# Patient Record
Sex: Female | Born: 1985 | Race: Black or African American | Hispanic: No | Marital: Single | Smoking: Never smoker
Health system: Southern US, Community
[De-identification: ages and names within clinical notes are randomized; demographics above are authoritative.]

## PROBLEM LIST (undated history)

## (undated) ENCOUNTER — Emergency Department (HOSPITAL_COMMUNITY): Discharge status: Absent without leave | Payer: Self-pay

## (undated) DIAGNOSIS — B3731 Acute candidiasis of vulva and vagina: Secondary | ICD-10-CM

## (undated) DIAGNOSIS — N63 Unspecified lump in unspecified breast: Secondary | ICD-10-CM

## (undated) DIAGNOSIS — T7840XA Allergy, unspecified, initial encounter: Secondary | ICD-10-CM

## (undated) DIAGNOSIS — R87629 Unspecified abnormal cytological findings in specimens from vagina: Secondary | ICD-10-CM

## (undated) DIAGNOSIS — K047 Periapical abscess without sinus: Secondary | ICD-10-CM

## (undated) DIAGNOSIS — J40 Bronchitis, not specified as acute or chronic: Secondary | ICD-10-CM

## (undated) DIAGNOSIS — B999 Unspecified infectious disease: Secondary | ICD-10-CM

## (undated) DIAGNOSIS — F32A Depression, unspecified: Secondary | ICD-10-CM

## (undated) DIAGNOSIS — F329 Major depressive disorder, single episode, unspecified: Secondary | ICD-10-CM

## (undated) DIAGNOSIS — R519 Headache, unspecified: Secondary | ICD-10-CM

## (undated) DIAGNOSIS — R51 Headache: Secondary | ICD-10-CM

## (undated) DIAGNOSIS — N760 Acute vaginitis: Principal | ICD-10-CM

## (undated) DIAGNOSIS — B373 Candidiasis of vulva and vagina: Secondary | ICD-10-CM

## (undated) DIAGNOSIS — B9689 Other specified bacterial agents as the cause of diseases classified elsewhere: Secondary | ICD-10-CM

## (undated) DIAGNOSIS — J302 Other seasonal allergic rhinitis: Secondary | ICD-10-CM

## (undated) DIAGNOSIS — B379 Candidiasis, unspecified: Secondary | ICD-10-CM

## (undated) DIAGNOSIS — F419 Anxiety disorder, unspecified: Secondary | ICD-10-CM

## (undated) DIAGNOSIS — H5789 Other specified disorders of eye and adnexa: Secondary | ICD-10-CM

## (undated) HISTORY — PX: WISDOM TOOTH EXTRACTION: SHX21

## (undated) HISTORY — PX: HIP SURGERY: SHX245

## (undated) HISTORY — DX: Allergy, unspecified, initial encounter: T78.40XA

---
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
## 2006-06-07 ENCOUNTER — Emergency Department (HOSPITAL_COMMUNITY): Admission: EM | Admit: 2006-06-07 | Discharge: 2006-06-07 | Payer: Self-pay | Admitting: Family Medicine

## 2006-08-16 ENCOUNTER — Emergency Department (HOSPITAL_COMMUNITY): Admission: EM | Admit: 2006-08-16 | Discharge: 2006-08-16 | Payer: Self-pay | Admitting: Emergency Medicine

## 2006-10-03 ENCOUNTER — Emergency Department (HOSPITAL_COMMUNITY): Admission: EM | Admit: 2006-10-03 | Discharge: 2006-10-03 | Payer: Self-pay | Admitting: Family Medicine

## 2006-11-24 ENCOUNTER — Emergency Department (HOSPITAL_COMMUNITY): Admission: EM | Admit: 2006-11-24 | Discharge: 2006-11-25 | Payer: Self-pay | Admitting: Emergency Medicine

## 2006-12-28 ENCOUNTER — Emergency Department (HOSPITAL_COMMUNITY): Admission: EM | Admit: 2006-12-28 | Discharge: 2006-12-28 | Payer: Self-pay | Admitting: Emergency Medicine

## 2007-04-10 ENCOUNTER — Emergency Department (HOSPITAL_COMMUNITY): Admission: EM | Admit: 2007-04-10 | Discharge: 2007-04-10 | Payer: Self-pay | Admitting: Emergency Medicine

## 2007-04-19 ENCOUNTER — Emergency Department (HOSPITAL_COMMUNITY): Admission: EM | Admit: 2007-04-19 | Discharge: 2007-04-19 | Payer: Self-pay | Admitting: Family Medicine

## 2007-05-07 ENCOUNTER — Emergency Department (HOSPITAL_COMMUNITY): Admission: EM | Admit: 2007-05-07 | Discharge: 2007-05-07 | Payer: Self-pay | Admitting: Family Medicine

## 2007-12-11 ENCOUNTER — Emergency Department (HOSPITAL_COMMUNITY): Admission: EM | Admit: 2007-12-11 | Discharge: 2007-12-11 | Payer: Self-pay | Admitting: Family Medicine

## 2007-12-27 ENCOUNTER — Emergency Department (HOSPITAL_COMMUNITY): Admission: EM | Admit: 2007-12-27 | Discharge: 2007-12-27 | Payer: Self-pay | Admitting: Family Medicine

## 2008-03-17 ENCOUNTER — Emergency Department (HOSPITAL_COMMUNITY): Admission: EM | Admit: 2008-03-17 | Discharge: 2008-03-17 | Payer: Self-pay | Admitting: Family Medicine

## 2008-04-29 ENCOUNTER — Inpatient Hospital Stay (HOSPITAL_COMMUNITY): Admission: AD | Admit: 2008-04-29 | Discharge: 2008-04-29 | Payer: Self-pay | Admitting: Obstetrics

## 2008-06-16 ENCOUNTER — Emergency Department (HOSPITAL_COMMUNITY): Admission: EM | Admit: 2008-06-16 | Discharge: 2008-06-16 | Payer: Self-pay | Admitting: Emergency Medicine

## 2008-07-16 ENCOUNTER — Emergency Department (HOSPITAL_COMMUNITY): Admission: EM | Admit: 2008-07-16 | Discharge: 2008-07-16 | Payer: Self-pay | Admitting: Family Medicine

## 2008-10-02 ENCOUNTER — Inpatient Hospital Stay (HOSPITAL_COMMUNITY): Admission: AD | Admit: 2008-10-02 | Discharge: 2008-10-02 | Payer: Self-pay | Admitting: Obstetrics & Gynecology

## 2009-04-19 ENCOUNTER — Emergency Department (HOSPITAL_COMMUNITY): Admission: EM | Admit: 2009-04-19 | Discharge: 2009-04-19 | Payer: Self-pay | Admitting: Family Medicine

## 2009-07-21 ENCOUNTER — Emergency Department (HOSPITAL_COMMUNITY): Admission: EM | Admit: 2009-07-21 | Discharge: 2009-07-21 | Payer: Self-pay | Admitting: Emergency Medicine

## 2009-10-19 ENCOUNTER — Emergency Department (HOSPITAL_COMMUNITY): Admission: EM | Admit: 2009-10-19 | Discharge: 2009-10-19 | Payer: Self-pay | Admitting: Emergency Medicine

## 2009-12-14 ENCOUNTER — Emergency Department (HOSPITAL_COMMUNITY): Admission: EM | Admit: 2009-12-14 | Discharge: 2009-12-14 | Payer: Self-pay | Admitting: Family Medicine

## 2010-02-05 ENCOUNTER — Emergency Department (HOSPITAL_COMMUNITY): Admission: EM | Admit: 2010-02-05 | Discharge: 2010-02-05 | Payer: Self-pay | Admitting: Emergency Medicine

## 2010-07-02 ENCOUNTER — Emergency Department (HOSPITAL_COMMUNITY)
Admission: EM | Admit: 2010-07-02 | Discharge: 2010-07-02 | Payer: Self-pay | Source: Home / Self Care | Admitting: Emergency Medicine

## 2010-07-09 ENCOUNTER — Inpatient Hospital Stay (HOSPITAL_COMMUNITY)
Admission: AD | Admit: 2010-07-09 | Discharge: 2010-07-09 | Payer: Self-pay | Source: Home / Self Care | Attending: Obstetrics & Gynecology | Admitting: Obstetrics & Gynecology

## 2010-07-10 ENCOUNTER — Ambulatory Visit: Admit: 2010-07-10 | Payer: Self-pay

## 2010-08-15 ENCOUNTER — Other Ambulatory Visit: Payer: Self-pay | Admitting: Family Medicine

## 2010-08-15 ENCOUNTER — Encounter (INDEPENDENT_AMBULATORY_CARE_PROVIDER_SITE_OTHER): Payer: Self-pay | Admitting: Family Medicine

## 2010-08-15 ENCOUNTER — Other Ambulatory Visit (HOSPITAL_COMMUNITY)
Admission: RE | Admit: 2010-08-15 | Discharge: 2010-08-15 | Disposition: A | Discharge status: Home / Self Care | Payer: Self-pay | Source: Ambulatory Visit | Attending: Family Medicine | Admitting: Family Medicine

## 2010-08-15 DIAGNOSIS — Z01419 Encounter for gynecological examination (general) (routine) without abnormal findings: Secondary | ICD-10-CM

## 2010-08-15 DIAGNOSIS — B977 Papillomavirus as the cause of diseases classified elsewhere: Secondary | ICD-10-CM | POA: Insufficient documentation

## 2010-08-15 DIAGNOSIS — N841 Polyp of cervix uteri: Secondary | ICD-10-CM

## 2010-08-15 DIAGNOSIS — N870 Mild cervical dysplasia: Secondary | ICD-10-CM | POA: Insufficient documentation

## 2010-08-15 DIAGNOSIS — N893 Dysplasia of vagina, unspecified: Secondary | ICD-10-CM | POA: Insufficient documentation

## 2010-08-15 LAB — POCT PREGNANCY, URINE: Preg Test, Ur: NEGATIVE

## 2010-09-09 ENCOUNTER — Encounter (INDEPENDENT_AMBULATORY_CARE_PROVIDER_SITE_OTHER): Payer: Self-pay | Admitting: Obstetrics & Gynecology

## 2010-09-09 ENCOUNTER — Other Ambulatory Visit (HOSPITAL_COMMUNITY)
Admission: RE | Admit: 2010-09-09 | Discharge: 2010-09-09 | Disposition: A | Discharge status: Home / Self Care | Payer: Self-pay | Source: Ambulatory Visit | Attending: Family Medicine | Admitting: Family Medicine

## 2010-09-09 ENCOUNTER — Other Ambulatory Visit: Payer: Self-pay | Admitting: Obstetrics & Gynecology

## 2010-09-09 DIAGNOSIS — R87610 Atypical squamous cells of undetermined significance on cytologic smear of cervix (ASC-US): Secondary | ICD-10-CM

## 2010-09-09 DIAGNOSIS — N870 Mild cervical dysplasia: Secondary | ICD-10-CM | POA: Insufficient documentation

## 2010-09-09 LAB — POCT PREGNANCY, URINE: Preg Test, Ur: NEGATIVE

## 2010-09-10 ENCOUNTER — Other Ambulatory Visit: Payer: Self-pay | Admitting: Obstetrics & Gynecology

## 2010-09-16 LAB — POCT URINALYSIS DIPSTICK
Bilirubin Urine: NEGATIVE
Specific Gravity, Urine: >=1.03 (ref 1.005–1.030)

## 2010-09-16 LAB — POCT PREGNANCY, URINE: Preg Test, Ur: NEGATIVE

## 2010-09-16 LAB — WET PREP, GENITAL

## 2010-09-16 LAB — GC/CHLAMYDIA PROBE AMP, GENITAL: GC Probe Amp, Genital: NEGATIVE

## 2010-09-22 LAB — WET PREP, GENITAL: Trich, Wet Prep: NONE SEEN

## 2010-09-22 LAB — URINALYSIS, ROUTINE W REFLEX MICROSCOPIC
Glucose, UA: NEGATIVE mg/dL
Urobilinogen, UA: 0.2 mg/dL (ref 0.0–1.0)
pH: 6 (ref 5.0–8.0)

## 2010-09-22 LAB — URINE MICROSCOPIC-ADD ON

## 2010-09-22 LAB — GC/CHLAMYDIA PROBE AMP, GENITAL
Chlamydia, DNA Probe: NEGATIVE
GC Probe Amp, Genital: NEGATIVE

## 2010-09-23 LAB — WET PREP, GENITAL
Trich, Wet Prep: NONE SEEN
Yeast Wet Prep HPF POC: NONE SEEN

## 2010-09-23 LAB — GC/CHLAMYDIA PROBE AMP, GENITAL: Chlamydia, DNA Probe: NEGATIVE

## 2010-09-23 LAB — POCT URINALYSIS DIP (DEVICE)
Bilirubin Urine: NEGATIVE
Glucose, UA: NEGATIVE mg/dL

## 2010-09-23 LAB — POCT PREGNANCY, URINE: Preg Test, Ur: NEGATIVE

## 2010-09-25 ENCOUNTER — Ambulatory Visit: Payer: Self-pay | Admitting: Obstetrics and Gynecology

## 2010-09-27 NOTE — Progress Notes (Signed)
NAME:  Brenda West, Brenda West NO.:  [id number]  MEDICAL RECORD NO.:  [id number]           PATIENT TYPE:  A  LOCATION:  [facility]                   FACILITY:  [facility]  PHYSICIAN:  Tinnie Gens, MD        DATE OF BIRTH:  June 03, 1986  DATE OF SERVICE:  08/15/2010                                 CLINIC NOTE  CHIEF COMPLAINT:  Cervical polyp.  HISTORY OF PRESENT ILLNESS:  The patient is a 25 year old nulliparous patient who comes in today for cervical polyp.  She states she was seen at the [facility] and found to have a cervical polyp and she was told to come here to have it removed.  She is, otherwise, without complaints.  She does have some irregular cycles since she got off Depo approximately 2 years ago, and she was amenorrheic for quite some time prior to that.  She currently uses condoms for birth control and is not interested in anything more than that.  PAST MEDICAL HISTORY:  Significant for allergic rhinitis, eczema, and bacterial vaginosis.  FAMILY HISTORY:  History of hypertension and cirrhosis.  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  They got treated at age 42.  She denies drug use.  She is a social alcohol user and a former smoker.  She is currently trying to get disability.  MEDICATIONS:  The patient is on Zyrtec and triamcinolone.  ALLERGIES:  None known.  OBSTETRICAL HISTORY:  G0.  GYNECOLOGICAL HISTORY:  No history of abnormal Pap smear.  PHYSICAL EXAMINATION:  VITAL SIGN:  As noted in the chart. GENERAL:  She is a well-developed, well-nourished female in no acute distress. HEENT:  Normocephalic, atraumatic.  Sclerae anicteric. NECK:  Supple.  Normal thyroid. LUNGS:  Clear. CV:  Regular.  ABDOMEN:  Soft, nontender, nondistended. GU:  Normal external female genitalia.  BUS normal.  Vagina is pink and rugated.  Cervix is nulliparous.  There is a fleshy polyp coming from the endocervix.  A Pap smear is  obtained.  PROCEDURE:  Cervical polyp was grasped with ring forceps and twisted till it is removed.  The patient tolerated these procedure well.  IMPRESSION: 1. Gynecologic exam with Pap. 2. Cervical polyp status post removal.  PLAN: 1. Pap smear today. 2. Follow up here as needed.  Condoms advised for birth control.          ______________________________ Tinnie Gens, MD    TP/MEDQ  D:  08/15/2010  T:  08/16/2010  Job:  [id number]

## 2010-10-10 LAB — POCT URINALYSIS DIP (DEVICE)
Bilirubin Urine: NEGATIVE
Glucose, UA: NEGATIVE mg/dL
Ketones, ur: NEGATIVE mg/dL
Urobilinogen, UA: 0.2 mg/dL (ref 0.0–1.0)

## 2010-10-10 LAB — WET PREP, GENITAL
Trich, Wet Prep: NONE SEEN
Yeast Wet Prep HPF POC: NONE SEEN

## 2010-10-10 LAB — POCT PREGNANCY, URINE: Preg Test, Ur: NEGATIVE

## 2010-10-10 LAB — GC/CHLAMYDIA PROBE AMP, GENITAL
Chlamydia, DNA Probe: NEGATIVE
GC Probe Amp, Genital: NEGATIVE

## 2010-10-17 LAB — GC/CHLAMYDIA PROBE AMP, GENITAL: GC Probe Amp, Genital: NEGATIVE

## 2010-10-17 LAB — WET PREP, GENITAL: Yeast Wet Prep HPF POC: NONE SEEN

## 2010-10-21 LAB — POCT URINALYSIS DIP (DEVICE)
Specific Gravity, Urine: 1.025 (ref 1.005–1.030)
pH: 6 (ref 5.0–8.0)

## 2010-11-27 ENCOUNTER — Ambulatory Visit: Payer: Self-pay | Admitting: Obstetrics and Gynecology

## 2010-12-03 ENCOUNTER — Inpatient Hospital Stay (INDEPENDENT_AMBULATORY_CARE_PROVIDER_SITE_OTHER)
Admission: RE | Admit: 2010-12-03 | Discharge: 2010-12-03 | Disposition: A | Discharge status: Home / Self Care | Payer: Self-pay | Source: Ambulatory Visit | Attending: Family Medicine | Admitting: Family Medicine

## 2010-12-03 DIAGNOSIS — N760 Acute vaginitis: Secondary | ICD-10-CM

## 2010-12-03 DIAGNOSIS — N390 Urinary tract infection, site not specified: Secondary | ICD-10-CM

## 2010-12-03 LAB — WET PREP, GENITAL: Clue Cells Wet Prep HPF POC: NONE SEEN

## 2010-12-03 LAB — GC/CHLAMYDIA PROBE AMP, GENITAL
Chlamydia, DNA Probe: NEGATIVE
GC Probe Amp, Genital: NEGATIVE

## 2010-12-04 LAB — POCT URINALYSIS DIP (DEVICE)
Ketones, ur: NEGATIVE mg/dL
Protein, ur: NEGATIVE mg/dL
Specific Gravity, Urine: 1.025 (ref 1.005–1.030)
pH: 6.5 (ref 5.0–8.0)

## 2010-12-04 LAB — POCT PREGNANCY, URINE: Preg Test, Ur: NEGATIVE

## 2010-12-18 ENCOUNTER — Ambulatory Visit (HOSPITAL_COMMUNITY)
Admission: RE | Admit: 2010-12-18 | Discharge: 2010-12-18 | Disposition: A | Discharge status: Home / Self Care | Payer: PRIVATE HEALTH INSURANCE | Attending: Psychiatry | Admitting: Psychiatry

## 2010-12-18 ENCOUNTER — Ambulatory Visit: Payer: Self-pay | Admitting: Obstetrics and Gynecology

## 2010-12-18 DIAGNOSIS — F332 Major depressive disorder, recurrent severe without psychotic features: Secondary | ICD-10-CM | POA: Insufficient documentation

## 2010-12-19 ENCOUNTER — Emergency Department (HOSPITAL_COMMUNITY)
Admission: EM | Admit: 2010-12-19 | Discharge: 2010-12-19 | Disposition: A | Discharge status: Other Acute Inpatient Hospital | Payer: Self-pay | Attending: Emergency Medicine | Admitting: Emergency Medicine

## 2010-12-19 ENCOUNTER — Inpatient Hospital Stay (HOSPITAL_COMMUNITY)
Admission: AD | Admit: 2010-12-19 | Discharge: 2010-12-20 | DRG: 885 | Disposition: A | Discharge status: Home / Self Care | Payer: PRIVATE HEALTH INSURANCE | Source: Ambulatory Visit | Attending: Psychiatry | Admitting: Psychiatry

## 2010-12-19 DIAGNOSIS — R45851 Suicidal ideations: Secondary | ICD-10-CM | POA: Insufficient documentation

## 2010-12-19 DIAGNOSIS — F330 Major depressive disorder, recurrent, mild: Principal | ICD-10-CM

## 2010-12-19 DIAGNOSIS — F341 Dysthymic disorder: Secondary | ICD-10-CM | POA: Insufficient documentation

## 2010-12-19 DIAGNOSIS — F410 Panic disorder [episodic paroxysmal anxiety] without agoraphobia: Secondary | ICD-10-CM

## 2010-12-19 DIAGNOSIS — M25559 Pain in unspecified hip: Secondary | ICD-10-CM

## 2010-12-19 LAB — URINALYSIS, ROUTINE W REFLEX MICROSCOPIC
Bilirubin Urine: NEGATIVE
Glucose, UA: 100 mg/dL — AB
Nitrite: NEGATIVE
Specific Gravity, Urine: 1.024 (ref 1.005–1.030)
pH: 6 (ref 5.0–8.0)

## 2010-12-19 LAB — COMPREHENSIVE METABOLIC PANEL
ALT: 6 U/L (ref 0–35)
AST: 10 U/L (ref 0–37)
Albumin: 3.7 g/dL (ref 3.5–5.2)
Alkaline Phosphatase: 49 U/L (ref 39–117)
Potassium: 3.9 mEq/L (ref 3.5–5.1)
Sodium: 140 mEq/L (ref 135–145)
Total Protein: 7.4 g/dL (ref 6.0–8.3)

## 2010-12-19 LAB — URINE MICROSCOPIC-ADD ON

## 2010-12-19 LAB — CBC
MCHC: 34.1 g/dL (ref 30.0–36.0)
Platelets: 319 10*3/uL (ref 150–400)
RDW: 13.2 % (ref 11.5–15.5)
WBC: 9.9 10*3/uL (ref 4.0–10.5)

## 2010-12-19 LAB — DIFFERENTIAL
Basophils Absolute: 0 10*3/uL (ref 0.0–0.1)
Basophils Relative: 0 % (ref 0–1)
Eosinophils Relative: 1 % (ref 0–5)
Monocytes Absolute: 0.6 10*3/uL (ref 0.1–1.0)

## 2010-12-19 LAB — RAPID URINE DRUG SCREEN, HOSP PERFORMED
Opiates: NOT DETECTED
Tetrahydrocannabinol: NOT DETECTED

## 2010-12-19 LAB — ETHANOL: Alcohol, Ethyl (B): <11 mg/dL — ABNORMAL HIGH (ref 0–10)

## 2010-12-20 DIAGNOSIS — F320 Major depressive disorder, single episode, mild: Secondary | ICD-10-CM

## 2010-12-20 LAB — HEPATITIS PANEL, ACUTE
Hep B C IgM: NEGATIVE
Hepatitis B Surface Ag: NEGATIVE

## 2010-12-20 LAB — HIV ANTIBODY (ROUTINE TESTING W REFLEX): HIV: NONREACTIVE

## 2010-12-23 NOTE — H&P (Signed)
NAME:  Brenda West, Brenda West NO.:  [id number]  MEDICAL RECORD NO.:  [id number]  LOCATION:                                FACILITY:  [facility]  PHYSICIAN:  Franchot Gallo, MD          DATE OF BIRTH:  DATE OF ADMISSION:  12/19/2010 DATE OF DISCHARGE:                      PSYCHIATRIC ADMISSION ASSESSMENT   IDENTIFYING INFORMATION:  This is a 25 year old female who presented as a walk-in to the [facility], coming here because she did not know where to turn.  She felt this was a facility that could help her out with her disability and housing.  The patient reports having stressors in her life.  She has gotten evicted from her home, is having problems with car, transportation issues.  She was feeling depressed and having some symptoms of anxiety but was denying any suicidal thoughts.  PAST PSYCHIATRIC HISTORY:  First admission to [facility]. Has a therapist named Editor, commissioning at [facility], has been seeing her for approximately 3 to 4 months.  SOCIAL HISTORY:  The patient is a Consulting civil engineer at [employer], studying business and accounting.  She is single, has no children.  She is seeking disability for her hip pain and depression and anxiety. Currently living with a family member in [place].  FAMILY HISTORY:  None.  ALCOHOL AND DRUG HISTORY:  She denies any alcohol or substance use.  PRIMARY CARE PROVIDER:  Uses Urgent Care.  MEDICAL PROBLEMS:  History of hip pain.  Reports having surgery on her hip when she was 25 years old.  History of eczema.  MEDICATIONS:  None.  DRUG ALLERGIES:  No known allergies.  PHYSICAL EXAMINATION:  This is a normally-developed, well-nourished female.  She was assessed in the emergency department.  Her laboratory data shows a CBC within normal limits.  Urine drug screen is negative.  Alcohol level less than 11.  Hemoglobin 11.7, hematocrit 34.3.  MENTAL STATUS EXAM:  The  patient is fully alert and cooperative.  She is dressed in her own clothing, casual wear.  She has good eye contact. Her speech is clear and articulate.  She provides a good history about her circumstances and what she was seeking when she came to our facility.  She denies any suicidal or homicidal thoughts.  Her mood is neutral.  She is hopeful to go to a family dinner.  Her thought processes are coherent, goal directed.  No evidence of any psychotic symptoms.  Cognitive function is intact.  Her memory is intact.  Her judgment and insight appear to be good.  AXIS I:  Major depressive disorder, mild. AXIS II:  Deferred. AXIS III:  History of hip pain and eczema by history. AXIS IV:  Problems with housing and seeking disability and possible other psychosocial problems. AXIS V:  Current is 55-60.  Our plan is to contact family for support and safety issues and case management to obtain a followup appointment with her therapist.  Her tentative length of stay at this time is 1 to 2 days.     Landry Corporal, N.P.   ______________________________ Franchot Gallo, MD    JO/MEDQ  D:  12/20/2010  T:  12/20/2010  Job:  [id number]  Electronically Signed by Limmie Patricia.P. on 12/23/2010 02:47:21 PM Electronically Signed by Franchot Gallo MD on 12/23/2010 05:35:35 PM

## 2011-04-03 LAB — POCT URINALYSIS DIP (DEVICE)
Nitrite: NEGATIVE
pH: 5.5

## 2011-04-03 LAB — WET PREP, GENITAL

## 2011-04-03 LAB — POCT PREGNANCY, URINE: Preg Test, Ur: NEGATIVE

## 2011-04-03 LAB — GC/CHLAMYDIA PROBE AMP, GENITAL: Chlamydia, DNA Probe: NEGATIVE

## 2011-04-08 LAB — DIFFERENTIAL
Lymphs Abs: 2.9
Monocytes Relative: 7
Neutro Abs: 5.8
Neutrophils Relative %: 61

## 2011-04-08 LAB — URINALYSIS, ROUTINE W REFLEX MICROSCOPIC
Bilirubin Urine: NEGATIVE
Ketones, ur: NEGATIVE
Nitrite: NEGATIVE
Specific Gravity, Urine: 1.02
Urobilinogen, UA: 0.2

## 2011-04-08 LAB — GC/CHLAMYDIA PROBE AMP, GENITAL: GC Probe Amp, Genital: NEGATIVE

## 2011-04-08 LAB — CBC
RBC: 4.75
WBC: 9.5

## 2011-04-08 LAB — WET PREP, GENITAL

## 2011-04-08 LAB — POCT PREGNANCY, URINE: Preg Test, Ur: NEGATIVE

## 2011-04-09 LAB — POCT URINALYSIS DIP (DEVICE)
Bilirubin Urine: NEGATIVE
Glucose, UA: NEGATIVE
Nitrite: NEGATIVE
Specific Gravity, Urine: >=1.03

## 2011-04-09 LAB — GC/CHLAMYDIA PROBE AMP, GENITAL
Chlamydia, DNA Probe: NEGATIVE
GC Probe Amp, Genital: NEGATIVE

## 2011-04-09 LAB — WET PREP, GENITAL: Trich, Wet Prep: NONE SEEN

## 2011-04-11 LAB — WET PREP, GENITAL
Clue Cells Wet Prep HPF POC: NONE SEEN
Trich, Wet Prep: NONE SEEN

## 2011-04-11 LAB — POCT URINALYSIS DIP (DEVICE)
Nitrite: NEGATIVE
Specific Gravity, Urine: 1.025 (ref 1.005–1.030)
pH: 6.5 (ref 5.0–8.0)

## 2011-04-16 LAB — POCT URINALYSIS DIP (DEVICE)
Ketones, ur: NEGATIVE
Protein, ur: NEGATIVE
Specific Gravity, Urine: 1.025
pH: 5.5

## 2011-04-16 LAB — WET PREP, GENITAL
Clue Cells Wet Prep HPF POC: NONE SEEN
Trich, Wet Prep: NONE SEEN

## 2011-04-23 LAB — POCT URINALYSIS DIP (DEVICE)
Glucose, UA: NEGATIVE
Nitrite: NEGATIVE
Operator id: 235561
Urobilinogen, UA: 1

## 2011-04-23 LAB — POCT PREGNANCY, URINE
Operator id: 235561
Preg Test, Ur: NEGATIVE

## 2011-04-23 LAB — WET PREP, GENITAL: Trich, Wet Prep: NONE SEEN

## 2011-05-10 ENCOUNTER — Inpatient Hospital Stay (INDEPENDENT_AMBULATORY_CARE_PROVIDER_SITE_OTHER)
Admission: RE | Admit: 2011-05-10 | Discharge: 2011-05-10 | Disposition: A | Discharge status: Home / Self Care | Payer: Self-pay | Source: Ambulatory Visit | Attending: Family Medicine | Admitting: Family Medicine

## 2011-05-10 DIAGNOSIS — N760 Acute vaginitis: Secondary | ICD-10-CM

## 2011-05-10 DIAGNOSIS — J069 Acute upper respiratory infection, unspecified: Secondary | ICD-10-CM

## 2011-05-10 DIAGNOSIS — N390 Urinary tract infection, site not specified: Secondary | ICD-10-CM

## 2011-05-10 LAB — POCT URINALYSIS DIP (DEVICE)
Bilirubin Urine: NEGATIVE
Glucose, UA: 100 mg/dL — AB
Nitrite: POSITIVE — AB
Specific Gravity, Urine: 1.025 (ref 1.005–1.030)

## 2011-05-10 LAB — WET PREP, GENITAL
Trich, Wet Prep: NONE SEEN
Yeast Wet Prep HPF POC: NONE SEEN

## 2011-05-12 LAB — GC/CHLAMYDIA PROBE AMP, GENITAL: GC Probe Amp, Genital: NEGATIVE

## 2011-05-13 NOTE — ED Notes (Signed)
GC neg., Chlamydia neg., Wet prep: Mod. Clue cells, mod. WBC's. Pt. adequately treated with Flagyl.

## 2011-07-11 ENCOUNTER — Emergency Department (INDEPENDENT_AMBULATORY_CARE_PROVIDER_SITE_OTHER)
Admission: EM | Admit: 2011-07-11 | Discharge: 2011-07-11 | Disposition: A | Discharge status: Home / Self Care | Payer: Self-pay | Source: Home / Self Care | Attending: Family Medicine | Admitting: Family Medicine

## 2011-07-11 DIAGNOSIS — F419 Anxiety disorder, unspecified: Secondary | ICD-10-CM

## 2011-07-11 DIAGNOSIS — R51 Headache: Secondary | ICD-10-CM

## 2011-07-11 DIAGNOSIS — F411 Generalized anxiety disorder: Secondary | ICD-10-CM

## 2011-07-11 DIAGNOSIS — N390 Urinary tract infection, site not specified: Secondary | ICD-10-CM

## 2011-07-11 LAB — POCT URINALYSIS DIP (DEVICE)
Ketones, ur: NEGATIVE mg/dL
Leukocytes, UA: NEGATIVE
Protein, ur: NEGATIVE mg/dL
pH: 6 (ref 5.0–8.0)

## 2011-07-11 MED ORDER — CIPROFLOXACIN HCL 500 MG PO TABS: ORAL_TABLET | ORAL | Status: DC

## 2011-07-11 MED ORDER — IBUPROFEN 600 MG PO TABS: 600.0000 mg | ORAL_TABLET | Freq: Three times a day (TID) | ORAL | Status: AC | PRN

## 2011-07-11 MED ORDER — HYDROXYZINE HCL 50 MG PO TABS: 50.0000 mg | ORAL_TABLET | Freq: Three times a day (TID) | ORAL | Status: AC | PRN

## 2011-07-11 NOTE — ED Notes (Signed)
C/o chills, headache and cough for 2 weeks.  Denies runny nose, or sore throat.  Also c/o urinary frequency with lower abdominal pressure for 1 week.

## 2011-07-11 NOTE — ED Provider Notes (Signed)
History     CSN: [id number]  Arrival date & time 07/11/11  2956   First MD Initiated Contact with Patient 07/11/11 2022      Chief Complaint  Patient presents with  . Headache  . Urinary Frequency    (Consider location/radiation/quality/duration/timing/severity/associated sxs/prior treatment) HPI Comments: 26 y/o female with reported history of mood disorder. Here c/o of different non specific symptoms like body aches, headache and chills for about 2 week associated with cough non productive, states cough is almost resolved but still feels general malaise that she has difficulty describing, denies chest pain, difficulty breathing, shortness of breath or fever.  Also reports she had urinary frequency and low abdominal pressure that lasted one week but denies burning on urination, frequency, hematuria, pelvic or flank pain or vaginal discharge currently. Also denies nausea vomiting or diarrhea. Also states she needs something for anxiety as is going thorough a lot of stress at work and home about to be evicted from her house.  Denies illicit drug use. About to start with her menstrual cycle.   History reviewed. No pertinent past medical history.  Past Surgical History  Procedure Date  . Hip surgery     No family history on file.  History  Substance Use Topics  . Smoking status: Never Smoker   . Smokeless tobacco: Not on file  . Alcohol Use: Yes     Occasional    OB History    Grav Para Term Preterm Abortions TAB SAB Ect Mult Living                  Review of Systems  Constitutional: Positive for chills. Negative for fever and unexpected weight change.  HENT: Negative for congestion, sore throat, rhinorrhea, trouble swallowing, neck pain and neck stiffness.   Respiratory: Positive for cough. Negative for shortness of breath and wheezing.   Cardiovascular: Negative for chest pain, palpitations and leg swelling.  Gastrointestinal: Negative for nausea, vomiting, abdominal  pain and diarrhea.  Genitourinary: Negative for dysuria, urgency, frequency, hematuria, flank pain, vaginal bleeding, vaginal discharge, difficulty urinating and pelvic pain.  Skin: Negative for rash.  Neurological: Positive for headaches. Negative for dizziness.    Allergies  Review of patient's allergies indicates no known allergies.  Home Medications   Current Outpatient Rx  Name Route Sig Dispense Refill  . CIPROFLOXACIN HCL 500 MG PO TABS  1 tap po bid for 3 days 6 tablet 0  . HYDROXYZINE HCL 50 MG PO TABS Oral Take 1 tablet (50 mg total) by mouth every 8 (eight) hours as needed for anxiety. 20 tablet 0  . IBUPROFEN 600 MG PO TABS Oral Take 1 tablet (600 mg total) by mouth every 8 (eight) hours as needed for pain or fever. 20 tablet 0    BP 125/81  Pulse 79  Temp(Src) 99 F (37.2 C) (Oral)  Resp 16  SpO2 100%  LMP 06/12/2011  Physical Exam  Nursing note and vitals reviewed. Constitutional: She is oriented to person, place, and time. She appears well-developed and well-nourished. No distress.       Laying in bed, afebrile in no distress.  HENT:  Head: Normocephalic and atraumatic.  Eyes: Conjunctivae and EOM are normal. Pupils are equal, round, and reactive to light. Right eye exhibits no discharge. Left eye exhibits no discharge. No scleral icterus.  Neck: Normal range of motion. Neck supple. No thyromegaly present.  Cardiovascular: Normal rate, regular rhythm and normal heart sounds.   Pulmonary/Chest: Effort normal and  breath sounds normal. No respiratory distress. She has no wheezes. She exhibits no tenderness.  Abdominal: Soft. Bowel sounds are normal. She exhibits no distension and no mass. There is no tenderness. There is no rebound and no guarding.       No CVT  Lymphadenopathy:    She has no cervical adenopathy.  Neurological: She is alert and oriented to person, place, and time. No cranial nerve deficit.       No kernig or Brudzinski.  Skin: No rash noted.    Psychiatric: Judgment and thought content normal.       Anxious with several non specific complaints and inconsistency in her report of symptoms.     ED Course  Procedures (including critical care time)  Labs Reviewed  POCT URINALYSIS DIP (DEVICE) - Abnormal; Notable for the following:    Hgb urine dipstick MODERATE (*)    All other components within normal limits  POCT PREGNANCY, URINE  LAB REPORT - SCANNED   No results found.   1. Headache   2. Anxiety   3. UTI (lower urinary tract infection)       MDM  Afebrile, non toxic. Treated with Cipro for possible uti. Hydroxyzine and ibuprofen.        Sharin Grave, MD 07/13/11 [phone]

## 2011-08-07 ENCOUNTER — Encounter (HOSPITAL_COMMUNITY): Payer: Self-pay | Admitting: Emergency Medicine

## 2011-08-07 ENCOUNTER — Emergency Department (INDEPENDENT_AMBULATORY_CARE_PROVIDER_SITE_OTHER)
Admission: EM | Admit: 2011-08-07 | Discharge: 2011-08-07 | Disposition: A | Discharge status: Home / Self Care | Payer: Self-pay | Source: Home / Self Care | Attending: Emergency Medicine | Admitting: Emergency Medicine

## 2011-08-07 DIAGNOSIS — N760 Acute vaginitis: Secondary | ICD-10-CM

## 2011-08-07 DIAGNOSIS — K089 Disorder of teeth and supporting structures, unspecified: Secondary | ICD-10-CM

## 2011-08-07 DIAGNOSIS — K0889 Other specified disorders of teeth and supporting structures: Secondary | ICD-10-CM

## 2011-08-07 DIAGNOSIS — A499 Bacterial infection, unspecified: Secondary | ICD-10-CM

## 2011-08-07 DIAGNOSIS — B9689 Other specified bacterial agents as the cause of diseases classified elsewhere: Secondary | ICD-10-CM

## 2011-08-07 HISTORY — DX: Other specified bacterial agents as the cause of diseases classified elsewhere: B96.89

## 2011-08-07 HISTORY — DX: Candidiasis of vulva and vagina: B37.3

## 2011-08-07 HISTORY — DX: Other specified bacterial agents as the cause of diseases classified elsewhere: N76.0

## 2011-08-07 HISTORY — DX: Anxiety disorder, unspecified: F41.9

## 2011-08-07 HISTORY — DX: Acute candidiasis of vulva and vagina: B37.31

## 2011-08-07 LAB — POCT URINALYSIS DIP (DEVICE)
Bilirubin Urine: NEGATIVE
Glucose, UA: NEGATIVE mg/dL
Leukocytes, UA: NEGATIVE
Nitrite: NEGATIVE
Protein, ur: NEGATIVE mg/dL
Specific Gravity, Urine: 1.025 (ref 1.005–1.030)
Urobilinogen, UA: 1 mg/dL (ref 0.0–1.0)
pH: 6 (ref 5.0–8.0)

## 2011-08-07 MED ORDER — LIDOCAINE VISCOUS 2 % MT SOLN: 10.0000 mL | OROMUCOSAL | Status: AC | PRN

## 2011-08-07 MED ORDER — AZITHROMYCIN 250 MG PO TABS: 1000.0000 mg | ORAL_TABLET | Freq: Once | ORAL | Status: DC

## 2011-08-07 MED ORDER — PENICILLIN V POTASSIUM 500 MG PO TABS: 500.0000 mg | ORAL_TABLET | Freq: Four times a day (QID) | ORAL | Status: AC

## 2011-08-07 MED ORDER — CEFTRIAXONE SODIUM 250 MG IJ SOLR: 250.0000 mg | Freq: Once | INTRAMUSCULAR | Status: DC

## 2011-08-07 MED ORDER — HYDROCODONE-ACETAMINOPHEN 5-325 MG PO TABS: ORAL_TABLET | ORAL | Status: AC

## 2011-08-07 MED ORDER — CEFTRIAXONE SODIUM 250 MG IJ SOLR
INTRAMUSCULAR | Status: AC
  Filled 2011-08-07: qty 250

## 2011-08-07 MED ORDER — AZITHROMYCIN 250 MG PO TABS
ORAL_TABLET | ORAL | Status: AC
  Filled 2011-08-07: qty 4

## 2011-08-07 MED ORDER — METRONIDAZOLE 0.75 % VA GEL: VAGINAL | Status: AC

## 2011-08-07 MED ORDER — CHLORHEXIDINE GLUCONATE 0.12 % MT SOLN: OROMUCOSAL | Status: AC

## 2011-08-07 NOTE — ED Provider Notes (Signed)
History     CSN: [id number]  Arrival date & time 08/07/11  1317   First MD Initiated Contact with Patient 08/07/11 1359      Chief Complaint  Patient presents with  . Dental Pain    (Consider location/radiation/quality/duration/timing/severity/associated sxs/prior treatment) HPI Comments: Patient here with 2 complaints today. First, patient complains of dysuria after having unprotected intercourse with her boyfriend. No vaginal bleeding, discharge, genital blisters, and vaginal itching. No abdominal pain, frequency, urgency, oderous or cloudy urine, hematuria. No back pain, fevers, rash. Reports an "odor" coming from her vagina. States this is similar to previous episodes of bacterial vaginosis. Patient also states she is sexually active with another female partner, but that they use condoms on a consistent basis. States that both of her partners are asymptomatic. STDs a concern today. Patient history of BV, yeast infections. No history of trich, gonorrhea, Chlamydia, herpes, syphilis, HIV.  Second, patient complains of 2 days of constant, dull, throbbing upper posterior right mouth pain. Reports some facial swelling. No nasal congestion, ear pain, fevers. Doesn't recall any trauma to her tooth or gums. No temperature sensitivity. States she thinks that her wisdom tooth coming in. Tried Tylenol 3, ibuprofen 800 mg, and Advil without relief.  ROS as noted in HPI. All other ROS negative.   Patient is a 26 y.o. female presenting with dysuria and tooth pain. The history is provided by the patient.  Dysuria  This is a new problem. The current episode started more than 2 days ago. The problem occurs every urination. The problem has been gradually improving. There has been no fever. She is sexually active. There is no history of pyelonephritis. Associated symptoms include discharge. Pertinent negatives include no chills, no sweats, no nausea, no vomiting, no frequency, no hematuria, no hesitancy, no  possible pregnancy, no urgency and no flank pain. She has tried nothing for the symptoms.  Dental PainThe primary symptoms include mouth pain and headaches. Primary symptoms do not include dental injury, oral bleeding, oral lesions, fever or sore throat. The symptoms began 2 days ago. The symptoms are unchanged.  Additional symptoms include: gum swelling, gum tenderness and facial swelling. Additional symptoms do not include: dental sensitivity to temperature, purulent gums, trismus, jaw pain and trouble swallowing. Medical issues do not include: smoking.    Past Medical History  Diagnosis Date  . Anxiety   . BV (bacterial vaginosis)   . Vaginal yeast infection     Past Surgical History  Procedure Date  . Hip surgery     History reviewed. No pertinent family history.  History  Substance Use Topics  . Smoking status: Never Smoker   . Smokeless tobacco: Not on file  . Alcohol Use: Yes     Occasional    OB History    Grav Para Term Preterm Abortions TAB SAB Ect Mult Living                  Review of Systems  Constitutional: Negative for fever and chills.  HENT: Positive for facial swelling. Negative for sore throat and trouble swallowing.   Gastrointestinal: Negative for nausea and vomiting.  Genitourinary: Positive for dysuria. Negative for hesitancy, urgency, frequency, hematuria and flank pain.  Neurological: Positive for headaches.    Allergies  Review of patient's allergies indicates no known allergies.  Home Medications   Current Outpatient Rx  Name Route Sig Dispense Refill  . CHLORHEXIDINE GLUCONATE 0.12 % MT SOLN  15 mL swish and spit bid 120 mL  0  . HYDROCODONE-ACETAMINOPHEN 5-325 MG PO TABS  1-2 tabs q 6hr prn pain 20 tablet 0  . LIDOCAINE VISCOUS 2 % MT SOLN Oral Take 10 mLs by mouth as needed for pain. Hold in mouth and spit. Do not swallow. 100 mL 0  . METRONIDAZOLE 0.75 % VA GEL  1 applicator full pv bid x 5 days 70 g 0  . PENICILLIN V POTASSIUM 500 MG  PO TABS Oral Take 1 tablet (500 mg total) by mouth 4 (four) times daily. X 10 days 40 tablet 0    BP 112/67  Pulse 78  Temp(Src) 98.5 F (36.9 C) (Oral)  Resp 16  SpO2 100%  LMP 07/13/2011  Physical Exam  Nursing note and vitals reviewed. Constitutional: She is oriented to person, place, and time. She appears well-developed and well-nourished.  HENT:  Head: Normocephalic and atraumatic.  Right Ear: Tympanic membrane normal.  Left Ear: Tympanic membrane normal.  Nose: Nose normal. Right sinus exhibits no maxillary sinus tenderness and no frontal sinus tenderness. Left sinus exhibits no maxillary sinus tenderness and no frontal sinus tenderness.  Mouth/Throat: Uvula is midline, oropharynx is clear and moist and mucous membranes are normal.    Eyes: Conjunctivae and EOM are normal.  Neck: Normal range of motion.  Cardiovascular: Normal rate.   Pulmonary/Chest: Effort normal.  Abdominal: Soft. Bowel sounds are normal. She exhibits no distension. There is no tenderness.  Genitourinary: Uterus normal. Pelvic exam was performed with patient supine. There is no rash on the right labia. There is no rash on the left labia. Cervix exhibits no motion tenderness and no friability. Right adnexum displays no mass and no tenderness. Left adnexum displays no mass and no tenderness. No erythema or bleeding around the vagina. No foreign body around the vagina. Vaginal discharge found.       Clear mucous from os. oderous thin white vaginal d/c. Chaperone present during exam.   Musculoskeletal: Normal range of motion.  Lymphadenopathy:    She has no cervical adenopathy.  Neurological: She is alert and oriented to person, place, and time.  Skin: Skin is warm and dry.  Psychiatric: She has a normal mood and affect. Her behavior is normal. Judgment and thought content normal.    ED Course  Procedures (including critical care time)  Labs Reviewed  POCT URINALYSIS DIP (DEVICE) - Abnormal; Notable for  the following:    Ketones, ur TRACE (*)    Hgb urine dipstick MODERATE (*)    All other components within normal limits  POCT PREGNANCY, URINE  WET PREP, GENITAL  GC/CHLAMYDIA PROBE AMP, GENITAL   No results found.   1. BV (bacterial vaginosis)   2. Pain, dental     MDM  Previous records and labs reviewed. As noted in history of present illness. Seen urgent care on 07/11/11 with mood disorder, urinary frequency and lower abdominal pain. Patient was diagnosed with headache, anxiety, and UTI. Had moderate hemoglobin on udip. Sent home with Cipro which pt states she did not fill. Patient with moderate hematuria on past 2 visits. Also history of vaginal yeast infections, BV. Patient has tested negative for gonorrhea and Chlamydia 5 times since 2011. No history of trichomoniasis.  H&P most c/w BV. Sent off GC/chlamydia, wet prep,  Will treat empirically now per pt request. Giving ceftriaxone 250 mg IM/azithro 1 gm po. Will send home with metrogel per pt request.  Advised pt to refrain from sexual contact until she  knows lab results, symptoms resolve, and  partner(s) are treated. Pt provided working phone number. Pt agrees.   D/w pt that she needs to have wisdom tooth removed by oral surgeon, will refer. Also with dental cary, home with pcn, supportive tx, and will refer to dentistry.  Informed by nurse that Pt declined rocephin and azithromycin    Luiz Blare, MD 08/08/11 [phone]

## 2011-08-07 NOTE — ED Notes (Signed)
PT HERE WITH RIGHT SIDE OF MOUTH PAIN RADIATING TO HEAD FROM TOOTHACHE THAT STARTED X 2 DYS AGO.DENIES DRAINAGE OR FEVERS WITH PAIN 10/10.PT ALSO REPORTS OF SEVERE H/A.PT STATES SHE NEEDS TO REAPPLY FOR ORANGE CARD.PT ALSO REQUESTING STD CHECK FROM POSIBLE UNPROTECTED SEX.NO VAG D/C

## 2011-08-15 ENCOUNTER — Telehealth (HOSPITAL_COMMUNITY): Payer: Self-pay | Admitting: *Deleted

## 2011-08-15 NOTE — ED Notes (Signed)
Pt. called back and said Dr. Frederik Pear office is closed  She said she does not have insurance. I gave her the number of [facility] and told her they might be cheaper. I told her it was in [place].  She the pain medication was not working. She asked for a refill of her pain medication. I told her we don't do refills of medication because we are not a primary care facility. She would have to come back and see the doctor or go to the ED.Vassie Moselle 08/15/2011

## 2011-08-15 NOTE — ED Notes (Signed)
Pt. called on VM and requests an oral surgeon. Brenda West 08/15/2011

## 2011-08-15 NOTE — ED Notes (Signed)
Pt. called back and told that Dr. Chales Salmon was the oral surgeon on call and the # is [phone],

## 2011-09-13 ENCOUNTER — Encounter (HOSPITAL_COMMUNITY): Payer: Self-pay | Admitting: *Deleted

## 2011-09-13 ENCOUNTER — Emergency Department (HOSPITAL_COMMUNITY)
Admission: EM | Admit: 2011-09-13 | Discharge: 2011-09-13 | Disposition: A | Discharge status: Home / Self Care | Payer: Self-pay | Attending: Emergency Medicine | Admitting: Emergency Medicine

## 2011-09-13 DIAGNOSIS — N390 Urinary tract infection, site not specified: Secondary | ICD-10-CM | POA: Insufficient documentation

## 2011-09-13 DIAGNOSIS — B9689 Other specified bacterial agents as the cause of diseases classified elsewhere: Secondary | ICD-10-CM | POA: Insufficient documentation

## 2011-09-13 DIAGNOSIS — N760 Acute vaginitis: Secondary | ICD-10-CM | POA: Insufficient documentation

## 2011-09-13 DIAGNOSIS — A499 Bacterial infection, unspecified: Secondary | ICD-10-CM | POA: Insufficient documentation

## 2011-09-13 LAB — WET PREP, GENITAL
Trich, Wet Prep: NONE SEEN
Yeast Wet Prep HPF POC: NONE SEEN

## 2011-09-13 LAB — URINE MICROSCOPIC-ADD ON

## 2011-09-13 LAB — URINALYSIS, ROUTINE W REFLEX MICROSCOPIC
Nitrite: NEGATIVE
Protein, ur: 30 mg/dL — AB
Specific Gravity, Urine: 1.033 — ABNORMAL HIGH (ref 1.005–1.030)
Urobilinogen, UA: 1 mg/dL (ref 0.0–1.0)

## 2011-09-13 MED ORDER — CEPHALEXIN 500 MG PO CAPS: 500.0000 mg | ORAL_CAPSULE | Freq: Four times a day (QID) | ORAL | Status: AC

## 2011-09-13 MED ORDER — LIDOCAINE HCL 1 % IJ SOLN
INTRAMUSCULAR | Status: AC
  Administered 2011-09-13: 2 mL
  Filled 2011-09-13: qty 20

## 2011-09-13 MED ORDER — AZITHROMYCIN 1 G PO PACK
1.0000 g | PACK | ORAL | Status: AC
  Administered 2011-09-13: 1 g via ORAL
  Filled 2011-09-13: qty 1

## 2011-09-13 MED ORDER — METRONIDAZOLE 0.75 % VA GEL: 1.0000 | Freq: Every day | VAGINAL | Status: AC

## 2011-09-13 MED ORDER — CEFTRIAXONE SODIUM 250 MG IJ SOLR
250.0000 mg | Freq: Once | INTRAMUSCULAR | Status: AC
  Administered 2011-09-13: 250 mg via INTRAMUSCULAR
  Filled 2011-09-13: qty 250

## 2011-09-13 NOTE — ED Notes (Signed)
Patient c/o irritation,  Vaginal discharge, and burning sensation when she urinates.

## 2011-09-13 NOTE — ED Notes (Signed)
Pelvic exam supplies at bedside  

## 2011-09-13 NOTE — Discharge Instructions (Signed)
RESOURCE GUIDE  Dental Problems  Patients with Medicaid: [facility]                     [facility] [address]                                           [address] Phone:  [phone]                                                  Phone:  [phone]  If unable to pay or uninsured, contact:  [facility] or [facility] to become qualified for the adult dental clinic.  Chronic Pain Problems Contact [facility]  [phone] Patients need to be referred by their primary care doctor.  Insufficient Money for Medicine Contact [employer]:  call "211" or [facility] 271-5999.  No Primary Care Doctor Call [facility]  832-8000 Other agencies that provide inexpensive medical care    [facility]  832-8035    [facility]  832-7272    [facility]  [phone]    Women's Clinic  832-4777    [facility]  [phone]    [facility]  [phone]  Psychological Services [facility]  [phone] [facility]  [phone] [facility]   800 853-5163 (emergency services 641-4993)  Substance Abuse Resources [facility]  336-882-2125 [facility] 336-784-9470 [facility] 336-285-9073 [facility] 336-845-3988 Residential & Outpatient Substance Abuse Program  800-659-3381  Abuse/Neglect [place] Child Abuse Hotline (336) 641-3795 [place] Child Abuse Hotline 800-378-5315 (After Hours)  Emergency Shelter [facility] (336) 271-5985  M[facility] (336) 275-9566 [facility] (704) 372-4663  MRSA Hotline #:   832-7006    [place] Resources  [facility] [place]     [employer]                          [facility] [address] [place]                       [address]      [address]  [place]                                                [place]                            [place] Phone:  [phone]                                   Phone:  [phone]                 Phone:  [phone]  [facility] Phone:  [phone]    [facility] Child Abuse Hotline 785-875-4231 308-409-3362 (After Hours)   Take the prescriptions as directed.  Your gonorrhea and chlamydia culture is pending results, and you will receive a phone call in the next several days if it is positive.  However, you were treated empirically today with antibiotics for both gonorrhea and chlamydia.  Call your regular OB/GYN doctor on Monday to schedule a follow up appointment this week.  Return to the Emergency Department immediately if worsening.

## 2011-09-13 NOTE — ED Provider Notes (Signed)
History     CSN: [id number]  Arrival date & time 09/13/11  1128   First MD Initiated Contact with Patient 09/13/11 1214      Chief Complaint  Patient presents with  . Vaginal Discharge     HPI Pt was seen at 1220.  Per pt, c/o gradual onset and persistence of constant dysuria, vaginal discharge and labial "burning" sensation when she urinates for the past several days.  Denies N/V/D, no vaginal bleeding, no abd pain, no flank pain, no fevers.   History reviewed. No pertinent past medical history.  History reviewed. No pertinent past surgical history.   History  Substance Use Topics  . Smoking status: Never Smoker   . Smokeless tobacco: Not on file  . Alcohol Use: Yes    Review of Systems ROS: Statement: All systems negative except as marked or noted in the HPI; Constitutional: Negative for fever and chills. ; ; Eyes: Negative for eye pain, redness and discharge. ; ; ENMT: Negative for ear pain, hoarseness, nasal congestion, sinus pressure and sore throat. ; ; Cardiovascular: Negative for chest pain, palpitations, diaphoresis, dyspnea and peripheral edema. ; ; Respiratory: Negative for cough, wheezing and stridor. ; ; Gastrointestinal: Negative for nausea, vomiting, diarrhea, abdominal pain, blood in stool, hematemesis, jaundice and rectal bleeding. . ; ; Genitourinary: +dysuria. Negative for flank pain and hematuria.; GYN:  No vaginal bleeding, +vaginal discharge, no vulvar rash. ; Musculoskeletal: Negative for back pain and neck pain. Negative for swelling and trauma.; ;Skin: Negative for pruritus, rash, abrasions, blisters, bruising and skin lesion.; ; Neuro: Negative for headache, lightheadedness and neck stiffness. Negative for weakness, altered level of consciousness , altered mental status, extremity weakness, paresthesias, involuntary movement, seizure and syncope.     Allergies  Review of patient's allergies indicates no known allergies.  Home Medications   Current  Outpatient Rx  Name Route Sig Dispense Refill  . CHLORPHEN-PSEUDOEPHED-APAP 2-30-500 MG PO TABS Oral Take 1 tablet by mouth every 4 (four) hours as needed. For allergies.    Marland Kitchen CIPROFLOXACIN HCL 500 MG PO TABS Oral Take 500 mg by mouth 2 (two) times daily. 3 day course of therapy; started 09/12/11    . HYDROCODONE-ACETAMINOPHEN 5-325 MG PO TABS Oral Take 1 tablet by mouth every 6 (six) hours as needed. For pain.    Marland Kitchen MICONAZOLE NITRATE 1200-2 MG-% VA KIT Vaginal Place 1 each vaginally once.    Marland Kitchen PENICILLIN V POTASSIUM 500 MG PO TABS Oral Take 500 mg by mouth 4 (four) times daily.      BP 100/66  Pulse 86  Temp(Src) 98.4 F (36.9 C) (Oral)  Resp 16  SpO2 100%  Physical Exam 1225: Physical examination:  Nursing notes reviewed; Vital signs and O2 SAT reviewed;  Constitutional: Well developed, Well nourished, Well hydrated, In no acute distress; Head:  Normocephalic, atraumatic; Eyes: EOMI, PERRL, No scleral icterus; ENMT: Mouth and pharynx normal, Mucous membranes moist; Neck: Supple, Full range of motion, No lymphadenopathy; Cardiovascular: Regular rate and rhythm, No murmur, rub, or gallop; Respiratory: Breath sounds clear & equal bilaterally, No rales, rhonchi, wheezes, or rub, Normal respiratory effort/excursion; Chest: Nontender, Movement normal; Abdomen: Soft, Nontender, Nondistended, Normal bowel sounds; Genitourinary: No CVA tenderness; Pelvic exam performed with permission of pt and female ED tech assist during exam.  External genitalia w/o lesions. Vaginal vault with copious white discharge.  Cervix w/o lesions, not friable, GC/chlam and wet prep obtained and sent to lab.  Pt refuses bimanual exam due to external labia "  burning" pain.  No labial lesions; Extremities: Pulses normal, No tenderness, No edema, No calf edema or asymmetry.; Neuro: AA&Ox3, Major CN grossly intact.  No gross focal motor or sensory deficits in extremities.; Skin: Color normal, Warm, Dry, no rash.    ED Course    Procedures   MDM  MDM Reviewed: nursing note and vitals Interpretation: labs   Results for orders placed during the hospital encounter of 09/13/11  URINALYSIS, ROUTINE W REFLEX MICROSCOPIC      Component Value Range   Color, Urine AMBER (*) YELLOW    APPearance CLOUDY (*) CLEAR    Specific Gravity, Urine 1.033 (*) 1.005 - 1.030    pH 5.5  5.0 - 8.0    Glucose, UA NEGATIVE  NEGATIVE (mg/dL)   Hgb urine dipstick LARGE (*) NEGATIVE    Bilirubin Urine SMALL (*) NEGATIVE    Ketones, ur TRACE (*) NEGATIVE (mg/dL)   Protein, ur 30 (*) NEGATIVE (mg/dL)   Urobilinogen, UA 1.0  0.0 - 1.0 (mg/dL)   Nitrite NEGATIVE  NEGATIVE    Leukocytes, UA SMALL (*) NEGATIVE   WET PREP, GENITAL      Component Value Range   Yeast Wet Prep HPF POC NONE SEEN  NONE SEEN    Trich, Wet Prep NONE SEEN  NONE SEEN    Clue Cells Wet Prep HPF POC MANY (*) NONE SEEN    WBC, Wet Prep HPF POC MANY (*) NONE SEEN   POCT PREGNANCY, URINE      Component Value Range   Preg Test, Ur NEGATIVE  NEGATIVE   URINE MICROSCOPIC-ADD ON      Component Value Range   Squamous Epithelial / LPF MANY (*) RARE    WBC, UA 11-20  <3 (WBC/hpf)   RBC / HPF 3-6  <3 (RBC/hpf)   Bacteria, UA MANY (*) RARE    Urine-Other MUCOUS PRESENT       2:33 PM:  Pt wants to go home now.  +UTI, +BV, GC/chlam pending.  Dx testing d/w pt.  Questions answered.  Verb understanding, agreeable to d/c home with outpt f/u.            Laray Anger, DO 09/15/11 1200

## 2011-09-13 NOTE — ED Notes (Signed)
Patient given discharge instructions, information, prescriptions, and diet order. Patient states that they adequately understand discharge information given and to return to ED if symptoms return or worsen.     

## 2011-09-13 NOTE — ED Notes (Signed)
Pt. aware need urine sample; given sample cup.

## 2011-09-13 NOTE — ED Notes (Signed)
Mcmannus MD made aware that pelvic cart is set up and ready by B. Para March NT.

## 2011-11-12 ENCOUNTER — Emergency Department (HOSPITAL_COMMUNITY)
Admission: EM | Admit: 2011-11-12 | Discharge: 2011-11-12 | Disposition: A | Discharge status: Home / Self Care | Payer: Self-pay | Attending: Emergency Medicine | Admitting: Emergency Medicine

## 2011-11-12 ENCOUNTER — Encounter (HOSPITAL_COMMUNITY): Payer: Self-pay | Admitting: Emergency Medicine

## 2011-11-12 DIAGNOSIS — B373 Candidiasis of vulva and vagina: Secondary | ICD-10-CM

## 2011-11-12 DIAGNOSIS — K0889 Other specified disorders of teeth and supporting structures: Secondary | ICD-10-CM

## 2011-11-12 DIAGNOSIS — K089 Disorder of teeth and supporting structures, unspecified: Secondary | ICD-10-CM | POA: Insufficient documentation

## 2011-11-12 DIAGNOSIS — B3731 Acute candidiasis of vulva and vagina: Secondary | ICD-10-CM | POA: Insufficient documentation

## 2011-11-12 DIAGNOSIS — N390 Urinary tract infection, site not specified: Secondary | ICD-10-CM | POA: Insufficient documentation

## 2011-11-12 DIAGNOSIS — R51 Headache: Secondary | ICD-10-CM | POA: Insufficient documentation

## 2011-11-12 LAB — URINALYSIS, ROUTINE W REFLEX MICROSCOPIC
Glucose, UA: NEGATIVE mg/dL
pH: 6.5 (ref 5.0–8.0)

## 2011-11-12 LAB — WET PREP, GENITAL

## 2011-11-12 LAB — URINE MICROSCOPIC-ADD ON

## 2011-11-12 MED ORDER — TRIAMCINOLONE ACETONIDE 0.025 % EX OINT: TOPICAL_OINTMENT | Freq: Two times a day (BID) | CUTANEOUS | Status: DC

## 2011-11-12 MED ORDER — HYDROCODONE-ACETAMINOPHEN 5-325 MG PO TABS
1.0000 | ORAL_TABLET | Freq: Once | ORAL | Status: AC
  Administered 2011-11-12: 1 via ORAL
  Filled 2011-11-12: qty 1

## 2011-11-12 MED ORDER — FLUCONAZOLE 200 MG PO TABS: 200.0000 mg | ORAL_TABLET | Freq: Every day | ORAL | Status: AC

## 2011-11-12 MED ORDER — CIPROFLOXACIN HCL 500 MG PO TABS: 500.0000 mg | ORAL_TABLET | Freq: Two times a day (BID) | ORAL | Status: AC

## 2011-11-12 MED ORDER — HYDROCODONE-ACETAMINOPHEN 5-500 MG PO TABS: 1.0000 | ORAL_TABLET | Freq: Four times a day (QID) | ORAL | Status: AC | PRN

## 2011-11-12 NOTE — ED Provider Notes (Signed)
History     CSN: [id number]  Arrival date & time 11/12/11  1625   First MD Initiated Contact with Patient 11/12/11 1815      Chief Complaint  Patient presents with  . Vaginal Discharge  . Headache    (Consider location/radiation/quality/duration/timing/severity/associated sxs/prior treatment) HPI  Patient presents to the ED with complaints of vaginal discharge with some suprapubic pain. She is also complaints of dental pain that is ongoing due to impacted molars.  Vaginal discharge: The patients discharge started a couple of weeks ago and then subsided with her period last week then return again as soon as her period ended. She admits to getting frequent yeast infections and says that she does not know why. She denies sever low back pain and abdominal pain but more of a discomfort. Her symptoms are moderate.  Dental pain: The patient states that she has been diagnosed with impacted wisdom teeth but has been unable to afford to have them taken out. She states that she has been on two rounds of antibiotics but it has not helped the pain. The pain doe snot radiate anywhere and is mild to moderate. No other associated symptoms.  Past Medical History  Diagnosis Date  . Anxiety   . BV (bacterial vaginosis)   . Vaginal yeast infection     Past Surgical History  Procedure Date  . Hip surgery     History reviewed. No pertinent family history.  History  Substance Use Topics  . Smoking status: Never Smoker   . Smokeless tobacco: Not on file  . Alcohol Use: Yes     Occasional    OB History    Grav Para Term Preterm Abortions TAB SAB Ect Mult Living                  Review of Systems   HEENT: denies blurry vision or change in hearing PULMONARY: Denies difficulty breathing and SOB CARDIAC: denies chest pain or heart palpitations MUSCULOSKELETAL:  denies being unable to ambulate ABDOMEN AL: denies abdominal pain GU: denies loss of bowel or urinary control NEURO: denies  numbness and tingling in extremities   Allergies  Review of patient's allergies indicates no known allergies.  Home Medications   Current Outpatient Rx  Name Route Sig Dispense Refill  . CEPHALEXIN 500 MG PO CAPS Oral Take 500 mg by mouth 4 (four) times daily.    . IBUPROFEN 200 MG PO TABS Oral Take 200 mg by mouth every 6 (six) hours as needed. Pain    . OXYCODONE-ACETAMINOPHEN 10-325 MG PO TABS Oral Take 1 tablet by mouth every 4 (four) hours as needed. Pain    . PENICILLIN V POTASSIUM 500 MG PO TABS Oral Take 500 mg by mouth 4 (four) times daily. Take after 10 days. Stop date 11/02/11    . CIPROFLOXACIN HCL 500 MG PO TABS Oral Take 1 tablet (500 mg total) by mouth 2 (two) times daily. 14 tablet 0  . FLUCONAZOLE 200 MG PO TABS Oral Take 1 tablet (200 mg total) by mouth daily. 7 tablet 0  . HYDROCODONE-ACETAMINOPHEN 5-500 MG PO TABS Oral Take 1-2 tablets by mouth every 6 (six) hours as needed for pain. 10 tablet 0    BP 112/70  Pulse 92  Temp 98.7 F (37.1 C)  Resp 16  SpO2 100%  LMP 10/29/2011  Physical Exam  Nursing note and vitals reviewed. Constitutional: She appears well-developed and well-nourished. No distress.  HENT:  Head: Normocephalic and atraumatic.  Eyes:  Pupils are equal, round, and reactive to light.  Neck: Normal range of motion. Neck supple.  Cardiovascular: Normal rate and regular rhythm.   Pulmonary/Chest: Effort normal. She has no wheezes. She has no rales.  Abdominal: Soft. She exhibits no distension and no mass. There is tenderness (suprapubic pain). There is no rebound and no guarding.  Genitourinary: Uterus normal. There is rash and tenderness on the right labia. There is rash and tenderness on the left labia. Cervix exhibits no motion tenderness, no discharge and no friability. Right adnexum displays no mass, no tenderness and no fullness. Left adnexum displays no mass, no tenderness and no fullness. No erythema, tenderness or bleeding around the vagina.  No foreign body around the vagina. No signs of injury around the vagina. Vaginal discharge (white curd like discharge) found.  Neurological: She is alert.  Skin: Skin is warm and dry.    ED Course  Procedures (including critical care time)  Labs Reviewed  URINALYSIS, ROUTINE W REFLEX MICROSCOPIC - Abnormal; Notable for the following:    APPearance CLOUDY (*)    Hgb urine dipstick MODERATE (*)    Ketones, ur TRACE (*)    Nitrite POSITIVE (*)    Leukocytes, UA MODERATE (*)    All other components within normal limits  URINE MICROSCOPIC-ADD ON - Abnormal; Notable for the following:    Bacteria, UA MANY (*)    All other components within normal limits  WET PREP, GENITAL - Abnormal; Notable for the following:    Yeast Wet Prep HPF POC MANY (*)    Clue Cells Wet Prep HPF POC RARE (*)    WBC, Wet Prep HPF POC RARE (*)    All other components within normal limits  GC/CHLAMYDIA PROBE AMP, GENITAL   No results found.   1. Vaginal yeast infection   2. UTI (lower urinary tract infection)   3. Pain, dental       MDM  The patients labs show her to have a UTI and yeast infection. Pt has been given referral to maxillofacial surgeon. She has also been given a resource guide for some other lower cost options. The patient has also been given a referral to Gynecology as she expresses that she gets repeat yeast infections. She denies being a diabetic and is negative for glucose in her urine.  Patient has been given Rx for Diflucan, cipro and a small dose of Vicodin.   Pt has been advised of the symptoms that warrant their return to the ED. Patient has voiced understanding and has agreed to follow-up with the PCP or specialist.         Dorthula Matas, PA 11/12/11 2026

## 2011-11-12 NOTE — ED Notes (Signed)
White vaginal discharge x 1 week with minor odor, irritation and itchiness present. Last intercourse 2 weeks ago, 1 partner only , dx with bacterial vaginosis recently, pain at lower abdomen, frequent urination, no dysuria.

## 2011-11-12 NOTE — Discharge Instructions (Signed)
Candida Infection, Adult A candida infection (also called yeast, fungus and Monilia infection) is an overgrowth of yeast that can occur anywhere on the body. A yeast infection commonly occurs in warm, moist body areas. Usually, the infection remains localized but can spread to become a systemic infection. A yeast infection may be a sign of a more severe disease such as diabetes, leukemia, or AIDS. A yeast infection can occur in both men and women. In women, Candida vaginitis is a vaginal infection. It is one of the most common causes of vaginitis. Men usually do not have symptoms or know they have an infection until other problems develop. Men may find out they have a yeast infection because their sex partner has a yeast infection. Uncircumcised men are more likely to get a yeast infection than circumcised men. This is because the uncircumcised glans is not exposed to air and does not remain as dry as that of a circumcised glans. Older adults may develop yeast infections around dentures. CAUSES  Women  Antibiotics.   Steroid medication taken for a long time.   Being overweight (obese).   Diabetes.   Poor immune condition.   Certain serious medical conditions.   Immune suppressive medications for organ transplant patients.   Chemotherapy.   Pregnancy.   Menstration.   Stress and fatigue.   Intravenous drug use.   Oral contraceptives.   Wearing tight-fitting clothes in the crotch area.   Catching it from a sex partner who has a yeast infection.   Spermicide.   Intravenous, urinary, or other catheters.  Men  Catching it from a sex partner who has a yeast infection.   Having oral or anal sex with a person who has the infection.   Spermicide.   Diabetes.   Antibiotics.   Poor immune system.   Medications that suppress the immune system.   Intravenous drug use.   Intravenous, urinary, or other catheters.  SYMPTOMS  Women  Thick, white vaginal discharge.    Vaginal itching.   Redness and swelling in and around the vagina.   Irritation of the lips of the vagina and perineum.   Blisters on the vaginal lips and perineum.   Painful sexual intercourse.   Low blood sugar (hypoglycemia).   Painful urination.   Bladder infections.   Intestinal problems such as constipation, indigestion, bad breath, bloating, increase in gas, diarrhea, or loose stools.  Men  Men may develop intestinal problems such as constipation, indigestion, bad breath, bloating, increase in gas, diarrhea, or loose stools.   Dry, cracked skin on the penis with itching or discomfort.   Jock itch.   Dry, flaky skin.   Athlete's foot.   Hypoglycemia.  DIAGNOSIS  Women  A history and an exam are performed.   The discharge may be examined under a microscope.   A culture may be taken of the discharge.  Men  A history and an exam are performed.   Any discharge from the penis or areas of cracked skin will be looked at under the microscope and cultured.   Stool samples may be cultured.  TREATMENT  Women  Vaginal antifungal suppositories and creams.   Medicated creams to decrease irritation and itching on the outside of the vagina.   Warm compresses to the perineal area to decrease swelling and discomfort.   Oral antifungal medications.   Medicated vaginal suppositories or cream for repeated or recurrent infections.   Wash and dry the irritation areas before applying the cream.     Eating yogurt with lactobacillus may help with prevention and treatment.   Sometimes painting the vagina with gentian violet solution may help if creams and suppositories do not work.  Men  Antifungal creams and oral antifungal medications.   Sometimes treatment must continue for 30 days after the symptoms go away to prevent recurrence.  HOME CARE INSTRUCTIONS  Women  Use cotton underwear and avoid tight-fitting clothing.   Avoid colored, scented toilet paper and  deodorant tampons or pads.   Do not douche.   Keep your diabetes under control.   Finish all the prescribed medications.   Keep your skin clean and dry.   Consume milk or yogurt with lactobacillus active culture regularly. If you get frequent yeast infections and think that is what the infection is, there are over-the-counter medications that you can get. If the infection does not show healing in 3 days, talk to your caregiver.   Tell your sex partner you have a yeast infection. Your partner may need treatment also, especially if your infection does not clear up or recurs.  Men  Keep your skin clean and dry.   Keep your diabetes under control.   Finish all prescribed medications.   Tell your sex partner that you have a yeast infection so they can be treated if necessary.  SEEK MEDICAL CARE IF:   Your symptoms do not clear up or worsen in one week after treatment.   You have an oral temperature above 102 F (38.9 C).   You have trouble swallowing or eating for a prolonged time.   You develop blisters on and around your vagina.   You develop vaginal bleeding and it is not your menstrual period.   You develop abdominal pain.   You develop intestinal problems as mentioned above.   You get weak or lightheaded.   You have painful or increased urination.   You have pain during sexual intercourse.  MAKE SURE YOU:   Understand these instructions.   Will watch your condition.   Will get help right away if you are not doing well or get worse.  Document Released: 07/31/2004 Document Revised: 06/12/2011 Document Reviewed: 11/12/2009 [facility] Patient Information 2012 [place], [place].Dental Pain A tooth ache may be caused by cavities (tooth decay). Cavities expose the nerve of the tooth to air and hot or cold temperatures. It may come from an infection or abscess (also called a boil or furuncle) around your tooth. It is also often caused by dental caries (tooth decay). This  causes the pain you are having. DIAGNOSIS  Your caregiver can diagnose this problem by exam. TREATMENT   If caused by an infection, it may be treated with medications which kill germs (antibiotics) and pain medications as prescribed by your caregiver. Take medications as directed.   Only take over-the-counter or prescription medicines for pain, discomfort, or fever as directed by your caregiver.   Whether the tooth ache today is caused by infection or dental disease, you should see your dentist as soon as possible for further care.  SEEK MEDICAL CARE IF: The exam and treatment you received today has been provided on an emergency basis only. This is not a substitute for complete medical or dental care. If your problem worsens or new problems (symptoms) appear, and you are unable to meet with your dentist, call or return to this location. SEEK IMMEDIATE MEDICAL CARE IF:   You have a fever.   You develop redness and swelling of your face, jaw, or neck.  You are unable to open your mouth.   You have severe pain uncontrolled by pain medicine.  MAKE SURE YOU:   Understand these instructions.   Will watch your condition.   Will get help right away if you are not doing well or get worse.  Document Released: 06/23/2005 Document Revised: 06/12/2011 Document Reviewed: 02/09/2008 [facility] Patient Information 2012 [address], [place].Urinary Tract Infection Infections of the urinary tract can start in several places. A bladder infection (cystitis), a kidney infection (pyelonephritis), and a prostate infection (prostatitis) are different types of urinary tract infections (UTIs). They usually get better if treated with medicines (antibiotics) that kill germs. Take all the medicine until it is gone. You or your child may feel better in a few days, but TAKE ALL MEDICINE or the infection may not respond and may become more difficult to treat. HOME CARE INSTRUCTIONS   Drink enough water and fluids to keep  the urine clear or pale yellow. Cranberry juice is especially recommended, in addition to large amounts of water.   Avoid caffeine, tea, and carbonated beverages. They tend to irritate the bladder.   Alcohol may irritate the prostate.   Only take over-the-counter or prescription medicines for pain, discomfort, or fever as directed by your caregiver.  To prevent further infections:  Empty the bladder often. Avoid holding urine for long periods of time.   After a bowel movement, women should cleanse from front to back. Use each tissue only once.   Empty the bladder before and after sexual intercourse.  FINDING OUT THE RESULTS OF YOUR TEST Not all test results are available during your visit. If your or your child's test results are not back during the visit, make an appointment with your caregiver to find out the results. Do not assume everything is normal if you have not heard from your caregiver or the medical facility. It is important for you to follow up on all test results. SEEK MEDICAL CARE IF:   There is back pain.   Your baby is older than 3 months with a rectal temperature of 100.5 F (38.1 C) or higher for more than 1 day.   Your or your child's problems (symptoms) are no better in 3 days. Return sooner if you or your child is getting worse.  SEEK IMMEDIATE MEDICAL CARE IF:   There is severe back pain or lower abdominal pain.   You or your child develops chills.   You have a fever.   Your baby is older than 3 months with a rectal temperature of 102 F (38.9 C) or higher.   Your baby is 61 months old or younger with a rectal temperature of 100.4 F (38 C) or higher.   There is nausea or vomiting.   There is continued burning or discomfort with urination.  MAKE SURE YOU:   Understand these instructions.   Will watch your condition.   Will get help right away if you are not doing well or get worse.  Document Released: 04/02/2005 Document Revised: 06/12/2011  Document Reviewed: 11/05/2006 [facility] Patient Information 2012 [place], [place].  RESOURCE GUIDE  Dental Problems  Patients with Medicaid: [facility] [id number] [address]  [address] Phone:  [phone]                                                  Phone:  [phone]  If unable to pay or uninsured, contact:  [facility] or [facility]. to become qualified for the adult dental clinic.  Chronic Pain Problems Contact [facility]  [phone] Patients need to be referred by their primary care doctor.  Insufficient Money for Medicine Contact [employer]:  call "211" or [facility] [phone].  No Primary Care Doctor Call [facility]  [phone] Other agencies that provide inexpensive medical care    [facility]  [phone]    [facility]  [phone]    [facility]  [phone]    [facility]  [phone]    [facility]  [phone]    [facility]  [phone]  Psychological Services [facility]  [phone] [facility] Services  [phone] [facility]   [phone] (emergency services ([phone]) [phone])  Substance Abuse Resources [facility] Drug Services  (262)422-0977 [facility] 340-194-3711 [facility] [phone] [address] Flock [phone] Residential & Outpatient Substance Abuse Program  [phone]  Abuse/Neglect [facility] Child Abuse Hotline 478-508-6632 [facility] Child Abuse Hotline 681-289-7664 (After Hours)  Emergency Shelter [facility] 3106197374  Maternity Homes [facility] at the [place] [facility] [phone] [facility] (514)[phone]  MRSA Hotline #:   970-292-6415    [facility] Resources  [facility]     [employer]                          [facility] Dept. [address] [place]                       [address]      [address]  Blondell Reveal Phone:  [phone]                                   Phone:  [phone]                 Phone:  [phone]  [facility] Phone:  [phone]  [facility] Child Abuse Hotline (814)[phone] (After Hours)

## 2011-11-12 NOTE — ED Notes (Signed)
C/o white vag d/c with lower abd pain

## 2011-11-12 NOTE — ED Provider Notes (Signed)
Medical screening examination/treatment/procedure(s) were performed by non-physician practitioner and as supervising physician I was immediately available for consultation/collaboration.  Cheri Guppy, MD 11/12/11 2351

## 2012-02-12 ENCOUNTER — Encounter (HOSPITAL_COMMUNITY): Payer: Self-pay | Admitting: Vascular Surgery

## 2012-02-12 ENCOUNTER — Emergency Department (HOSPITAL_COMMUNITY)
Admission: EM | Admit: 2012-02-12 | Discharge: 2012-02-12 | Disposition: A | Discharge status: Home / Self Care | Payer: Self-pay | Attending: Emergency Medicine | Admitting: Emergency Medicine

## 2012-02-12 DIAGNOSIS — B373 Candidiasis of vulva and vagina: Secondary | ICD-10-CM

## 2012-02-12 DIAGNOSIS — N898 Other specified noninflammatory disorders of vagina: Secondary | ICD-10-CM | POA: Insufficient documentation

## 2012-02-12 LAB — URINE MICROSCOPIC-ADD ON

## 2012-02-12 LAB — URINALYSIS, ROUTINE W REFLEX MICROSCOPIC
Glucose, UA: 100 mg/dL — AB
Protein, ur: NEGATIVE mg/dL
pH: 7 (ref 5.0–8.0)

## 2012-02-12 LAB — WET PREP, GENITAL
Clue Cells Wet Prep HPF POC: NONE SEEN
Trich, Wet Prep: NONE SEEN

## 2012-02-12 LAB — PREGNANCY, URINE: Preg Test, Ur: NEGATIVE

## 2012-02-12 MED ORDER — CLOTRIMAZOLE 1 % VA CREA: 1.0000 | TOPICAL_CREAM | Freq: Every day | VAGINAL | Status: AC

## 2012-02-12 MED ORDER — CEFTRIAXONE SODIUM 250 MG IJ SOLR: 250.0000 mg | Freq: Once | INTRAMUSCULAR | Status: DC

## 2012-02-12 MED ORDER — FLUCONAZOLE 200 MG PO TABS: 200.0000 mg | ORAL_TABLET | ORAL | Status: AC

## 2012-02-12 MED ORDER — FLUCONAZOLE 150 MG PO TABS
150.0000 mg | ORAL_TABLET | Freq: Every day | ORAL | Status: DC
  Administered 2012-02-12: 150 mg via ORAL
  Filled 2012-02-12: qty 1

## 2012-02-12 MED ORDER — AZITHROMYCIN 1 G PO PACK
1.0000 g | PACK | Freq: Once | ORAL | Status: DC
  Filled 2012-02-12: qty 1

## 2012-02-12 NOTE — ED Provider Notes (Addendum)
History     CSN: [id number]  Arrival date & time 02/12/12  1241   First MD Initiated Contact with Patient 02/12/12 1525      Chief Complaint  Patient presents with  . Vaginitis    (Consider location/radiation/quality/duration/timing/severity/associated sxs/prior treatment) HPI Comments: G29P0 female with no hx of STD, but hx of yeast infections comes in with 1 day hx of vaginal discharge that is white, non foul smelling and simlar to her previous yeast infection. She has some itching, no abd pain, no UTI like sx, and no n/v/f/c. Pt denies any unprotected intercourse. No recent AB use.  The history is provided by the patient.    Past Medical History  Diagnosis Date  . Anxiety   . BV (bacterial vaginosis)   . Vaginal yeast infection     Past Surgical History  Procedure Date  . Hip surgery     Family History  Problem Relation Age of Onset  . Diabetes Father   . Cancer Father   . Hypertension Sister     History  Substance Use Topics  . Smoking status: Never Smoker   . Smokeless tobacco: Not on file  . Alcohol Use: Yes     Occasional    OB History    Grav Para Term Preterm Abortions TAB SAB Ect Mult Living                  Review of Systems  Constitutional: Negative for activity change.  HENT: Negative for neck pain.   Respiratory: Negative for shortness of breath.   Cardiovascular: Negative for chest pain.  Gastrointestinal: Negative for nausea, vomiting and abdominal pain.  Genitourinary: Positive for vaginal discharge. Negative for dysuria, frequency and flank pain.  Neurological: Negative for headaches.    Allergies  Review of patient's allergies indicates no known allergies.  Home Medications   Current Outpatient Rx  Name Route Sig Dispense Refill  . FLUCONAZOLE 200 MG PO TABS Oral Take 200 mg by mouth daily as needed. For yeast infections.    Marland Kitchen PROBIOTIC DAILY PO Oral Take 1 capsule by mouth daily as needed. For yeast infections.      BP 125/91   Pulse 90  Temp 98.6 F (37 C) (Oral)  Resp 20  SpO2 100%  LMP 01/28/2012  Physical Exam  Nursing note and vitals reviewed. Constitutional: She is oriented to person, place, and time. She appears well-developed and well-nourished.  HENT:  Head: Normocephalic and atraumatic.  Eyes: Conjunctivae and EOM are normal. Pupils are equal, round, and reactive to light.  Neck: Normal range of motion. Neck supple.  Cardiovascular: Normal rate, regular rhythm, normal heart sounds and intact distal pulses.   No murmur heard. Pulmonary/Chest: Effort normal. No respiratory distress. She has no wheezes.  Abdominal: Soft. Bowel sounds are normal. She exhibits no distension. There is no tenderness. There is no rebound and no guarding.  Genitourinary: Vagina normal and uterus normal.       External exam - normal, no lesions Speculum exam: Pt has some white, curdish, thick discharge, no blood Bimanual exam: Patient has no CMT, no adnexal tenderness or fullness and cervical os is closed  Neurological: She is alert and oriented to person, place, and time.  Skin: Skin is warm and dry.    ED Course  Procedures (including critical care time)  Labs Reviewed  URINALYSIS, ROUTINE W REFLEX MICROSCOPIC - Abnormal; Notable for the following:    Glucose, UA 100 (*)     Hgb  urine dipstick SMALL (*)     Leukocytes, UA SMALL (*)     All other components within normal limits  PREGNANCY, URINE  URINE MICROSCOPIC-ADD ON  GC/CHLAMYDIA PROBE AMP, GENITAL  WET PREP, GENITAL   No results found.   No diagnosis found.    MDM  DDX includes: Bacterial Vaginosis Yeast infection STD - GC, Chlamydia, Trichomonas UTI   A/P: Pt with hx of of vaginal discharge due to yeast, and comes in with same complain at this time. No risk factors for STD, and no hx of STD. Will get GC and Chlamydia swabs, we will get other labs. Will not treat for STD per patient's request for GC and Chlamydia, and i agree, as she  confirms low risk for STD, and no engagement in risky behavior that would lead to STD.      Derwood Kaplan, MD 02/12/12 1545  Wet mount shows no yeast - however, given the white curdish discharge and itching present w/ previous hx of the same, we will give diflucan.   Derwood Kaplan, MD 02/12/12 1708

## 2012-02-12 NOTE — ED Notes (Signed)
Pt verbalizes understanding 

## 2012-02-12 NOTE — ED Notes (Signed)
Pt reports having a yeast infection for past three days. States that she had one this past May and knows it's the same thing. States she has thick, white vaginal discharge, and that her vagina itches.Pt denies abdominal pain, nausea, or vomiting.

## 2012-02-12 NOTE — ED Notes (Signed)
Pt reports she itching, pain, and white curd like discharge x 3 days. Itching increases burning sensation. Denies urinary frequency, pain, and urgency. Also denies abd pain.

## 2012-05-17 ENCOUNTER — Encounter (HOSPITAL_COMMUNITY): Payer: Self-pay | Admitting: *Deleted

## 2012-05-17 ENCOUNTER — Emergency Department (HOSPITAL_COMMUNITY): Payer: Self-pay

## 2012-05-17 ENCOUNTER — Emergency Department (HOSPITAL_COMMUNITY)
Admission: EM | Admit: 2012-05-17 | Discharge: 2012-05-18 | Disposition: A | Discharge status: Home / Self Care | Payer: Self-pay | Attending: Emergency Medicine | Admitting: Emergency Medicine

## 2012-05-17 DIAGNOSIS — F411 Generalized anxiety disorder: Secondary | ICD-10-CM | POA: Insufficient documentation

## 2012-05-17 DIAGNOSIS — Z79899 Other long term (current) drug therapy: Secondary | ICD-10-CM | POA: Insufficient documentation

## 2012-05-17 DIAGNOSIS — N898 Other specified noninflammatory disorders of vagina: Secondary | ICD-10-CM | POA: Insufficient documentation

## 2012-05-17 DIAGNOSIS — J40 Bronchitis, not specified as acute or chronic: Secondary | ICD-10-CM | POA: Insufficient documentation

## 2012-05-17 DIAGNOSIS — N760 Acute vaginitis: Secondary | ICD-10-CM | POA: Insufficient documentation

## 2012-05-17 DIAGNOSIS — L739 Follicular disorder, unspecified: Secondary | ICD-10-CM

## 2012-05-17 DIAGNOSIS — L738 Other specified follicular disorders: Secondary | ICD-10-CM | POA: Insufficient documentation

## 2012-05-17 LAB — WET PREP, GENITAL

## 2012-05-17 MED ORDER — METRONIDAZOLE 0.75 % VA GEL: 1.0000 | Freq: Two times a day (BID) | VAGINAL | Status: DC

## 2012-05-17 MED ORDER — CEPHALEXIN 500 MG PO CAPS: 500.0000 mg | ORAL_CAPSULE | Freq: Four times a day (QID) | ORAL | Status: DC

## 2012-05-17 MED ORDER — BENZONATATE 100 MG PO CAPS: 100.0000 mg | ORAL_CAPSULE | Freq: Three times a day (TID) | ORAL | Status: DC

## 2012-05-17 NOTE — ED Notes (Signed)
Pt c/o cough/cold symptoms; vaginal discharge with odor x 10 days

## 2012-05-17 NOTE — ED Provider Notes (Addendum)
History     CSN: [id number]  Arrival date & time 05/17/12  1925   First MD Initiated Contact with Patient 05/17/12 2014      Chief Complaint  Patient presents with  . Vaginal Discharge  . Cough    (Consider location/radiation/quality/duration/timing/severity/associated sxs/prior treatment) Patient is a 26 y.o. female presenting with vaginal discharge and cough. The history is provided by the patient.  Vaginal Discharge Pertinent negatives include no chest pain, no abdominal pain, no headaches and no shortness of breath.  Cough Pertinent negatives include no chest pain, no chills, no headaches and no shortness of breath.   26 year old, female, resents to emergency department complaining of a nonproductive cough, as well as a vaginal discharge, with a foul and.  She denies nausea, vomiting, fevers, chills, shortness of breath.  She denies chest pain.  She denies abdominal pain, or dysuria.  She denies vaginal rash.  She has one sexual partner.  She desires smoking or asthma.  Past Medical History  Diagnosis Date  . Anxiety   . BV (bacterial vaginosis)   . Vaginal yeast infection     Past Surgical History  Procedure Date  . Hip surgery     Family History  Problem Relation Age of Onset  . Diabetes Father   . Cancer Father   . Hypertension Sister     History  Substance Use Topics  . Smoking status: Never Smoker   . Smokeless tobacco: Not on file  . Alcohol Use: Yes     Comment: Occasional    OB History    Grav Para Term Preterm Abortions TAB SAB Ect Mult Living                  Review of Systems  Constitutional: Negative for fever, chills and diaphoresis.  HENT: Negative for congestion.   Respiratory: Positive for cough. Negative for chest tightness and shortness of breath.   Cardiovascular: Negative for chest pain and leg swelling.  Gastrointestinal: Negative for nausea, vomiting and abdominal pain.  Genitourinary: Positive for vaginal discharge. Negative  for dysuria, hematuria and vaginal bleeding.  Skin: Negative for rash.  Neurological: Negative for headaches.  All other systems reviewed and are negative.    Allergies  Review of patient's allergies indicates no known allergies.  Home Medications   Current Outpatient Rx  Name  Route  Sig  Dispense  Refill  . CLOTRIMAZOLE 1 % VA CREA   Vaginal   Place 1 Applicatorful vaginally daily.         Marland Kitchen VITAMIN A & D 10000-400 UNITS PO CAPS   Oral   Take 1 capsule by mouth daily.           BP 130/91  Pulse 100  Temp 97.8 F (36.6 C)  Resp 20  SpO2 100%  LMP 04/30/2012  Physical Exam  Nursing note and vitals reviewed. Constitutional: She is oriented to person, place, and time. She appears well-developed and well-nourished. No distress.  HENT:  Head: Normocephalic and atraumatic.  Eyes: Conjunctivae normal are normal.  Neck: Normal range of motion. Neck supple.  Cardiovascular: Normal rate and regular rhythm.   Pulmonary/Chest: Effort normal and breath sounds normal. No respiratory distress.  Abdominal: Soft. Bowel sounds are normal. She exhibits no distension. There is no tenderness.  Genitourinary:       Pelvic exam performed with nurse tech chaperone Folliculitis on mons pubis.  No ulcerations. No cervical motion tenderness.  Positive small amount of thin, white discharge.  No adnexal masses  Musculoskeletal: Normal range of motion. She exhibits no edema.  Neurological: She is alert and oriented to person, place, and time.  Skin: Skin is warm and dry.  Psychiatric: She has a normal mood and affect. Thought content normal.    ED Course  Procedures (including critical care time)  Labs Reviewed - No data to display No results found.   No diagnosis found.    MDM  Bronchitis. Vaginal discharge        Cheri Guppy, MD 05/17/12 1610  Cheri Guppy, MD 05/17/12 [phone]

## 2012-05-17 NOTE — ED Notes (Signed)
Patient is alert and oriented x3.  She is complaining of vaginal discharge with odor That started 2-3 weeks ago.  She verifies that she has had unprotected  Sex.  She denies any pain.  She also complains of a dry cough.

## 2012-05-18 LAB — GC/CHLAMYDIA PROBE AMP, GENITAL
Chlamydia, DNA Probe: NEGATIVE
GC Probe Amp, Genital: NEGATIVE

## 2012-05-18 NOTE — ED Notes (Signed)
Patient is alert and oriented x3.  She was given DC instructions and follow up visit instructions.  Patient gave verbal understanding. She was DC ambulatory under his own power to home.  V/S stable.  He was not showing any signs of distress on DC 

## 2012-07-23 ENCOUNTER — Emergency Department (HOSPITAL_BASED_OUTPATIENT_CLINIC_OR_DEPARTMENT_OTHER)
Admission: EM | Admit: 2012-07-23 | Discharge: 2012-07-23 | Disposition: A | Discharge status: Home / Self Care | Payer: Self-pay | Attending: Emergency Medicine | Admitting: Emergency Medicine

## 2012-07-23 ENCOUNTER — Encounter (HOSPITAL_BASED_OUTPATIENT_CLINIC_OR_DEPARTMENT_OTHER): Payer: Self-pay | Admitting: Emergency Medicine

## 2012-07-23 DIAGNOSIS — Z8619 Personal history of other infectious and parasitic diseases: Secondary | ICD-10-CM | POA: Insufficient documentation

## 2012-07-23 DIAGNOSIS — N760 Acute vaginitis: Secondary | ICD-10-CM | POA: Insufficient documentation

## 2012-07-23 DIAGNOSIS — F411 Generalized anxiety disorder: Secondary | ICD-10-CM | POA: Insufficient documentation

## 2012-07-23 DIAGNOSIS — Z79899 Other long term (current) drug therapy: Secondary | ICD-10-CM | POA: Insufficient documentation

## 2012-07-23 DIAGNOSIS — L0291 Cutaneous abscess, unspecified: Secondary | ICD-10-CM | POA: Insufficient documentation

## 2012-07-23 MED ORDER — LIDOCAINE HCL (PF) 1 % IJ SOLN
5.0000 mL | Freq: Once | INTRAMUSCULAR | Status: DC
  Filled 2012-07-23: qty 5

## 2012-07-23 MED ORDER — TRAMADOL HCL 50 MG PO TABS: 50.0000 mg | ORAL_TABLET | Freq: Four times a day (QID) | ORAL | Status: DC | PRN

## 2012-07-23 MED ORDER — SULFAMETHOXAZOLE-TRIMETHOPRIM 400-80 MG PO TABS
2.0000 | ORAL_TABLET | Freq: Two times a day (BID) | ORAL | Status: DC
  Filled 2012-07-23: qty 2

## 2012-07-23 MED ORDER — SULFAMETHOXAZOLE-TMP DS 800-160 MG PO TABS
ORAL_TABLET | ORAL | Status: AC
  Administered 2012-07-23: 2
  Filled 2012-07-23: qty 2

## 2012-07-23 MED ORDER — HYDROCODONE-ACETAMINOPHEN 5-325 MG PO TABS
1.0000 | ORAL_TABLET | Freq: Once | ORAL | Status: AC
  Administered 2012-07-23: 1 via ORAL
  Filled 2012-07-23: qty 1

## 2012-07-23 MED ORDER — SULFAMETHOXAZOLE-TRIMETHOPRIM 800-160 MG PO TABS: 2.0000 | ORAL_TABLET | Freq: Two times a day (BID) | ORAL | Status: DC

## 2012-07-23 NOTE — ED Provider Notes (Signed)
History     CSN: [id number]  Arrival date & time 07/23/12  1191   First MD Initiated Contact with Patient 07/23/12 1902      Chief Complaint  Patient presents with  . Recurrent Skin Infections    (Consider location/radiation/quality/duration/timing/severity/associated sxs/prior treatment) HPI Comments: Patient comes to the ER for evaluation of skin abscess. Patient has a tender, swollen area on the left anterior thigh has been present for several days it is getting worse. It has not drained yet. She has not had a fever.   Past Medical History  Diagnosis Date  . Anxiety   . BV (bacterial vaginosis)   . Vaginal yeast infection     Past Surgical History  Procedure Date  . Hip surgery     Family History  Problem Relation Age of Onset  . Diabetes Father   . Cancer Father   . Hypertension Sister     History  Substance Use Topics  . Smoking status: Never Smoker   . Smokeless tobacco: Not on file  . Alcohol Use: Yes     Comment: Occasional    OB History    Grav Para Term Preterm Abortions TAB SAB Ect Mult Living                  Review of Systems  Constitutional: Negative for fever.  Skin: Positive for wound.    Allergies  Review of patient's allergies indicates no known allergies.  Home Medications   Current Outpatient Rx  Name  Route  Sig  Dispense  Refill  . BENZONATATE 100 MG PO CAPS   Oral   Take 1 capsule (100 mg total) by mouth every 8 (eight) hours.   21 capsule   0   . CEPHALEXIN 500 MG PO CAPS   Oral   Take 1 capsule (500 mg total) by mouth 4 (four) times daily.   28 capsule   0   . CLOTRIMAZOLE 1 % VA CREA   Vaginal   Place 1 Applicatorful vaginally daily.         Marland Kitchen METRONIDAZOLE 0.75 % VA GEL   Vaginal   Place 1 Applicatorful vaginally 2 (two) times daily.   70 g   0   . VITAMIN A & D 10000-400 UNITS PO CAPS   Oral   Take 1 capsule by mouth daily.           BP 115/74  Pulse 106  Temp 98.1 F (36.7 C)  Resp 18   SpO2 98%  LMP 07/11/2012  Physical Exam  Skin: Skin is warm. Lesion noted.       ED Course  Procedures (including critical care time)  Labs Reviewed - No data to display No results found.   Diagnosis: Skin abscess    MDM  Patient has a pustule on her leg with some surrounding erythema and induration indicative of skin abscess with overlying cellulitis. She began to spontaneously drain here in the ER when being prepped for incision and drainage and therefore drainage was performed. Patient will perform warm soaks on the area was initiated on antibiotic coverage. Return if area worsens.        Gilda Crease, MD 07/23/12 1949

## 2012-07-23 NOTE — ED Notes (Signed)
Pt with recurrent pus-filled wound on anterior left thigh. Had a previous wound like this that went away.

## 2012-08-15 ENCOUNTER — Emergency Department (HOSPITAL_BASED_OUTPATIENT_CLINIC_OR_DEPARTMENT_OTHER)
Admission: EM | Admit: 2012-08-15 | Discharge: 2012-08-15 | Disposition: A | Discharge status: Home / Self Care | Payer: Self-pay | Attending: Emergency Medicine | Admitting: Emergency Medicine

## 2012-08-15 ENCOUNTER — Encounter (HOSPITAL_BASED_OUTPATIENT_CLINIC_OR_DEPARTMENT_OTHER): Payer: Self-pay | Admitting: *Deleted

## 2012-08-15 DIAGNOSIS — B373 Candidiasis of vulva and vagina: Secondary | ICD-10-CM

## 2012-08-15 DIAGNOSIS — B9689 Other specified bacterial agents as the cause of diseases classified elsewhere: Secondary | ICD-10-CM

## 2012-08-15 DIAGNOSIS — N898 Other specified noninflammatory disorders of vagina: Secondary | ICD-10-CM | POA: Insufficient documentation

## 2012-08-15 DIAGNOSIS — Z3202 Encounter for pregnancy test, result negative: Secondary | ICD-10-CM | POA: Insufficient documentation

## 2012-08-15 DIAGNOSIS — B3731 Acute candidiasis of vulva and vagina: Secondary | ICD-10-CM | POA: Insufficient documentation

## 2012-08-15 DIAGNOSIS — Z8619 Personal history of other infectious and parasitic diseases: Secondary | ICD-10-CM | POA: Insufficient documentation

## 2012-08-15 HISTORY — DX: Candidiasis, unspecified: B37.9

## 2012-08-15 LAB — URINE MICROSCOPIC-ADD ON

## 2012-08-15 LAB — URINALYSIS, ROUTINE W REFLEX MICROSCOPIC
Bilirubin Urine: NEGATIVE
Glucose, UA: NEGATIVE mg/dL
Ketones, ur: NEGATIVE mg/dL
Specific Gravity, Urine: 1.025 (ref 1.005–1.030)
pH: 6 (ref 5.0–8.0)

## 2012-08-15 LAB — WET PREP, GENITAL: Trich, Wet Prep: NONE SEEN

## 2012-08-15 LAB — GLUCOSE, CAPILLARY: Glucose-Capillary: 95 mg/dL (ref 70–99)

## 2012-08-15 MED ORDER — FLUCONAZOLE 200 MG PO TABS: 200.0000 mg | ORAL_TABLET | Freq: Every day | ORAL | Status: AC

## 2012-08-15 MED ORDER — METRONIDAZOLE 0.75 % VA GEL: 1.0000 | Freq: Two times a day (BID) | VAGINAL | Status: DC

## 2012-08-15 NOTE — ED Notes (Addendum)
Pt states she has a hx of yeast infections. Now c/o vaginal itching. Taking OTC meds without relief. Father is a diabetic. FSBS 95 at triage.

## 2012-08-16 NOTE — ED Provider Notes (Signed)
History     CSN: [id number]  Arrival date & time 08/15/12  1205   First MD Initiated Contact with Patient 08/15/12 1421      Chief Complaint  Patient presents with  . Vaginal Itching    (Consider location/radiation/quality/duration/timing/severity/associated sxs/prior treatment) HPI Comments: Pt is a 27 yo woman with recurrent vaginal itching.  She has had prior treatment with Diflucan and with vaginal metonidazole.  She has tried over the counter remedies without relief.  She has no insurance and so has not seen anyone for treatment of this episode up to now.  Patient is a 27 y.o. female presenting with vaginal itching. The history is provided by the patient. No language interpreter was used.  Vaginal Itching This is a recurrent problem. The current episode started more than 1 week ago. The problem occurs constantly. The problem has not changed since onset.Associated symptoms comments: None . Nothing aggravates the symptoms. Nothing relieves the symptoms. Treatments tried: She has tried over-the-counter preparations without relief.    Past Medical History  Diagnosis Date  . Yeast infection     Past Surgical History  Procedure Laterality Date  . Hip surgery      History reviewed. No pertinent family history.  History  Substance Use Topics  . Smoking status: Never Smoker   . Smokeless tobacco: Not on file  . Alcohol Use: Yes    OB History   Grav Para Term Preterm Abortions TAB SAB Ect Mult Living                  Review of Systems  Constitutional: Negative for fever and chills.  HENT: Negative.   Eyes: Negative.   Respiratory: Negative.   Cardiovascular: Negative.   Gastrointestinal: Negative.   Endocrine: Negative for polydipsia, polyphagia and polyuria.  Genitourinary: Positive for vaginal discharge.  Musculoskeletal: Negative.   Skin: Negative.   Neurological: Negative.   Psychiatric/Behavioral: Negative.     Allergies  Review of patient's allergies  indicates no known allergies.  Home Medications   Current Outpatient Rx  Name  Route  Sig  Dispense  Refill  . chlorpheniramine-pseudoephedrine-acetaminophen (SINUTAB) 2-30-500 MG per tablet   Oral   Take 1 tablet by mouth every 4 (four) hours as needed. For allergies.         . ciprofloxacin (CIPRO) 500 MG tablet   Oral   Take 500 mg by mouth 2 (two) times daily. 3 day course of therapy; started 09/12/11         . fluconazole (DIFLUCAN) 200 MG tablet   Oral   Take 1 tablet (200 mg total) by mouth daily.   7 tablet   0   . HYDROcodone-acetaminophen (NORCO) 5-325 MG per tablet   Oral   Take 1 tablet by mouth every 6 (six) hours as needed. For pain.         . metroNIDAZOLE (METROGEL VAGINAL) 0.75 % vaginal gel   Vaginal   Place 1 Applicatorful vaginally 2 (two) times daily.   70 g   0   . miconazole (MONISTAT 1 COMBINATION PACK) kit   Vaginal   Place 1 each vaginally once.         . penicillin v potassium (VEETID) 500 MG tablet   Oral   Take 500 mg by mouth 4 (four) times daily.           BP 112/73  Pulse 103  Temp(Src) 98.4 F (36.9 C) (Oral)  Resp 18  Ht 5\' 3"  (1.6  m)  Wt 160 lb (72.576 kg)  BMI 28.35 kg/m2  SpO2 100%  LMP 08/14/2012  Physical Exam  Nursing note and vitals reviewed. Constitutional: She is oriented to person, place, and time. She appears well-developed and well-nourished. No distress.  HENT:  Head: Normocephalic and atraumatic.  Neck: Normal range of motion. Neck supple.  Abdominal: Soft. She exhibits no mass. There is no tenderness. There is no rebound and no guarding.  Genitourinary:  She has a mucopurulent discharge.  There is no uterine or adnexal tenderness.  Musculoskeletal: Normal range of motion. She exhibits edema. She exhibits no tenderness.  Neurological: She is alert and oriented to person, place, and time.  No sensory or motor deficit.  Skin: Skin is warm and dry.  Psychiatric: She has a normal mood and affect. Her  behavior is normal.    ED Course  Procedures (including critical care time)  Labs Reviewed  WET PREP, GENITAL - Abnormal; Notable for the following:    Yeast Wet Prep HPF POC FEW (*)    Clue Cells Wet Prep HPF POC MODERATE (*)    WBC, Wet Prep HPF POC FEW (*)    All other components within normal limits  URINALYSIS, ROUTINE W REFLEX MICROSCOPIC - Abnormal; Notable for the following:    APPearance CLOUDY (*)    Hgb urine dipstick LARGE (*)    Leukocytes, UA MODERATE (*)    All other components within normal limits  URINE MICROSCOPIC-ADD ON - Abnormal; Notable for the following:    Squamous Epithelial / LPF MANY (*)    Bacteria, UA MANY (*)    All other components within normal limits  URINE CULTURE  GC/CHLAMYDIA PROBE AMP  GLUCOSE, CAPILLARY  PREGNANCY, URINE  Rx for bacterial vaginitis with vaginal metronidazole (pt says she cannot swallow the metronidazole pill) and with diflucan for candidal vaginitis.    1. Bacterial vaginosis   2. Monilial vaginitis           Carleene Cooper III, MD 08/16/12 1325

## 2012-08-17 LAB — URINE CULTURE: Colony Count: 40000

## 2012-08-17 LAB — GC/CHLAMYDIA PROBE AMP: GC Probe RNA: NEGATIVE

## 2012-10-24 ENCOUNTER — Emergency Department (HOSPITAL_BASED_OUTPATIENT_CLINIC_OR_DEPARTMENT_OTHER)
Admission: EM | Admit: 2012-10-24 | Discharge: 2012-10-25 | Disposition: A | Discharge status: Home / Self Care | Payer: Self-pay | Attending: Emergency Medicine | Admitting: Emergency Medicine

## 2012-10-24 ENCOUNTER — Encounter (HOSPITAL_BASED_OUTPATIENT_CLINIC_OR_DEPARTMENT_OTHER): Payer: Self-pay | Admitting: *Deleted

## 2012-10-24 DIAGNOSIS — L0291 Cutaneous abscess, unspecified: Secondary | ICD-10-CM

## 2012-10-24 DIAGNOSIS — Z8619 Personal history of other infectious and parasitic diseases: Secondary | ICD-10-CM | POA: Insufficient documentation

## 2012-10-24 DIAGNOSIS — Z79899 Other long term (current) drug therapy: Secondary | ICD-10-CM | POA: Insufficient documentation

## 2012-10-24 DIAGNOSIS — Z3202 Encounter for pregnancy test, result negative: Secondary | ICD-10-CM | POA: Insufficient documentation

## 2012-10-24 DIAGNOSIS — N760 Acute vaginitis: Secondary | ICD-10-CM | POA: Insufficient documentation

## 2012-10-24 DIAGNOSIS — N898 Other specified noninflammatory disorders of vagina: Secondary | ICD-10-CM | POA: Insufficient documentation

## 2012-10-24 DIAGNOSIS — Z23 Encounter for immunization: Secondary | ICD-10-CM | POA: Insufficient documentation

## 2012-10-24 DIAGNOSIS — F411 Generalized anxiety disorder: Secondary | ICD-10-CM | POA: Insufficient documentation

## 2012-10-24 DIAGNOSIS — R300 Dysuria: Secondary | ICD-10-CM | POA: Insufficient documentation

## 2012-10-24 LAB — URINALYSIS, ROUTINE W REFLEX MICROSCOPIC
Ketones, ur: NEGATIVE mg/dL
Protein, ur: NEGATIVE mg/dL
Urobilinogen, UA: 1 mg/dL (ref 0.0–1.0)

## 2012-10-24 LAB — PREGNANCY, URINE: Preg Test, Ur: NEGATIVE

## 2012-10-24 NOTE — ED Notes (Signed)
Pt c/o vaginal irration for past several days but worse today. States she is having white vaginal d/c. Denies any fevers. C/o rash to vaginal area.

## 2012-10-24 NOTE — ED Provider Notes (Signed)
History  This chart was scribed for Prairie Stenberg Smitty Cords, MD by Greggory Stallion and Shari Heritage, ED Scribes. This patient was seen in room MH03/MH03 and the patient's care was started at 11:30 PM.    CSN: [id number]  Arrival date & time 10/24/12  2309     Chief Complaint  Patient presents with  . vaginal irritation     Patient is a 27 y.o. female presenting with vaginal discharge. The history is provided by the patient. No language interpreter was used.  Vaginal Discharge This is a recurrent problem. The current episode started more than 2 days ago. The problem occurs constantly. The problem has not changed since onset.Pertinent negatives include no abdominal pain. Nothing aggravates the symptoms. Nothing relieves the symptoms. She has tried nothing for the symptoms. The treatment provided no relief.    HPI Comments: Brenda West is a 27 y.o. female who presents to the Emergency Department complaining of persistent vaginal irritation, discharge and pain onset 4-5 days ago when she was given a new sex toy. Patient states it began significantly worsening 2-3 days ago. She rates pain 10/10. There is associated dysuria and white vaginal discharge. Patient suspects that she may have a rash to the vaginal area. Patient claims that she had a subjective fever earlier today. Temperature at triage was 98.5. She states that symptoms are improved with cool water while showing. Patient says that symptoms began after she used a new sex toy that her partner gifted. Patient denies nausea, vomiting, diarrhea, chills, sore throat, congestion, rhinorrhea, neck pain, neck stiffness, visual changes, back pain, headaches, shortness of breath, chest pain, abdominal pain, confusion or any other symptoms. She has a medical history of anxiety, vaginal yeast infection and bacterial vaginosis. Patient has never delivered a child, vaginally or by C-section.  Past Medical History  Diagnosis Date  . Anxiety   . BV  (bacterial vaginosis)   . Vaginal yeast infection     Past Surgical History  Procedure Laterality Date  . Hip surgery      Family History  Problem Relation Age of Onset  . Diabetes Father   . Cancer Father   . Hypertension Sister     History  Substance Use Topics  . Smoking status: Never Smoker   . Smokeless tobacco: Not on file  . Alcohol Use: Yes     Comment: Occasional    OB History   Grav Para Term Preterm Abortions TAB SAB Ect Mult Living                  Review of Systems  Constitutional: Negative for fever and chills.  HENT: Negative for congestion, sore throat, rhinorrhea, neck pain and neck stiffness.   Respiratory: Negative for cough.   Cardiovascular: Negative for palpitations and leg swelling.  Gastrointestinal: Negative for nausea, vomiting, abdominal pain and diarrhea.  Genitourinary: Positive for dysuria and vaginal discharge.  Musculoskeletal: Negative for back pain.  Hematological: Does not bruise/bleed easily.  Psychiatric/Behavioral: Negative for confusion.  All other systems reviewed and are negative.    Allergies  Review of patient's allergies indicates no known allergies.  Home Medications   Current Outpatient Rx  Name  Route  Sig  Dispense  Refill  . benzonatate (TESSALON) 100 MG capsule   Oral   Take 1 capsule (100 mg total) by mouth every 8 (eight) hours.   21 capsule   0   . cephALEXin (KEFLEX) 500 MG capsule   Oral   Take 1  capsule (500 mg total) by mouth 4 (four) times daily.   28 capsule   0   . clotrimazole (GYNE-LOTRIMIN) 1 % vaginal cream   Vaginal   Place 1 Applicatorful vaginally daily.         . metroNIDAZOLE (METROGEL VAGINAL) 0.75 % vaginal gel   Vaginal   Place 1 Applicatorful vaginally 2 (two) times daily.   70 g   0   . sulfamethoxazole-trimethoprim (SEPTRA DS) 800-160 MG per tablet   Oral   Take 2 tablets by mouth every 12 (twelve) hours.   40 tablet   0   . traMADol (ULTRAM) 50 MG tablet    Oral   Take 1 tablet (50 mg total) by mouth every 6 (six) hours as needed for pain.   15 tablet   0   . Vitamins A & D (VITAMIN A & D) 10000-400 UNITS CAPS   Oral   Take 1 capsule by mouth daily.           Triage Vitals: BP 113/76  Pulse 80  Temp(Src) 98.5 F (36.9 C) (Oral)  Resp 18  Ht 5\' 3"  (1.6 m)  Wt 173 lb (78.472 kg)  BMI 30.65 kg/m2  SpO2 100%  LMP 09/17/2012  Physical Exam  Constitutional: She is oriented to person, place, and time. She appears well-developed and well-nourished. No distress.  Non toxic appearing.  HENT:  Head: Normocephalic and atraumatic.  Right Ear: Tympanic membrane, external ear and ear canal normal.  Left Ear: Tympanic membrane, external ear and ear canal normal.  Mouth/Throat: Oropharynx is clear and moist. No oropharyngeal exudate.  Moist mucous membranes.  Eyes: Conjunctivae and EOM are normal. Pupils are equal, round, and reactive to light.  Neck: Normal range of motion. Neck supple.  Cardiovascular: Normal rate, regular rhythm, normal heart sounds and intact distal pulses.   No murmur heard. Pulmonary/Chest: Effort normal and breath sounds normal. No respiratory distress. She has no wheezes. She has no rales.  Abdominal: Soft. Bowel sounds are normal. She exhibits no distension and no mass. There is no tenderness. There is no rebound and no guarding.  Genitourinary:    Cervix exhibits discharge. Cervix exhibits no motion tenderness. Right adnexum displays no tenderness. Left adnexum displays no tenderness.  Cervical adherent yellow-white discharge. Abscess to the left vaginal wall draining thick green discharge. No overt fistula palpable between the rectum and the vagina. Chaperone present.  Musculoskeletal: Normal range of motion.  Neurological: She is alert and oriented to person, place, and time. She has normal reflexes.  Skin: Skin is warm and dry.  Psychiatric: Thought content normal.    ED Course  Procedures (including  critical care time)  DIAGNOSTIC STUDIES: Oxygen Saturation is 100% on RA, normal by my interpretation.    COORDINATION OF CARE: 11:58 PM-Discussed treatment plan which includes OB/GYN consult with pt at bedside and pt agreed to plan.  1:02 AM- Patient's cased discussed with Dorathy Kinsman, NP at [facility] who contacted Dr. Shawnie Pons who advised doxycycline and follow up with their clinic, patient to be called.     Labs Reviewed  WET PREP, GENITAL - Abnormal; Notable for the following:    WBC, Wet Prep HPF POC MANY (*)    All other components within normal limits  URINALYSIS, ROUTINE W REFLEX MICROSCOPIC - Abnormal; Notable for the following:    APPearance CLOUDY (*)    Hgb urine dipstick MODERATE (*)    Leukocytes, UA MODERATE (*)    All other  components within normal limits  URINE MICROSCOPIC-ADD ON - Abnormal; Notable for the following:    Squamous Epithelial / LPF MANY (*)    Bacteria, UA MANY (*)    All other components within normal limits  URINE CULTURE  GC/CHLAMYDIA PROBE AMP  PREGNANCY, URINE    No results found.   No diagnosis found.    MDM  Will start doxycycline and patient to be called with follow up     I personally performed the services described in this documentation, which was scribed in my presence. The recorded information has been reviewed and is accurate.     Ellinore Merced Smitty Cords, MD 10/25/12 0600

## 2012-10-25 ENCOUNTER — Telehealth (HOSPITAL_COMMUNITY): Payer: Self-pay

## 2012-10-25 LAB — WET PREP, GENITAL: Yeast Wet Prep HPF POC: NONE SEEN

## 2012-10-25 LAB — GC/CHLAMYDIA PROBE AMP: CT Probe RNA: NEGATIVE

## 2012-10-25 MED ORDER — TETANUS-DIPHTH-ACELL PERTUSSIS 5-2.5-18.5 LF-MCG/0.5 IM SUSP
0.5000 mL | Freq: Once | INTRAMUSCULAR | Status: AC
  Administered 2012-10-25: 0.5 mL via INTRAMUSCULAR
  Filled 2012-10-25: qty 0.5

## 2012-10-25 MED ORDER — DOXYCYCLINE HYCLATE 100 MG PO CAPS
100.0000 mg | ORAL_CAPSULE | Freq: Two times a day (BID) | ORAL | Status: DC
Start: 2012-10-25 — End: 2013-01-02

## 2012-10-25 MED ORDER — NITROFURANTOIN MONOHYD MACRO 100 MG PO CAPS: 100.0000 mg | ORAL_CAPSULE | Freq: Two times a day (BID) | ORAL | Status: DC

## 2012-10-25 MED ORDER — OXYCODONE-ACETAMINOPHEN 5-325 MG PO TABS
1.0000 | ORAL_TABLET | Freq: Once | ORAL | Status: DC
  Filled 2012-10-25 (×2): qty 1

## 2012-10-25 MED ORDER — OXYCODONE-ACETAMINOPHEN 5-325 MG PO TABS: 1.0000 | ORAL_TABLET | ORAL | Status: DC | PRN

## 2012-10-25 NOTE — ED Notes (Addendum)
MD at bedside to explain d/c instructions and f/u care.  Pt voiced understanding.

## 2012-10-26 LAB — URINE CULTURE: Colony Count: NO GROWTH

## 2012-11-01 ENCOUNTER — Encounter: Payer: Self-pay | Admitting: Obstetrics & Gynecology

## 2012-11-01 ENCOUNTER — Ambulatory Visit (INDEPENDENT_AMBULATORY_CARE_PROVIDER_SITE_OTHER): Payer: Self-pay | Admitting: Obstetrics & Gynecology

## 2012-11-01 VITALS — BP 113/71 | HR 77 | Temp 99.4°F | Ht 64.0 in | Wt 173.6 lb

## 2012-11-01 DIAGNOSIS — IMO0002 Reserved for concepts with insufficient information to code with codable children: Secondary | ICD-10-CM

## 2012-11-01 DIAGNOSIS — R6889 Other general symptoms and signs: Secondary | ICD-10-CM

## 2012-11-01 NOTE — Progress Notes (Signed)
Was seen at [facility] for Vaginal pain and burning with urination and discharge upon exam. Given doxycycline and sent her for follow up. Patient states never had vaginal discharge unless wiped and was whitish.  States missed one day of medicine, but still taking doxycycline. States burning has gone away.

## 2012-11-01 NOTE — Progress Notes (Signed)
  Subjective:    Patient ID: Brenda West, female    DOB: [birth year], 27 y.o.   MRN: [id number]  HPI  27 yo S AA G0 here for follow up after a visit to [facility] on 10-24-12 for a UTI and a vaginal discharge. She says that the burning with voiding has resolved. Her Cervical cultures were negative. Her wet prep only showed WBCs.   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 She had some care at [facility]. (She is unsure if she had a colpo or treatment.)  Objective:   Physical Exam  Normal discharge, cervix, and vagina, and bimanual      Assessment & Plan:  LGSIL 2012- I will get ROI for records, possibly need colpo if not already done. I will do a pap today. RTC 2 weeks

## 2012-11-02 ENCOUNTER — Encounter: Payer: Self-pay | Admitting: Obstetrics & Gynecology

## 2012-11-03 ENCOUNTER — Encounter: Payer: Self-pay | Admitting: *Deleted

## 2012-11-24 ENCOUNTER — Ambulatory Visit: Payer: Self-pay | Admitting: Obstetrics & Gynecology

## 2012-11-25 ENCOUNTER — Emergency Department (HOSPITAL_BASED_OUTPATIENT_CLINIC_OR_DEPARTMENT_OTHER)
Admission: EM | Admit: 2012-11-25 | Discharge: 2012-11-25 | Disposition: A | Discharge status: Home / Self Care | Payer: Self-pay | Attending: Emergency Medicine | Admitting: Emergency Medicine

## 2012-11-25 ENCOUNTER — Emergency Department (HOSPITAL_BASED_OUTPATIENT_CLINIC_OR_DEPARTMENT_OTHER): Payer: Self-pay

## 2012-11-25 ENCOUNTER — Encounter (HOSPITAL_BASED_OUTPATIENT_CLINIC_OR_DEPARTMENT_OTHER): Payer: Self-pay | Admitting: *Deleted

## 2012-11-25 DIAGNOSIS — F411 Generalized anxiety disorder: Secondary | ICD-10-CM | POA: Insufficient documentation

## 2012-11-25 DIAGNOSIS — S199XXA Unspecified injury of neck, initial encounter: Secondary | ICD-10-CM | POA: Insufficient documentation

## 2012-11-25 DIAGNOSIS — Z3202 Encounter for pregnancy test, result negative: Secondary | ICD-10-CM | POA: Insufficient documentation

## 2012-11-25 DIAGNOSIS — Y9389 Activity, other specified: Secondary | ICD-10-CM | POA: Insufficient documentation

## 2012-11-25 DIAGNOSIS — S79919A Unspecified injury of unspecified hip, initial encounter: Secondary | ICD-10-CM | POA: Insufficient documentation

## 2012-11-25 DIAGNOSIS — S0993XA Unspecified injury of face, initial encounter: Secondary | ICD-10-CM | POA: Insufficient documentation

## 2012-11-25 DIAGNOSIS — Z79899 Other long term (current) drug therapy: Secondary | ICD-10-CM | POA: Insufficient documentation

## 2012-11-25 DIAGNOSIS — Y92410 Unspecified street and highway as the place of occurrence of the external cause: Secondary | ICD-10-CM | POA: Insufficient documentation

## 2012-11-25 DIAGNOSIS — Z9889 Other specified postprocedural states: Secondary | ICD-10-CM | POA: Insufficient documentation

## 2012-11-25 DIAGNOSIS — Z8619 Personal history of other infectious and parasitic diseases: Secondary | ICD-10-CM | POA: Insufficient documentation

## 2012-11-25 DIAGNOSIS — K089 Disorder of teeth and supporting structures, unspecified: Secondary | ICD-10-CM | POA: Insufficient documentation

## 2012-11-25 LAB — PREGNANCY, URINE: Preg Test, Ur: NEGATIVE

## 2012-11-25 MED ORDER — NAPROXEN 250 MG PO TABS
500.0000 mg | ORAL_TABLET | Freq: Once | ORAL | Status: AC
  Administered 2012-11-25: 500 mg via ORAL
  Filled 2012-11-25: qty 2

## 2012-11-25 MED ORDER — NAPROXEN 250 MG PO TABS: 500.0000 mg | ORAL_TABLET | Freq: Once | ORAL | Status: DC

## 2012-11-25 MED ORDER — NAPROXEN SODIUM 550 MG PO TABS: 550.0000 mg | ORAL_TABLET | Freq: Two times a day (BID) | ORAL | Status: DC | PRN

## 2012-11-25 NOTE — ED Notes (Signed)
Patient transported to X-ray 

## 2012-11-25 NOTE — ED Notes (Signed)
MD at bedside. 
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
## 2012-11-28 ENCOUNTER — Emergency Department (HOSPITAL_BASED_OUTPATIENT_CLINIC_OR_DEPARTMENT_OTHER)
Admission: EM | Admit: 2012-11-28 | Discharge: 2012-11-29 | Disposition: A | Discharge status: Home / Self Care | Payer: Self-pay | Attending: Emergency Medicine | Admitting: Emergency Medicine

## 2012-11-28 ENCOUNTER — Encounter (HOSPITAL_BASED_OUTPATIENT_CLINIC_OR_DEPARTMENT_OTHER): Payer: Self-pay

## 2012-11-28 DIAGNOSIS — B3731 Acute candidiasis of vulva and vagina: Secondary | ICD-10-CM | POA: Insufficient documentation

## 2012-11-28 DIAGNOSIS — S161XXA Strain of muscle, fascia and tendon at neck level, initial encounter: Secondary | ICD-10-CM

## 2012-11-28 DIAGNOSIS — B9689 Other specified bacterial agents as the cause of diseases classified elsewhere: Secondary | ICD-10-CM | POA: Insufficient documentation

## 2012-11-28 DIAGNOSIS — M545 Low back pain, unspecified: Secondary | ICD-10-CM | POA: Insufficient documentation

## 2012-11-28 DIAGNOSIS — Z79899 Other long term (current) drug therapy: Secondary | ICD-10-CM | POA: Insufficient documentation

## 2012-11-28 DIAGNOSIS — S139XXA Sprain of joints and ligaments of unspecified parts of neck, initial encounter: Secondary | ICD-10-CM | POA: Insufficient documentation

## 2012-11-28 DIAGNOSIS — Y92410 Unspecified street and highway as the place of occurrence of the external cause: Secondary | ICD-10-CM | POA: Insufficient documentation

## 2012-11-28 DIAGNOSIS — A499 Bacterial infection, unspecified: Secondary | ICD-10-CM | POA: Insufficient documentation

## 2012-11-28 DIAGNOSIS — F411 Generalized anxiety disorder: Secondary | ICD-10-CM | POA: Insufficient documentation

## 2012-11-28 DIAGNOSIS — B373 Candidiasis of vulva and vagina: Secondary | ICD-10-CM | POA: Insufficient documentation

## 2012-11-28 DIAGNOSIS — Y9389 Activity, other specified: Secondary | ICD-10-CM | POA: Insufficient documentation

## 2012-11-28 DIAGNOSIS — N760 Acute vaginitis: Secondary | ICD-10-CM | POA: Insufficient documentation

## 2012-11-28 MED ORDER — HYDROCODONE-ACETAMINOPHEN 5-325 MG PO TABS: 2.0000 | ORAL_TABLET | ORAL | Status: DC | PRN
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
## 2012-12-01 ENCOUNTER — Emergency Department (HOSPITAL_BASED_OUTPATIENT_CLINIC_OR_DEPARTMENT_OTHER): Payer: Self-pay

## 2012-12-01 ENCOUNTER — Emergency Department (HOSPITAL_BASED_OUTPATIENT_CLINIC_OR_DEPARTMENT_OTHER)
Admission: EM | Admit: 2012-12-01 | Discharge: 2012-12-01 | Disposition: A | Discharge status: Home / Self Care | Payer: Self-pay | Attending: Emergency Medicine | Admitting: Emergency Medicine

## 2012-12-01 ENCOUNTER — Encounter (HOSPITAL_BASED_OUTPATIENT_CLINIC_OR_DEPARTMENT_OTHER): Payer: Self-pay | Admitting: *Deleted

## 2012-12-01 DIAGNOSIS — M25572 Pain in left ankle and joints of left foot: Secondary | ICD-10-CM

## 2012-12-01 DIAGNOSIS — W07XXXA Fall from chair, initial encounter: Secondary | ICD-10-CM | POA: Insufficient documentation

## 2012-12-01 DIAGNOSIS — S8990XA Unspecified injury of unspecified lower leg, initial encounter: Secondary | ICD-10-CM | POA: Insufficient documentation

## 2012-12-01 DIAGNOSIS — Y9389 Activity, other specified: Secondary | ICD-10-CM | POA: Insufficient documentation

## 2012-12-01 DIAGNOSIS — M79661 Pain in right lower leg: Secondary | ICD-10-CM

## 2012-12-01 DIAGNOSIS — Z792 Long term (current) use of antibiotics: Secondary | ICD-10-CM | POA: Insufficient documentation

## 2012-12-01 DIAGNOSIS — X500XXA Overexertion from strenuous movement or load, initial encounter: Secondary | ICD-10-CM | POA: Insufficient documentation

## 2012-12-01 DIAGNOSIS — Y9229 Other specified public building as the place of occurrence of the external cause: Secondary | ICD-10-CM | POA: Insufficient documentation

## 2012-12-01 DIAGNOSIS — Z8659 Personal history of other mental and behavioral disorders: Secondary | ICD-10-CM | POA: Insufficient documentation

## 2012-12-01 DIAGNOSIS — Z8619 Personal history of other infectious and parasitic diseases: Secondary | ICD-10-CM | POA: Insufficient documentation

## 2012-12-01 DIAGNOSIS — Z8742 Personal history of other diseases of the female genital tract: Secondary | ICD-10-CM | POA: Insufficient documentation

## 2012-12-01 MED ORDER — IBUPROFEN 800 MG PO TABS
800.0000 mg | ORAL_TABLET | Freq: Once | ORAL | Status: AC
  Administered 2012-12-01: 800 mg via ORAL
  Filled 2012-12-01 (×2): qty 1

## 2012-12-01 MED ORDER — IBUPROFEN 800 MG PO TABS: 800.0000 mg | ORAL_TABLET | Freq: Three times a day (TID) | ORAL | Status: DC

## 2012-12-01 NOTE — ED Provider Notes (Signed)
Medical screening examination/treatment/procedure(s) were performed by non-physician practitioner and as supervising physician I was immediately available for consultation/collaboration.   Shinita Mac, MD 12/01/12 2349 

## 2012-12-01 NOTE — ED Provider Notes (Signed)
History     CSN: [id number]  Arrival date & time 12/01/12  1951   First MD Initiated Contact with Patient 12/01/12 2034      Chief Complaint  Patient presents with  . Leg Pain    (Consider location/radiation/quality/duration/timing/severity/associated sxs/prior treatment) HPI Comments: Pt states that she was at a furniture store and she tripped over a piece of wood and it hit the front of her right leg and she twisted her left ankle:pt denies previous injury  Patient is a 27 y.o. female presenting with leg pain. The history is provided by the patient. No language interpreter was used.  Leg Pain Location:  Leg and ankle Injury: yes   Mechanism of injury: fall   Fall:    Fall occurred:  Standing   Impact surface:  Hard floor   Past Medical History  Diagnosis Date  . Anxiety   . BV (bacterial vaginosis)   . Vaginal yeast infection     Past Surgical History  Procedure Laterality Date  . Hip surgery      Family History  Problem Relation Age of Onset  . Diabetes Father   . Cancer Father   . Hypertension Sister     History  Substance Use Topics  . Smoking status: Never Smoker   . Smokeless tobacco: Never Used  . Alcohol Use: Yes     Comment: Occasional    OB History   Grav Para Term Preterm Abortions TAB SAB Ect Mult Living   0 0 0 0 0 0 0 0 0 0       Review of Systems  Constitutional: Negative.   Respiratory: Negative.   Cardiovascular: Negative.     Allergies  Review of patient's allergies indicates no known allergies.  Home Medications   Current Outpatient Rx  Name  Route  Sig  Dispense  Refill  . clotrimazole (GYNE-LOTRIMIN) 1 % vaginal cream   Vaginal   Place 1 Applicatorful vaginally daily.         Marland Kitchen doxycycline (VIBRAMYCIN) 100 MG capsule   Oral   Take 1 capsule (100 mg total) by mouth 2 (two) times daily. One po bid x 7 days   14 capsule   0   . HYDROcodone-acetaminophen (NORCO/VICODIN) 5-325 MG per tablet   Oral   Take 2 tablets  by mouth every 4 (four) hours as needed.   16 tablet   0   . metroNIDAZOLE (METROGEL VAGINAL) 0.75 % vaginal gel   Vaginal   Place 1 Applicatorful vaginally 2 (two) times daily.   70 g   0   . naproxen sodium (ANAPROX DS) 550 MG tablet   Oral   Take 1 tablet (550 mg total) by mouth 2 (two) times daily as needed ((for pain)).   30 tablet   0   . nitrofurantoin, macrocrystal-monohydrate, (MACROBID) 100 MG capsule   Oral   Take 1 capsule (100 mg total) by mouth 2 (two) times daily. X 7 days   14 capsule   0   . oxyCODONE-acetaminophen (PERCOCET) 5-325 MG per tablet   Oral   Take 1 tablet by mouth every 4 (four) hours as needed for pain.   10 tablet   0   . traMADol (ULTRAM) 50 MG tablet   Oral   Take 1 tablet (50 mg total) by mouth every 6 (six) hours as needed for pain.   15 tablet   0   . Vitamins A & D (VITAMIN A & D) 10000-400 UNITS  CAPS   Oral   Take 1 capsule by mouth daily.           BP 125/88  Pulse 90  Temp(Src) 99.3 F (37.4 C)  Resp 16  Ht 5\' 4"  (1.626 m)  Wt 170 lb (77.111 kg)  BMI 29.17 kg/m2  SpO2 100%  LMP 10/26/2012  Physical Exam  Nursing note and vitals reviewed. Constitutional: She is oriented to person, place, and time.  Cardiovascular: Normal rate and regular rhythm.   Pulmonary/Chest: Effort normal and breath sounds normal.  Musculoskeletal: Normal range of motion.  Pt has generalized tenderness to left ankle:pt has full rom:pt has no swelling or deformity noted to the right shin:pt ambulatory without any problem  Neurological: She is alert and oriented to person, place, and time.  Skin: Skin is warm and dry.    ED Course  Procedures (including critical care time)  Labs Reviewed - No data to display No results found.   No diagnosis found.    MDM  Pt left for dr. Manus Gunning to look at x-ray        Teressa Lower, NP 12/01/12 2244

## 2012-12-01 NOTE — ED Provider Notes (Signed)
Medical screening examination/treatment/procedure(s) were performed by non-physician practitioner and as supervising physician I was immediately available for consultation/collaboration.     Kimorah Ridolfi R Mileigh Tilley, MD 12/01/12 0757 

## 2012-12-01 NOTE — ED Notes (Signed)
Pt reports right lower leg pain after fall from chair

## 2013-01-02 ENCOUNTER — Emergency Department (HOSPITAL_BASED_OUTPATIENT_CLINIC_OR_DEPARTMENT_OTHER)
Admission: EM | Admit: 2013-01-02 | Discharge: 2013-01-02 | Disposition: A | Discharge status: Home / Self Care | Payer: Self-pay | Attending: Emergency Medicine | Admitting: Emergency Medicine

## 2013-01-02 ENCOUNTER — Encounter (HOSPITAL_BASED_OUTPATIENT_CLINIC_OR_DEPARTMENT_OTHER): Payer: Self-pay

## 2013-01-02 DIAGNOSIS — N898 Other specified noninflammatory disorders of vagina: Secondary | ICD-10-CM | POA: Insufficient documentation

## 2013-01-02 DIAGNOSIS — Z202 Contact with and (suspected) exposure to infections with a predominantly sexual mode of transmission: Secondary | ICD-10-CM | POA: Insufficient documentation

## 2013-01-02 DIAGNOSIS — Z8659 Personal history of other mental and behavioral disorders: Secondary | ICD-10-CM | POA: Insufficient documentation

## 2013-01-02 DIAGNOSIS — Z8619 Personal history of other infectious and parasitic diseases: Secondary | ICD-10-CM | POA: Insufficient documentation

## 2013-01-02 DIAGNOSIS — Z3202 Encounter for pregnancy test, result negative: Secondary | ICD-10-CM | POA: Insufficient documentation

## 2013-01-02 DIAGNOSIS — Z8742 Personal history of other diseases of the female genital tract: Secondary | ICD-10-CM | POA: Insufficient documentation

## 2013-01-02 LAB — URINALYSIS, ROUTINE W REFLEX MICROSCOPIC
Bilirubin Urine: NEGATIVE
Glucose, UA: NEGATIVE mg/dL
Ketones, ur: NEGATIVE mg/dL
pH: 7 (ref 5.0–8.0)

## 2013-01-02 LAB — WET PREP, GENITAL: Yeast Wet Prep HPF POC: NONE SEEN

## 2013-01-02 MED ORDER — AZITHROMYCIN 250 MG PO TABS
1000.0000 mg | ORAL_TABLET | Freq: Once | ORAL | Status: AC
  Administered 2013-01-02: 1000 mg via ORAL
  Filled 2013-01-02: qty 4

## 2013-01-02 MED ORDER — CEFTRIAXONE SODIUM 250 MG IJ SOLR
250.0000 mg | Freq: Once | INTRAMUSCULAR | Status: AC
  Administered 2013-01-02: 250 mg via INTRAMUSCULAR
  Filled 2013-01-02: qty 250

## 2013-01-02 NOTE — ED Notes (Signed)
Patient here requesting check for possible STD. Denies symptoms but did have unprotected sex.

## 2013-01-02 NOTE — ED Provider Notes (Signed)
History    CSN: [id number] Arrival date & time 01/02/13  2129  First MD Initiated Contact with Patient 01/02/13 2233     Chief Complaint  Patient presents with  . SEXUALLY TRANSMITTED DISEASE   (Consider location/radiation/quality/duration/timing/severity/associated sxs/prior Treatment) HPI Comments: Patient presents requesting checked for STDs. She currently denies any symptoms. She went home he lies she is here today to get checked out, she just says that she and her boyfriend both him and make sure they don't have any STDs. She denies any vaginal bleeding or discharge. She denies abdominal pain. She denies he nausea or vomiting. She denies any rashes. She has a history of bacterial vaginosis.  Past Medical History  Diagnosis Date  . Anxiety   . BV (bacterial vaginosis)   . Vaginal yeast infection    Past Surgical History  Procedure Laterality Date  . Hip surgery     Family History  Problem Relation Age of Onset  . Diabetes Father   . Cancer Father   . Hypertension Sister    History  Substance Use Topics  . Smoking status: Never Smoker   . Smokeless tobacco: Never Used  . Alcohol Use: Yes     Comment: Occasional   OB History   Grav Para Term Preterm Abortions TAB SAB Ect Mult Living   0 0 0 0 0 0 0 0 0 0      Review of Systems  Constitutional: Negative for fever, chills, diaphoresis and fatigue.  HENT: Negative for congestion, rhinorrhea and sneezing.   Eyes: Negative.   Respiratory: Negative for cough, chest tightness and shortness of breath.   Cardiovascular: Negative for chest pain and leg swelling.  Gastrointestinal: Negative for nausea, vomiting, abdominal pain, diarrhea and blood in stool.  Genitourinary: Negative for frequency, hematuria, flank pain and difficulty urinating.  Musculoskeletal: Negative for back pain and arthralgias.  Skin: Negative for rash.  Neurological: Negative for dizziness, speech difficulty, weakness, numbness and headaches.     Allergies  Review of patient's allergies indicates no known allergies.  Home Medications   Current Outpatient Rx  Name  Route  Sig  Dispense  Refill  . clotrimazole (GYNE-LOTRIMIN) 1 % vaginal cream   Vaginal   Place 1 Applicatorful vaginally daily.         . Vitamins A & D (VITAMIN A & D) 10000-400 UNITS CAPS   Oral   Take 1 capsule by mouth daily.          BP 123/83  Pulse 82  Temp(Src) 98.2 F (36.8 C) (Oral)  Resp 16  Ht 5\' 4"  (1.626 m)  Wt 170 lb (77.111 kg)  BMI 29.17 kg/m2  SpO2 100% Physical Exam  Constitutional: She is oriented to person, place, and time. She appears well-developed and well-nourished.  HENT:  Head: Normocephalic and atraumatic.  Eyes: Pupils are equal, round, and reactive to light.  Neck: Normal range of motion. Neck supple.  Cardiovascular: Normal rate, regular rhythm and normal heart sounds.   Pulmonary/Chest: Effort normal and breath sounds normal. No respiratory distress. She has no wheezes. She has no rales. She exhibits no tenderness.  Abdominal: Soft. Bowel sounds are normal. There is no tenderness. There is no rebound and no guarding.  Genitourinary:  There is positive thick white discharge. There is no cervical motion tenderness. No adnexal tenderness.  Musculoskeletal: Normal range of motion. She exhibits no edema.  Lymphadenopathy:    She has no cervical adenopathy.  Neurological: She is alert and oriented to  person, place, and time.  Skin: Skin is warm and dry. No rash noted.  Psychiatric: She has a normal mood and affect.    ED Course  Procedures (including critical care time) Results for orders placed during the hospital encounter of 01/02/13  WET PREP, GENITAL      Result Value Range   Yeast Wet Prep HPF POC NONE SEEN  NONE SEEN   Trich, Wet Prep NONE SEEN  NONE SEEN   Clue Cells Wet Prep HPF POC FEW (*) NONE SEEN   WBC, Wet Prep HPF POC FEW (*) NONE SEEN  URINALYSIS, ROUTINE W REFLEX MICROSCOPIC      Result  Value Range   Color, Urine YELLOW  YELLOW   APPearance CLEAR  CLEAR   Specific Gravity, Urine 1.026  1.005 - 1.030   pH 7.0  5.0 - 8.0   Glucose, UA NEGATIVE  NEGATIVE mg/dL   Hgb urine dipstick NEGATIVE  NEGATIVE   Bilirubin Urine NEGATIVE  NEGATIVE   Ketones, ur NEGATIVE  NEGATIVE mg/dL   Protein, ur NEGATIVE  NEGATIVE mg/dL   Urobilinogen, UA 1.0  0.0 - 1.0 mg/dL   Nitrite NEGATIVE  NEGATIVE   Leukocytes, UA NEGATIVE  NEGATIVE  PREGNANCY, URINE      Result Value Range   Preg Test, Ur NEGATIVE  NEGATIVE   No results found.   No results found. 1. Exposure to STD     MDM  Since patient does discharge I will go ahead and treat her for gonorrhea and Chlamydia. She states she's been recently tested for HIV and it was negative. I advised her to followup with the health department if she chooses for ongoing repeat HIV testing are syphilis testing. She did not feel like she needed that tonight. She does have a few clue cells but she has [employer] at home to use.  Rolan Bucco, MD 01/02/13 [phone]

## 2013-01-03 LAB — GC/CHLAMYDIA PROBE AMP: GC Probe RNA: NEGATIVE

## 2013-02-01 ENCOUNTER — Emergency Department (HOSPITAL_BASED_OUTPATIENT_CLINIC_OR_DEPARTMENT_OTHER)
Admission: EM | Admit: 2013-02-01 | Discharge: 2013-02-01 | Disposition: A | Discharge status: Home / Self Care | Payer: Self-pay | Attending: Emergency Medicine | Admitting: Emergency Medicine

## 2013-02-01 ENCOUNTER — Encounter (HOSPITAL_BASED_OUTPATIENT_CLINIC_OR_DEPARTMENT_OTHER): Payer: Self-pay | Admitting: Emergency Medicine

## 2013-02-01 DIAGNOSIS — Z79899 Other long term (current) drug therapy: Secondary | ICD-10-CM | POA: Insufficient documentation

## 2013-02-01 DIAGNOSIS — K089 Disorder of teeth and supporting structures, unspecified: Secondary | ICD-10-CM | POA: Insufficient documentation

## 2013-02-01 DIAGNOSIS — Z8659 Personal history of other mental and behavioral disorders: Secondary | ICD-10-CM | POA: Insufficient documentation

## 2013-02-01 DIAGNOSIS — K0889 Other specified disorders of teeth and supporting structures: Secondary | ICD-10-CM

## 2013-02-01 DIAGNOSIS — Z8742 Personal history of other diseases of the female genital tract: Secondary | ICD-10-CM | POA: Insufficient documentation

## 2013-02-01 DIAGNOSIS — Z8619 Personal history of other infectious and parasitic diseases: Secondary | ICD-10-CM | POA: Insufficient documentation

## 2013-02-01 MED ORDER — CLINDAMYCIN HCL 300 MG PO CAPS: 300.0000 mg | ORAL_CAPSULE | Freq: Four times a day (QID) | ORAL | Status: DC

## 2013-02-01 MED ORDER — HYDROCODONE-ACETAMINOPHEN 5-325 MG PO TABS: 2.0000 | ORAL_TABLET | ORAL | Status: DC | PRN

## 2013-02-01 NOTE — ED Provider Notes (Signed)
Medical screening examination/treatment/procedure(s) were performed by non-physician practitioner and as supervising physician I was immediately available for consultation/collaboration.   Charles B. Sheldon, MD 02/01/13 2354 

## 2013-02-01 NOTE — ED Notes (Signed)
Pt reports that wisdom teeth started hurting several days ago, took two percocet today with no relief

## 2013-02-01 NOTE — ED Provider Notes (Signed)
  CSN: [id number]     Arrival date & time 02/01/13  2025 History     First MD Initiated Contact with Patient 02/01/13 2100     Chief Complaint  Patient presents with  . Dental Pain   (Consider location/radiation/quality/duration/timing/severity/associated sxs/prior Treatment) Patient is a 27 y.o. female presenting with tooth pain. The history is provided by the patient. No language interpreter was used.  Dental Pain Location:  Upper and lower Lower teeth location:  17/LL 3rd molar and 31/RL 2nd molar Pt states her wisdom teeth jurt  Past Medical History  Diagnosis Date  . Anxiety   . BV (bacterial vaginosis)   . Vaginal yeast infection    Past Surgical History  Procedure Laterality Date  . Hip surgery     Family History  Problem Relation Age of Onset  . Diabetes Father   . Cancer Father   . Hypertension Sister    History  Substance Use Topics  . Smoking status: Never Smoker   . Smokeless tobacco: Never Used  . Alcohol Use: Yes     Comment: Occasional   OB History   Grav Para Term Preterm Abortions TAB SAB Ect Mult Living   0 0 0 0 0 0 0 0 0 0      Review of Systems  HENT: Positive for dental problem.   All other systems reviewed and are negative.    Allergies  Review of patient's allergies indicates no known allergies.  Home Medications   Current Outpatient Rx  Name  Route  Sig  Dispense  Refill  . clotrimazole (GYNE-LOTRIMIN) 1 % vaginal cream   Vaginal   Place 1 Applicatorful vaginally daily.         . Vitamins A & D (VITAMIN A & D) 10000-400 UNITS CAPS   Oral   Take 1 capsule by mouth daily.          BP 116/76  Pulse 82  Temp(Src) 98.6 F (37 C) (Oral)  Resp 14  Ht 5\' 4"  (1.626 m)  Wt 170 lb (77.111 kg)  BMI 29.17 kg/m2  SpO2 98%  LMP 01/30/2013 Physical Exam  Nursing note and vitals reviewed. Constitutional: She is oriented to person, place, and time. She appears well-developed and well-nourished.  HENT:  Head: Normocephalic.   Nose: Nose normal.  Mouth/Throat: Oropharynx is clear and moist.  No obv abscess  Eyes: Pupils are equal, round, and reactive to light.  Neck: Normal range of motion. Neck supple.  Cardiovascular: Normal rate and regular rhythm.   Pulmonary/Chest: Effort normal and breath sounds normal.  Musculoskeletal: Normal range of motion.  Neurological: She is alert and oriented to person, place, and time. She has normal reflexes.  Skin: Skin is warm.  Psychiatric: She has a normal mood and affect.    ED Course   Procedures (including critical care time)  Labs Reviewed - No data to display No results found. No diagnosis found.  MDM  Clindamycin and hydrocodone  Lonia Skinner [place], [place] 02/01/13 2123

## 2013-02-01 NOTE — ED Notes (Signed)
In triage, pt stated that she got her percocet filled today and took two of them prior to coming to er. Pt states that the percocet did not help with the pain of her wisdom tooth, in triage, pt unable to answer questions correctly, stated she weighted 17 pounds instead of 170 pounds, unable to keep eyes open when talking to rn, sluggish demenour, alert and oriented,

## 2013-02-02 ENCOUNTER — Encounter (HOSPITAL_BASED_OUTPATIENT_CLINIC_OR_DEPARTMENT_OTHER): Payer: Self-pay

## 2013-02-02 ENCOUNTER — Emergency Department (HOSPITAL_BASED_OUTPATIENT_CLINIC_OR_DEPARTMENT_OTHER)
Admission: EM | Admit: 2013-02-02 | Discharge: 2013-02-02 | Disposition: A | Discharge status: Home / Self Care | Payer: Self-pay | Attending: Emergency Medicine | Admitting: Emergency Medicine

## 2013-02-02 DIAGNOSIS — Z79899 Other long term (current) drug therapy: Secondary | ICD-10-CM | POA: Insufficient documentation

## 2013-02-02 DIAGNOSIS — Z792 Long term (current) use of antibiotics: Secondary | ICD-10-CM | POA: Insufficient documentation

## 2013-02-02 DIAGNOSIS — B379 Candidiasis, unspecified: Secondary | ICD-10-CM | POA: Insufficient documentation

## 2013-02-02 DIAGNOSIS — K9049 Malabsorption due to intolerance, not elsewhere classified: Secondary | ICD-10-CM

## 2013-02-02 DIAGNOSIS — K9089 Other intestinal malabsorption: Secondary | ICD-10-CM | POA: Insufficient documentation

## 2013-02-02 DIAGNOSIS — K089 Disorder of teeth and supporting structures, unspecified: Secondary | ICD-10-CM | POA: Insufficient documentation

## 2013-02-02 DIAGNOSIS — K0889 Other specified disorders of teeth and supporting structures: Secondary | ICD-10-CM

## 2013-02-02 MED ORDER — OXYCODONE-ACETAMINOPHEN 5-325 MG PO TABS
1.0000 | ORAL_TABLET | Freq: Once | ORAL | Status: AC
  Administered 2013-02-02: 1 via ORAL
  Filled 2013-02-02 (×2): qty 1

## 2013-02-02 MED ORDER — CLINDAMYCIN HCL 150 MG PO CAPS
300.0000 mg | ORAL_CAPSULE | Freq: Once | ORAL | Status: AC
  Administered 2013-02-02: 300 mg via ORAL
  Filled 2013-02-02: qty 2

## 2013-02-02 MED ORDER — ONDANSETRON 4 MG PO TBDP
4.0000 mg | ORAL_TABLET | Freq: Once | ORAL | Status: AC
  Administered 2013-02-02: 4 mg via ORAL
  Filled 2013-02-02: qty 1

## 2013-02-02 NOTE — ED Notes (Addendum)
Pt c/o "big brown black thing in the chicken"-sandwich from [employer] approx 730pm-pt reports vomited x 2-NAD-sister is here with same c/o after eating other 1/2 of sandwich

## 2013-02-03 NOTE — ED Provider Notes (Signed)
CSN: [id number]     Arrival date & time 02/02/13  2045 History     First MD Initiated Contact with Patient 02/02/13 2236     Chief Complaint  Patient presents with  . Emesis   (Consider location/radiation/quality/duration/timing/severity/associated sxs/prior Treatment) Patient is a 27 y.o. female presenting with vomiting. The history is provided by the patient.  Emesis Severity:  Mild (pt eating a [employer] sandwich today and when bit into the chicken there was something hard which caused her to vomit up the chicken and then later vomitted the rest of the sandwich.  came here becaused worried about food poisoning.) Duration:  1 hour Timing:  Rare Number of daily episodes:  1 Quality:  Stomach contents Progression:  Resolved Chronicity:  New Context: self-induced   Associated symptoms: no abdominal pain, no chills, no diarrhea and no fever   Risk factors: suspect food intake   Risk factors: no alcohol use, no diabetes and not pregnant now     Past Medical History  Diagnosis Date  . Yeast infection    Past Surgical History  Procedure Laterality Date  . Hip surgery     No family history on file. History  Substance Use Topics  . Smoking status: Never Smoker   . Smokeless tobacco: Not on file  . Alcohol Use: Yes   OB History   Grav Para Term Preterm Abortions TAB SAB Ect Mult Living                 Review of Systems  Constitutional: Negative for chills.  HENT:       Dental pain that is currently worse.  Seen yesterday for wisdom tooth and needs to take her meds cause the pain is coming back.  Gastrointestinal: Positive for vomiting. Negative for abdominal pain and diarrhea.  All other systems reviewed and are negative.    Allergies  Review of patient's allergies indicates no known allergies.  Home Medications   Current Outpatient Rx  Name  Route  Sig  Dispense  Refill  . CLINDAMYCIN HCL PO   Oral   Take by mouth.         . Ibuprofen (ADVIL PO)    Oral   Take by mouth.         . Oxycodone-Acetaminophen (PERCOCET PO)   Oral   Take by mouth.         . chlorpheniramine-pseudoephedrine-acetaminophen (SINUTAB) 2-30-500 MG per tablet   Oral   Take 1 tablet by mouth every 4 (four) hours as needed. For allergies.         . ciprofloxacin (CIPRO) 500 MG tablet   Oral   Take 500 mg by mouth 2 (two) times daily. 3 day course of therapy; started 09/12/11         . HYDROcodone-acetaminophen (NORCO) 5-325 MG per tablet   Oral   Take 1 tablet by mouth every 6 (six) hours as needed. For pain.         . metroNIDAZOLE (METROGEL VAGINAL) 0.75 % vaginal gel   Vaginal   Place 1 Applicatorful vaginally 2 (two) times daily.   70 g   0   . miconazole (MONISTAT 1 COMBINATION PACK) kit   Vaginal   Place 1 each vaginally once.         . penicillin v potassium (VEETID) 500 MG tablet   Oral   Take 500 mg by mouth 4 (four) times daily.          BP  128/75  Pulse 102  Temp(Src) 99.1 F (37.3 C) (Oral)  Resp 16  Ht 5\' 3"  (1.6 m)  Wt 170 lb (77.111 kg)  BMI 30.12 kg/m2  SpO2 100%  LMP 12/29/2012 Physical Exam  Nursing note and vitals reviewed. Constitutional: She is oriented to person, place, and time. She appears well-developed and well-nourished. No distress.  HENT:  Head: Normocephalic and atraumatic.  Mouth/Throat: Oropharynx is clear and moist.    Eyes: Conjunctivae and EOM are normal. Pupils are equal, round, and reactive to light.  Neck: Normal range of motion. Neck supple.  Cardiovascular: Normal rate, regular rhythm and intact distal pulses.   No murmur heard. Pulmonary/Chest: Effort normal and breath sounds normal. No respiratory distress. She has no wheezes. She has no rales.  Abdominal: Soft. She exhibits no distension. There is no tenderness. There is no rebound and no guarding.  Musculoskeletal: Normal range of motion. She exhibits no edema and no tenderness.  Neurological: She is alert and oriented to person,  place, and time.  Skin: Skin is warm and dry. No rash noted. No erythema.  Psychiatric: She has a normal mood and affect. Her behavior is normal.    ED Course   Procedures (including critical care time)  Labs Reviewed - No data to display No results found. 1. Pain, dental   2. Food intolerance     MDM   Patient is here because she ate a [employer] sandwich today that had a bad piece of chicken in it which caused her to gag and vomit. She wanted to make sure she didn't have Salmonella. Patient has had no further vomiting, abdominal pain or diarrhea.) The patient that if she contracted Salmonella it would not show up until 6-12 hours after ingestion. Patient vomited right after eating the chicken and feel that this was not related to an infectious etiology.  Of note patient was seen yesterday for dental issues and states that her tooth is hurting badly and she wants to go out to her car to get her pain medicine but they have not been able to her prescription yet and that medication will not be ready for tomorrow. Patient given Percocet and clindamycin here.  Gwyneth Sprout, MD 02/03/13 [phone]

## 2013-02-03 NOTE — ED Notes (Signed)
Pt presented to ED with photo ID and sts she needs discharge paperwork reprinted. Paperwork provided.

## 2013-02-26 ENCOUNTER — Emergency Department (HOSPITAL_BASED_OUTPATIENT_CLINIC_OR_DEPARTMENT_OTHER)
Admission: EM | Admit: 2013-02-26 | Discharge: 2013-02-26 | Disposition: A | Discharge status: Home / Self Care | Payer: Self-pay | Attending: Emergency Medicine | Admitting: Emergency Medicine

## 2013-02-26 ENCOUNTER — Encounter (HOSPITAL_BASED_OUTPATIENT_CLINIC_OR_DEPARTMENT_OTHER): Payer: Self-pay | Admitting: Emergency Medicine

## 2013-02-26 DIAGNOSIS — Z8659 Personal history of other mental and behavioral disorders: Secondary | ICD-10-CM | POA: Insufficient documentation

## 2013-02-26 DIAGNOSIS — Z8619 Personal history of other infectious and parasitic diseases: Secondary | ICD-10-CM | POA: Insufficient documentation

## 2013-02-26 DIAGNOSIS — Z79899 Other long term (current) drug therapy: Secondary | ICD-10-CM | POA: Insufficient documentation

## 2013-02-26 DIAGNOSIS — Z8742 Personal history of other diseases of the female genital tract: Secondary | ICD-10-CM | POA: Insufficient documentation

## 2013-02-26 DIAGNOSIS — K089 Disorder of teeth and supporting structures, unspecified: Secondary | ICD-10-CM | POA: Insufficient documentation

## 2013-02-26 DIAGNOSIS — R51 Headache: Secondary | ICD-10-CM | POA: Insufficient documentation

## 2013-02-26 DIAGNOSIS — K0889 Other specified disorders of teeth and supporting structures: Secondary | ICD-10-CM

## 2013-02-26 MED ORDER — KETOROLAC TROMETHAMINE 60 MG/2ML IM SOLN
60.0000 mg | Freq: Once | INTRAMUSCULAR | Status: AC
  Administered 2013-02-26: 60 mg via INTRAMUSCULAR
  Filled 2013-02-26: qty 2

## 2013-02-26 NOTE — ED Notes (Signed)
Provided patient with information regarding dental services & provided education on pain management for pain. Patient was upset because the MD did not provide a prescription for narcotics.

## 2013-02-26 NOTE — ED Provider Notes (Signed)
CSN: [id number]     Arrival date & time 02/26/13  4696 History     First MD Initiated Contact with Patient 02/26/13 0703     Chief Complaint  Patient presents with  . Dental Pain   (Consider location/radiation/quality/duration/timing/severity/associated sxs/prior Treatment) Patient is a 27 y.o. female presenting with tooth pain.  Dental Pain Location:  Upper and lower Upper teeth location:  16/LU 3rd molar Lower teeth location:  17/LL 3rd molar Quality:  Throbbing Severity:  Severe Onset quality:  Gradual Duration:  1 month Timing:  Intermittent Progression:  Worsening Chronicity:  New Context comment:  "my wisdom teeth are coming in" Relieved by: percocet, vicodin. Exacerbated by: eating. Associated symptoms: facial pain and headaches   Associated symptoms: no difficulty swallowing, no facial swelling, no fever, no gum swelling, no oral bleeding and no trismus     Past Medical History  Diagnosis Date  . Anxiety   . BV (bacterial vaginosis)   . Vaginal yeast infection    Past Surgical History  Procedure Laterality Date  . Hip surgery     Family History  Problem Relation Age of Onset  . Diabetes Father   . Cancer Father   . Hypertension Sister    History  Substance Use Topics  . Smoking status: Never Smoker   . Smokeless tobacco: Never Used  . Alcohol Use: Yes     Comment: Occasional   OB History   Grav Para Term Preterm Abortions TAB SAB Ect Mult Living   0 0 0 0 0 0 0 0 0 0      Review of Systems  Constitutional: Negative for fever.  HENT: Negative for facial swelling.   Genitourinary: Negative for menstrual problem (refused pregnancy test "I'm not pregnant!" ).  Neurological: Positive for headaches.  All other systems reviewed and are negative.    Allergies  Review of patient's allergies indicates no known allergies.  Home Medications   Current Outpatient Rx  Name  Route  Sig  Dispense  Refill  . clindamycin (CLEOCIN) 300 MG capsule   Oral   Take 1 capsule (300 mg total) by mouth every 6 (six) hours.   28 capsule   0   . clotrimazole (GYNE-LOTRIMIN) 1 % vaginal cream   Vaginal   Place 1 Applicatorful vaginally daily.         Marland Kitchen HYDROcodone-acetaminophen (NORCO/VICODIN) 5-325 MG per tablet   Oral   Take 2 tablets by mouth every 4 (four) hours as needed for pain.   10 tablet   0   . Vitamins A & D (VITAMIN A & D) 10000-400 UNITS CAPS   Oral   Take 1 capsule by mouth daily.          BP 131/80  Pulse 80  Temp(Src) 98.4 F (36.9 C) (Oral)  Resp 18  Ht 5\' 5"  (1.651 m)  Wt 170 lb (77.111 kg)  BMI 28.29 kg/m2  SpO2 100%  LMP 01/30/2013 Physical Exam  Nursing note and vitals reviewed. Constitutional: She is oriented to person, place, and time. She appears well-developed and well-nourished. No distress.  HENT:  Head: Normocephalic and atraumatic.  Right Ear: Tympanic membrane and ear canal normal.  Left Ear: Tympanic membrane and ear canal normal.  Mouth/Throat: Oropharynx is clear and moist. No trismus in the jaw. No dental abscesses.  TTP 16, 17.  No signs of abscess.  Eyes: Conjunctivae are normal. No scleral icterus.  Neck: Neck supple. No edema and no erythema present.  No stridor  Cardiovascular: Normal rate, regular rhythm and intact distal pulses.   Pulmonary/Chest: Effort normal. No stridor. No respiratory distress.  Abdominal: Normal appearance.  Musculoskeletal: Normal range of motion.  Neurological: She is alert and oriented to person, place, and time.  Skin: Skin is warm and dry. No rash noted.  Psychiatric: She has a normal mood and affect. Her behavior is normal.    ED Course   Procedures (including critical care time)  Labs Reviewed - No data to display No results found. 1. Pain, dental     MDM  2nd visit for same dental pain, which she attributes to wisdom teeth eruption.  Has not seen dentist or oral surgeon.  Wants refill of narcotic pain medications today.  Nontoxic, no airway  compromise, no trismus or signs of deep tissue infection.  She did not complete antibiotics prescribed from last visit, but her pain seems to be mechanical from 3rd molars erupting.  Advised scheduled NSAIDs for pain.  I do not think additional narcotics is in this patient's best interest.  Will again provide dentistry resources.    Candyce Churn, MD 02/26/13 [phone]

## 2013-02-26 NOTE — ED Notes (Signed)
Pt c/o dental pain x 1 month 

## 2013-03-16 ENCOUNTER — Encounter (HOSPITAL_BASED_OUTPATIENT_CLINIC_OR_DEPARTMENT_OTHER): Payer: Self-pay

## 2013-03-16 ENCOUNTER — Emergency Department (HOSPITAL_BASED_OUTPATIENT_CLINIC_OR_DEPARTMENT_OTHER)
Admission: EM | Admit: 2013-03-16 | Discharge: 2013-03-16 | Disposition: A | Discharge status: Home / Self Care | Payer: Self-pay | Attending: Emergency Medicine | Admitting: Emergency Medicine

## 2013-03-16 DIAGNOSIS — K006 Disturbances in tooth eruption: Secondary | ICD-10-CM | POA: Insufficient documentation

## 2013-03-16 DIAGNOSIS — K011 Impacted teeth: Secondary | ICD-10-CM

## 2013-03-16 DIAGNOSIS — Z79899 Other long term (current) drug therapy: Secondary | ICD-10-CM | POA: Insufficient documentation

## 2013-03-16 DIAGNOSIS — Z8659 Personal history of other mental and behavioral disorders: Secondary | ICD-10-CM | POA: Insufficient documentation

## 2013-03-16 DIAGNOSIS — Z8619 Personal history of other infectious and parasitic diseases: Secondary | ICD-10-CM | POA: Insufficient documentation

## 2013-03-16 DIAGNOSIS — M26609 Unspecified temporomandibular joint disorder, unspecified side: Secondary | ICD-10-CM | POA: Insufficient documentation

## 2013-03-16 DIAGNOSIS — Z792 Long term (current) use of antibiotics: Secondary | ICD-10-CM | POA: Insufficient documentation

## 2013-03-16 DIAGNOSIS — Z8742 Personal history of other diseases of the female genital tract: Secondary | ICD-10-CM | POA: Insufficient documentation

## 2013-03-16 MED ORDER — CYCLOBENZAPRINE HCL 10 MG PO TABS: 10.0000 mg | ORAL_TABLET | Freq: Three times a day (TID) | ORAL | Status: DC | PRN

## 2013-03-16 MED ORDER — KETOROLAC TROMETHAMINE 60 MG/2ML IM SOLN
60.0000 mg | Freq: Once | INTRAMUSCULAR | Status: AC
  Administered 2013-03-16: 60 mg via INTRAMUSCULAR
  Filled 2013-03-16: qty 2

## 2013-03-16 MED ORDER — HYDROCODONE-ACETAMINOPHEN 5-325 MG PO TABS: 1.0000 | ORAL_TABLET | Freq: Four times a day (QID) | ORAL | Status: DC | PRN

## 2013-03-16 NOTE — ED Notes (Signed)
Pt reports was seen here recently for dental pain, tearful and moaning in triage, no tears noted.  Reports wisdom teeth problems in left upper and lower with radiation of pain to head. Appears in nad.

## 2013-03-16 NOTE — ED Notes (Signed)
Pt called out asking for pain meds, informed md will be in to see here as soon as possible.  Will have to wait for eval.

## 2013-03-16 NOTE — ED Provider Notes (Signed)
CSN: [id number]     Arrival date & time 03/16/13  0132 History   None    Chief Complaint  Patient presents with  . Dental Pain   (Consider location/radiation/quality/duration/timing/severity/associated sxs/prior Treatment) HPI Is a 27 year old female with several months history of pain in her left wisdom teeth which have not erupted. She has been seen several times for this in the ED. She states she's been unable to arrange followup. She is here with "severe" pain in her left upper and lower wisdom teeth. The pain radiates to her entire head and makes her head feel like it is going to explode. She characterizes the pain as like that of prior episodes of wisdom tooth pain. It hurts to move her jaw and speak. The pain is somewhat relieved by drinking water. She has taken ibuprofen without relief.  Past Medical History  Diagnosis Date  . Anxiety   . BV (bacterial vaginosis)   . Vaginal yeast infection    Past Surgical History  Procedure Laterality Date  . Hip surgery     Family History  Problem Relation Age of Onset  . Diabetes Father   . Cancer Father   . Hypertension Sister    History  Substance Use Topics  . Smoking status: Never Smoker   . Smokeless tobacco: Never Used  . Alcohol Use: Yes     Comment: Occasional   OB History   Grav Para Term Preterm Abortions TAB SAB Ect Mult Living   0 0 0 0 0 0 0 0 0 0      Review of Systems  All other systems reviewed and are negative.    Allergies  Review of patient's allergies indicates no known allergies.  Home Medications   Current Outpatient Rx  Name  Route  Sig  Dispense  Refill  . clindamycin (CLEOCIN) 300 MG capsule   Oral   Take 1 capsule (300 mg total) by mouth every 6 (six) hours.   28 capsule   0   . clotrimazole (GYNE-LOTRIMIN) 1 % vaginal cream   Vaginal   Place 1 Applicatorful vaginally daily.         Marland Kitchen HYDROcodone-acetaminophen (NORCO/VICODIN) 5-325 MG per tablet   Oral   Take 2 tablets by mouth  every 4 (four) hours as needed for pain.   10 tablet   0   . Vitamins A & D (VITAMIN A & D) 10000-400 UNITS CAPS   Oral   Take 1 capsule by mouth daily.          BP 127/79  Pulse 78  Temp(Src) 98.3 F (36.8 C) (Oral)  Resp 18  Ht 5\' 5"  (1.651 m)  Wt 170 lb (77.111 kg)  BMI 28.29 kg/m2  SpO2 100%  Physical Exam General: Well-developed, well-nourished female in no acute distress; appearance consistent with age of record HENT: normocephalic; atraumatic; unerupted wisdom teeth, tender to percussion on the left; tender left TMJ with pain on movement of jaw Eyes: pupils equal, round and reactive to light; extraocular muscles intact Neck: supple Heart: regular rate and rhythm Lungs: Normal respiratory effort and excursion Abdomen: soft; nondistended Extremities: No deformity; full range of motion Neurologic: Awake, alert and oriented; motor function intact in all extremities and symmetric; no facial droop Skin: Warm and dry Psychiatric: Tearful    ED Course  Procedures (including critical care time)  MDM       Hanley Seamen, MD 03/16/13 0201

## 2013-03-16 NOTE — ED Notes (Signed)
Pt requesting dental block before she is sent home.

## 2013-03-16 NOTE — ED Notes (Signed)
MD at bedside. 

## 2013-10-19 ENCOUNTER — Ambulatory Visit (INDEPENDENT_AMBULATORY_CARE_PROVIDER_SITE_OTHER): Payer: PRIVATE HEALTH INSURANCE | Admitting: Family Medicine

## 2013-10-19 VITALS — BP 118/70 | HR 93 | Temp 98.5°F | Resp 16 | Ht 63.5 in | Wt 188.8 lb

## 2013-10-19 DIAGNOSIS — J309 Allergic rhinitis, unspecified: Secondary | ICD-10-CM

## 2013-10-19 DIAGNOSIS — R05 Cough: Secondary | ICD-10-CM

## 2013-10-19 DIAGNOSIS — J302 Other seasonal allergic rhinitis: Secondary | ICD-10-CM

## 2013-10-19 DIAGNOSIS — R059 Cough, unspecified: Secondary | ICD-10-CM

## 2013-10-19 MED ORDER — MONTELUKAST SODIUM 10 MG PO TABS: 10.0000 mg | ORAL_TABLET | Freq: Every day | ORAL | Status: DC

## 2013-10-19 MED ORDER — BENZONATATE 100 MG PO CAPS: 200.0000 mg | ORAL_CAPSULE | Freq: Two times a day (BID) | ORAL | Status: DC | PRN

## 2013-10-19 MED ORDER — HYDROCODONE-HOMATROPINE 5-1.5 MG/5ML PO SYRP: 5.0000 mL | ORAL_SOLUTION | Freq: Every evening | ORAL | Status: DC | PRN

## 2013-10-19 NOTE — Patient Instructions (Signed)

## 2013-10-19 NOTE — Progress Notes (Signed)
Chief Complaint:  Chief Complaint  Patient presents with  . Cough    non-productive x1 month  . Chills  . Nasal Congestion    HPI: Brenda West is a 28 y.o. female who is here for  Nasal congestion, non productive cough x 1 month, and also chills that is intermittent She is on the phone a lot and she has had laryngitis due to cough she wants to be evaluated.  Eyes are dry and in the past she has had itchy eyes  No ear pain or facial pain  The nurse at her work last week checked her tonsil and ears and told her she had red tonsils and also drainage in her ears. 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 She has a history of allergy. She has tried claritin, zyrtec and otc cough meds without relief. No sinus pain, no HA, no drainage. No sore throat. She denies SOB or wheezing.     Past Medical History  Diagnosis Date  . Anxiety   . BV (bacterial vaginosis)   . Vaginal yeast infection   . Allergy    Past Surgical History  Procedure Laterality Date  . Hip surgery     History   Social History  . Marital Status: Single    Spouse Name: N/A    Number of Children: N/A  . Years of Education: N/A   Social History Main Topics  . Smoking status: Never Smoker   . Smokeless tobacco: Never Used  . Alcohol Use: Yes     Comment: Occasional  . Drug Use: No  . Sexual Activity: Yes    Birth Control/ Protection: None   Other Topics Concern  . None   Social History Narrative  . None   Family History  Problem Relation Age of Onset  . Diabetes Father   . Cancer Father   . Hypertension Sister    No Known Allergies Prior to Admission medications   Medication Sig Start Date End Date Taking? Authorizing Provider  clindamycin (CLEOCIN) 300 MG capsule Take 1 capsule (300 mg total) by mouth every 6 (six) hours. 02/01/13   Elson AreasLeslie K Sofia, PA-C  clotrimazole (GYNE-LOTRIMIN) 1 % vaginal cream Place 1 Applicatorful vaginally  daily.    Historical Provider, MD  cyclobenzaprine (FLEXERIL) 10 MG tablet Take 1 tablet (10 mg total) by mouth 3 (three) times daily as needed for muscle spasms. 03/16/13   Carlisle BeersJohn L Molpus, MD  HYDROcodone-acetaminophen (NORCO/VICODIN) 5-325 MG per tablet Take 2 tablets by mouth every 4 (four) hours as needed for pain. 02/01/13   Elson AreasLeslie K Sofia, PA-C  HYDROcodone-acetaminophen (NORCO/VICODIN) 5-325 MG per tablet Take 1-2 tablets by mouth every 6 (six) hours as needed for pain. 03/16/13   Carlisle BeersJohn L Molpus, MD  Vitamins A & D (VITAMIN A & D) 10000-400 UNITS CAPS Take 1 capsule by mouth daily.    Historical Provider, MD     ROS: The patient denies fevers,  night sweats, unintentional weight loss, chest pain, palpitations, wheezing, dyspnea on exertion, nausea, vomiting, abdominal pain, dysuria, hematuria, melena, numbness, weakness, or tingling.   All other systems have been reviewed and were otherwise negative with the exception of those mentioned in the HPI and as above.    PHYSICAL EXAM: Filed Vitals:   10/19/13 1956  BP: 118/70  Pulse: 93  Temp: 98.5 F (36.9 C)  Resp: 16  Filed Vitals:   10/19/13 1956  Height: 5' 3.5" (1.613 m)  Weight: 188 lb 12.8 oz (85.639 kg)   Body mass index is 32.92 kg/(m^2).  General: Alert, no acute distress HEENT:  Normocephalic, atraumatic, oropharynx patent. EOMI, PERRLA, Tm nl, nontender sinuses, +erythematous boggy nares  Cardiovascular:  Regular rate and rhythm, no rubs murmurs or gallops.  No Carotid bruits, radial pulse intact. No pedal edema.  Respiratory: Clear to auscultation bilaterally.  No wheezes, rales, or rhonchi.  No cyanosis, no use of accessory musculature GI: No organomegaly, abdomen is soft and non-tender, positive bowel sounds.  No masses. Skin: No rashes. Neurologic: Facial musculature symmetric. Psychiatric: Patient is appropriate throughout our interaction. Lymphatic: No cervical lymphadenopathy Musculoskeletal: Gait  intact.   LABS: Results for orders placed during the hospital encounter of 01/02/13  GC/CHLAMYDIA PROBE AMP      Result Value Ref Range   CT Probe RNA NEGATIVE  NEGATIVE   GC Probe RNA NEGATIVE  NEGATIVE  WET PREP, GENITAL      Result Value Ref Range   Yeast Wet Prep HPF POC NONE SEEN  NONE SEEN   Trich, Wet Prep NONE SEEN  NONE SEEN   Clue Cells Wet Prep HPF POC FEW (*) NONE SEEN   WBC, Wet Prep HPF POC FEW (*) NONE SEEN  URINALYSIS, ROUTINE W REFLEX MICROSCOPIC      Result Value Ref Range   Color, Urine YELLOW  YELLOW   APPearance CLEAR  CLEAR   Specific Gravity, Urine 1.026  1.005 - 1.030   pH 7.0  5.0 - 8.0   Glucose, UA NEGATIVE  NEGATIVE mg/dL   Hgb urine dipstick NEGATIVE  NEGATIVE   Bilirubin Urine NEGATIVE  NEGATIVE   Ketones, ur NEGATIVE  NEGATIVE mg/dL   Protein, ur NEGATIVE  NEGATIVE mg/dL   Urobilinogen, UA 1.0  0.0 - 1.0 mg/dL   Nitrite NEGATIVE  NEGATIVE   Leukocytes, UA NEGATIVE  NEGATIVE  PREGNANCY, URINE      Result Value Ref Range   Preg Test, Ur NEGATIVE  NEGATIVE     EKG/XRAY:   Primary read [facility]   ASSESSMENT/PLAN: Encounter Diagnoses  Name Primary?  . Allergic rhinitis Yes  . Cough   . Seasonal allergies    Pleasant 28 y/o female with a 3-4 week history of URI sxs and allergy symptoms , she has tried otc meds ie claritin and zyrtec and cough meds without relief . Her cough ahs been dry. She denies GERD.  Will continue with zyrtec Start otc nasacort Start singulair, hycodan and tessalon perles prn Declined chest xray offer at this time F/u prn , she will make annual visit by calling our number  Gross sideeffects, risk and benefits, and alternatives of medications d/w patient. Patient is aware that all medications have potential sideeffects and we are unable to predict every sideeffect or drug-drug interaction that may occur.  Lenell Antuhao P Le, DO 10/19/2013 8:26 PM

## 2014-01-16 ENCOUNTER — Emergency Department (HOSPITAL_COMMUNITY): Payer: No Typology Code available for payment source

## 2014-01-16 ENCOUNTER — Encounter (HOSPITAL_COMMUNITY): Payer: Self-pay | Admitting: Emergency Medicine

## 2014-01-16 ENCOUNTER — Emergency Department (HOSPITAL_COMMUNITY)
Admission: EM | Admit: 2014-01-16 | Discharge: 2014-01-16 | Disposition: A | Discharge status: Home / Self Care | Payer: No Typology Code available for payment source | Attending: Emergency Medicine | Admitting: Emergency Medicine

## 2014-01-16 DIAGNOSIS — T07XXXA Unspecified multiple injuries, initial encounter: Secondary | ICD-10-CM | POA: Insufficient documentation

## 2014-01-16 DIAGNOSIS — B9689 Other specified bacterial agents as the cause of diseases classified elsewhere: Secondary | ICD-10-CM | POA: Insufficient documentation

## 2014-01-16 DIAGNOSIS — Y939 Activity, unspecified: Secondary | ICD-10-CM | POA: Insufficient documentation

## 2014-01-16 DIAGNOSIS — F411 Generalized anxiety disorder: Secondary | ICD-10-CM | POA: Insufficient documentation

## 2014-01-16 DIAGNOSIS — B3731 Acute candidiasis of vulva and vagina: Secondary | ICD-10-CM | POA: Insufficient documentation

## 2014-01-16 DIAGNOSIS — B373 Candidiasis of vulva and vagina: Secondary | ICD-10-CM | POA: Insufficient documentation

## 2014-01-16 DIAGNOSIS — A499 Bacterial infection, unspecified: Secondary | ICD-10-CM | POA: Insufficient documentation

## 2014-01-16 DIAGNOSIS — Z79899 Other long term (current) drug therapy: Secondary | ICD-10-CM | POA: Insufficient documentation

## 2014-01-16 DIAGNOSIS — N760 Acute vaginitis: Secondary | ICD-10-CM | POA: Insufficient documentation

## 2014-01-16 MED ORDER — IBUPROFEN 800 MG PO TABS: 800.0000 mg | ORAL_TABLET | Freq: Three times a day (TID) | ORAL | Status: DC | PRN

## 2014-01-16 MED ORDER — HYDROCODONE-ACETAMINOPHEN 5-325 MG PO TABS: 1.0000 | ORAL_TABLET | Freq: Four times a day (QID) | ORAL | Status: DC | PRN

## 2014-01-16 NOTE — ED Notes (Signed)
Ebbie Ridgehris Lawyer PA at bedside.
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
## 2014-01-17 NOTE — ED Provider Notes (Signed)
Medical screening examination/treatment/procedure(s) were performed by non-physician practitioner and as supervising physician I was immediately available for consultation/collaboration.   EKG Interpretation None       Nakeesha Bowler, MD 01/17/14 0849 

## 2014-01-19 ENCOUNTER — Encounter (HOSPITAL_BASED_OUTPATIENT_CLINIC_OR_DEPARTMENT_OTHER): Payer: Self-pay | Admitting: Emergency Medicine

## 2014-01-19 ENCOUNTER — Emergency Department (HOSPITAL_BASED_OUTPATIENT_CLINIC_OR_DEPARTMENT_OTHER)
Admission: EM | Admit: 2014-01-19 | Discharge: 2014-01-20 | Discharge status: Against Medical Advice | Payer: PRIVATE HEALTH INSURANCE | Attending: Emergency Medicine | Admitting: Emergency Medicine

## 2014-01-19 DIAGNOSIS — Z79899 Other long term (current) drug therapy: Secondary | ICD-10-CM | POA: Insufficient documentation

## 2014-01-19 DIAGNOSIS — S161XXD Strain of muscle, fascia and tendon at neck level, subsequent encounter: Secondary | ICD-10-CM

## 2014-01-19 DIAGNOSIS — K0889 Other specified disorders of teeth and supporting structures: Secondary | ICD-10-CM | POA: Insufficient documentation

## 2014-01-19 DIAGNOSIS — Z8619 Personal history of other infectious and parasitic diseases: Secondary | ICD-10-CM | POA: Insufficient documentation

## 2014-01-19 DIAGNOSIS — Z791 Long term (current) use of non-steroidal anti-inflammatories (NSAID): Secondary | ICD-10-CM | POA: Insufficient documentation

## 2014-01-19 DIAGNOSIS — M549 Dorsalgia, unspecified: Secondary | ICD-10-CM | POA: Insufficient documentation

## 2014-01-19 DIAGNOSIS — N898 Other specified noninflammatory disorders of vagina: Secondary | ICD-10-CM

## 2014-01-19 DIAGNOSIS — Z3202 Encounter for pregnancy test, result negative: Secondary | ICD-10-CM | POA: Insufficient documentation

## 2014-01-19 DIAGNOSIS — R109 Unspecified abdominal pain: Secondary | ICD-10-CM | POA: Insufficient documentation

## 2014-01-19 DIAGNOSIS — Z792 Long term (current) use of antibiotics: Secondary | ICD-10-CM | POA: Insufficient documentation

## 2014-01-19 DIAGNOSIS — M542 Cervicalgia: Secondary | ICD-10-CM | POA: Insufficient documentation

## 2014-01-19 DIAGNOSIS — G8911 Acute pain due to trauma: Secondary | ICD-10-CM | POA: Insufficient documentation

## 2014-01-19 LAB — URINALYSIS, ROUTINE W REFLEX MICROSCOPIC
Bilirubin Urine: NEGATIVE
GLUCOSE, UA: 100 mg/dL — AB
Ketones, ur: 15 mg/dL — AB
Nitrite: NEGATIVE
PROTEIN: NEGATIVE mg/dL
Specific Gravity, Urine: 1.033 — ABNORMAL HIGH (ref 1.005–1.030)
UROBILINOGEN UA: 1 mg/dL (ref 0.0–1.0)
pH: 6 (ref 5.0–8.0)

## 2014-01-19 LAB — URINE MICROSCOPIC-ADD ON

## 2014-01-19 LAB — PREGNANCY, URINE: PREG TEST UR: NEGATIVE

## 2014-01-19 MED ORDER — MORPHINE SULFATE 4 MG/ML IJ SOLN
4.0000 mg | Freq: Once | INTRAMUSCULAR | Status: DC
  Filled 2014-01-19: qty 1
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
## 2014-01-23 ENCOUNTER — Emergency Department (HOSPITAL_BASED_OUTPATIENT_CLINIC_OR_DEPARTMENT_OTHER)
Admission: EM | Admit: 2014-01-23 | Discharge: 2014-01-23 | Disposition: A | Discharge status: Home / Self Care | Payer: No Typology Code available for payment source | Attending: Emergency Medicine | Admitting: Emergency Medicine

## 2014-01-23 ENCOUNTER — Encounter (HOSPITAL_BASED_OUTPATIENT_CLINIC_OR_DEPARTMENT_OTHER): Payer: Self-pay | Admitting: Emergency Medicine

## 2014-01-23 DIAGNOSIS — M546 Pain in thoracic spine: Secondary | ICD-10-CM | POA: Insufficient documentation

## 2014-01-23 DIAGNOSIS — A5901 Trichomonal vulvovaginitis: Secondary | ICD-10-CM | POA: Insufficient documentation

## 2014-01-23 DIAGNOSIS — N760 Acute vaginitis: Secondary | ICD-10-CM | POA: Insufficient documentation

## 2014-01-23 DIAGNOSIS — M542 Cervicalgia: Secondary | ICD-10-CM | POA: Insufficient documentation

## 2014-01-23 DIAGNOSIS — N898 Other specified noninflammatory disorders of vagina: Secondary | ICD-10-CM

## 2014-01-23 DIAGNOSIS — M791 Myalgia, unspecified site: Secondary | ICD-10-CM

## 2014-01-23 DIAGNOSIS — A599 Trichomoniasis, unspecified: Secondary | ICD-10-CM

## 2014-01-23 DIAGNOSIS — G8911 Acute pain due to trauma: Secondary | ICD-10-CM | POA: Insufficient documentation

## 2014-01-23 DIAGNOSIS — A499 Bacterial infection, unspecified: Secondary | ICD-10-CM | POA: Insufficient documentation

## 2014-01-23 DIAGNOSIS — B9689 Other specified bacterial agents as the cause of diseases classified elsewhere: Secondary | ICD-10-CM | POA: Insufficient documentation

## 2014-01-23 DIAGNOSIS — Z8659 Personal history of other mental and behavioral disorders: Secondary | ICD-10-CM | POA: Insufficient documentation

## 2014-01-23 LAB — WET PREP, GENITAL: YEAST WET PREP: NONE SEEN

## 2014-01-23 MED ORDER — CEFTRIAXONE SODIUM 250 MG IJ SOLR
250.0000 mg | Freq: Once | INTRAMUSCULAR | Status: AC
  Administered 2014-01-23: 250 mg via INTRAMUSCULAR
  Filled 2014-01-23: qty 250

## 2014-01-23 MED ORDER — METRONIDAZOLE 500 MG PO TABS: 500.0000 mg | ORAL_TABLET | Freq: Two times a day (BID) | ORAL | Status: DC

## 2014-01-23 MED ORDER — CEFTRIAXONE SODIUM 250 MG IJ SOLR: 250.0000 mg | Freq: Once | INTRAMUSCULAR | Status: DC

## 2014-01-23 MED ORDER — LIDOCAINE HCL (PF) 1 % IJ SOLN
INTRAMUSCULAR | Status: AC
  Administered 2014-01-23: 0.9 mL
  Filled 2014-01-23: qty 5

## 2014-01-23 MED ORDER — AZITHROMYCIN 250 MG PO TABS: 1000.0000 mg | ORAL_TABLET | Freq: Once | ORAL | Status: DC

## 2014-01-23 MED ORDER — AZITHROMYCIN 250 MG PO TABS
1000.0000 mg | ORAL_TABLET | Freq: Once | ORAL | Status: AC
  Administered 2014-01-23: 1000 mg via ORAL
  Filled 2014-01-23: qty 4
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
## 2014-01-23 NOTE — ED Notes (Signed)
Pt called and wanting a prescription for flagyl gel. EDP and Pharmacist explained to patient that the pills were more effective to treat her. Pt still requesting gel prescription. Dr. Rosalia Hammersay and Pharmacist to fill prescription and I will call patient back.

## 2014-01-23 NOTE — ED Notes (Signed)
Pt refused all blood. Pt states "I just had blood work drawn."  MD made aware.

## 2014-01-23 NOTE — Discharge Instructions (Signed)
Trichomoniasis Trichomoniasis is an infection caused by an organism called Trichomonas. The infection can affect both women and men. In women, the outer female genitalia and the vagina are affected. In men, the penis is mainly affected, but the prostate and other reproductive organs can also be involved. Trichomoniasis is a sexually transmitted infection (STI) and is most often passed to another person through sexual contact.  RISK FACTORS  Having unprotected sexual intercourse.  Having sexual intercourse with an infected partner. SIGNS AND SYMPTOMS  Symptoms of trichomoniasis in women include:  Abnormal gray-green frothy vaginal discharge.  Itching and irritation of the vagina.  Itching and irritation of the area outside the vagina. Symptoms of trichomoniasis in men include:   Penile discharge with or without pain.  Pain during urination. This results from inflammation of the urethra. DIAGNOSIS  Trichomoniasis may be found during a Pap test or physical exam. Your health care provider may use one of the following methods to help diagnose this infection:  Examining vaginal discharge under a microscope. For men, urethral discharge would be examined.  Testing the pH of the vagina with a test tape.  Using a vaginal swab test that checks for the Trichomonas organism. A test is available that provides results within a few minutes.  Doing a culture test for the organism. This is not usually needed. TREATMENT   You may be given medicine to fight the infection. Women should inform their health care provider if they could be or are pregnant. Some medicines used to treat the infection should not be taken during pregnancy.  Your health care provider may recommend over-the-counter medicines or creams to decrease itching or irritation.  Your sexual partner will need to be treated if infected. HOME CARE INSTRUCTIONS   Take all medicine prescribed by your health care provider.  Take  over-the-counter medicine for itching or irritation as directed by your health care provider.  Do not have sexual intercourse while you have the infection.  Women should not douche or wear tampons while they have the infection.  Discuss your infection with your partner. Your partner may have gotten the infection from you, or you may have gotten it from your partner.  Have your sex partner get examined and treated if necessary.  Practice safe, informed, and protected sex.  See your health care provider for other STI testing. SEEK MEDICAL CARE IF:   You still have symptoms after you finish your medicine.  You develop abdominal pain.  You have pain when you urinate.  You have bleeding after sexual intercourse.  You develop a rash.  Your medicine makes you sick or makes you throw up (vomit). Document Released: 12/17/2000 Document Revised: 06/28/2013 Document Reviewed: 04/04/2013 [facility] Patient Information 2015 [place], [place]. This information is not intended to replace advice given to you by your health care provider. Make sure you discuss any questions you have with your health care provider. Bacterial Vaginosis Bacterial vaginosis is a vaginal infection that occurs when the normal balance of bacteria in the vagina is disrupted. It results from an overgrowth of certain bacteria. This is the most common vaginal infection in women of childbearing age. Treatment is important to prevent complications, especially in pregnant women, as it can cause a premature delivery. CAUSES  Bacterial vaginosis is caused by an increase in harmful bacteria that are normally present in smaller amounts in the vagina. Several different kinds of bacteria can cause bacterial vaginosis. However, the reason that the condition develops is not fully understood. RISK FACTORS  Certain activities or behaviors can put you at an increased risk of developing bacterial vaginosis, including:  Having a new sex partner or  multiple sex partners.  Douching.  Using an intrauterine device (IUD) for contraception. Women do not get bacterial vaginosis from toilet seats, bedding, swimming pools, or contact with objects around them. SIGNS AND SYMPTOMS  Some women with bacterial vaginosis have no signs or symptoms. Common symptoms include:  Grey vaginal discharge.  A fishlike odor with discharge, especially after sexual intercourse.  Itching or burning of the vagina and vulva.  Burning or pain with urination. DIAGNOSIS  Your health care provider will take a medical history and examine the vagina for signs of bacterial vaginosis. A sample of vaginal fluid may be taken. Your health care provider will look at this sample under a microscope to check for bacteria and abnormal cells. A vaginal pH test may also be done.  TREATMENT  Bacterial vaginosis may be treated with antibiotic medicines. These may be given in the form of a pill or a vaginal cream. A second round of antibiotics may be prescribed if the condition comes back after treatment.  HOME CARE INSTRUCTIONS   Only take over-the-counter or prescription medicines as directed by your health care provider.  If antibiotic medicine was prescribed, take it as directed. Make sure you finish it even if you start to feel better.  Do not have sex until treatment is completed.  Tell all sexual partners that you have a vaginal infection. They should see their health care provider and be treated if they have problems, such as a mild rash or itching.  Practice safe sex by using condoms and only having one sex partner. SEEK MEDICAL CARE IF:   Your symptoms are not improving after 3 days of treatment.  You have increased discharge or pain.  You have a fever. MAKE SURE YOU:   Understand these instructions.  Will watch your condition.  Will get help right away if you are not doing well or get worse. FOR MORE INFORMATION  Centers for Disease Control and  Prevention, Division of STD Prevention: [employer]s.co.za American Sexual Health Association (ASHA): www.ashastd.org  Document Released: 06/23/2005 Document Revised: 04/13/2013 Document Reviewed: 02/02/2013 [facility] Patient Information 2015 [place], [place]. This information is not intended to replace advice given to you by your health care provider. Make sure you discuss any questions you have with your health care provider.
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
## 2014-01-24 LAB — GC/CHLAMYDIA PROBE AMP
CT Probe RNA: NEGATIVE
GC PROBE AMP APTIMA: NEGATIVE

## 2014-03-06 ENCOUNTER — Encounter (HOSPITAL_BASED_OUTPATIENT_CLINIC_OR_DEPARTMENT_OTHER): Payer: Self-pay | Admitting: Emergency Medicine

## 2014-08-22 ENCOUNTER — Emergency Department (HOSPITAL_BASED_OUTPATIENT_CLINIC_OR_DEPARTMENT_OTHER)
Admission: EM | Admit: 2014-08-22 | Discharge: 2014-08-22 | Disposition: A | Discharge status: Home / Self Care | Payer: Self-pay | Attending: Emergency Medicine | Admitting: Emergency Medicine

## 2014-08-22 ENCOUNTER — Encounter (HOSPITAL_BASED_OUTPATIENT_CLINIC_OR_DEPARTMENT_OTHER): Payer: Self-pay | Admitting: *Deleted

## 2014-08-22 DIAGNOSIS — Z8742 Personal history of other diseases of the female genital tract: Secondary | ICD-10-CM | POA: Insufficient documentation

## 2014-08-22 DIAGNOSIS — R000 Tachycardia, unspecified: Secondary | ICD-10-CM | POA: Insufficient documentation

## 2014-08-22 DIAGNOSIS — K529 Noninfective gastroenteritis and colitis, unspecified: Secondary | ICD-10-CM | POA: Insufficient documentation

## 2014-08-22 DIAGNOSIS — Z3202 Encounter for pregnancy test, result negative: Secondary | ICD-10-CM | POA: Insufficient documentation

## 2014-08-22 DIAGNOSIS — Z8659 Personal history of other mental and behavioral disorders: Secondary | ICD-10-CM | POA: Insufficient documentation

## 2014-08-22 DIAGNOSIS — Z792 Long term (current) use of antibiotics: Secondary | ICD-10-CM | POA: Insufficient documentation

## 2014-08-22 DIAGNOSIS — Z8619 Personal history of other infectious and parasitic diseases: Secondary | ICD-10-CM | POA: Insufficient documentation

## 2014-08-22 HISTORY — DX: Depression, unspecified: F32.A

## 2014-08-22 HISTORY — DX: Major depressive disorder, single episode, unspecified: F32.9

## 2014-08-22 LAB — URINALYSIS, ROUTINE W REFLEX MICROSCOPIC
Bilirubin Urine: NEGATIVE
GLUCOSE, UA: NEGATIVE mg/dL
KETONES UR: NEGATIVE mg/dL
Leukocytes, UA: NEGATIVE
Nitrite: NEGATIVE
PH: 8.5 — AB (ref 5.0–8.0)
Protein, ur: NEGATIVE mg/dL
SPECIFIC GRAVITY, URINE: 1.021 (ref 1.005–1.030)
Urobilinogen, UA: 1 mg/dL (ref 0.0–1.0)

## 2014-08-22 LAB — URINE MICROSCOPIC-ADD ON

## 2014-08-22 LAB — PREGNANCY, URINE: Preg Test, Ur: NEGATIVE

## 2014-08-22 MED ORDER — ONDANSETRON 8 MG PO TBDP
8.0000 mg | ORAL_TABLET | Freq: Once | ORAL | Status: AC
Start: 2014-08-22 — End: 2014-08-22
  Administered 2014-08-22: 8 mg via ORAL
  Filled 2014-08-22: qty 1

## 2014-08-22 MED ORDER — ONDANSETRON 8 MG PO TBDP: 8.0000 mg | ORAL_TABLET | Freq: Three times a day (TID) | ORAL | Status: DC | PRN

## 2014-08-22 NOTE — ED Notes (Signed)
MD at bedside. 

## 2014-08-22 NOTE — ED Notes (Signed)
Ginger ale provided to pt.

## 2014-08-22 NOTE — ED Notes (Signed)
Pt tolerated PO challenge w/o difficulty. 

## 2014-08-22 NOTE — Discharge Instructions (Signed)
We saw you in the ER for THE NAUSEA, VOMITING, ABDOMINAL PAIN. All the results in the ER are normal, labs and imaging. We are not sure what is causing your symptoms, but think you have gastroenteritis. The workup in the ER is not complete, and is limited to screening for life threatening and emergent conditions only, so please see a primary care doctor for further evaluation.  Please return to the ER if your symptoms worsen; you have increased pain, fevers, chills, inability to keep any medications down, confusion. Otherwise see the outpatient doctor as requested.   Viral Gastroenteritis Viral gastroenteritis is also known as stomach flu. This condition affects the stomach and intestinal tract. It can cause sudden diarrhea and vomiting. The illness typically lasts 3 to 8 days. Most people develop an immune response that eventually gets rid of the virus. While this natural response develops, the virus can make you quite ill. CAUSES  Many different viruses can cause gastroenteritis, such as rotavirus or noroviruses. You can catch one of these viruses by consuming contaminated food or water. You may also catch a virus by sharing utensils or other personal items with an infected person or by touching a contaminated surface. SYMPTOMS  The most common symptoms are diarrhea and vomiting. These problems can cause a severe loss of body fluids (dehydration) and a body salt (electrolyte) imbalance. Other symptoms may include:  Fever.  Headache.  Fatigue.  Abdominal pain. DIAGNOSIS  Your caregiver can usually diagnose viral gastroenteritis based on your symptoms and a physical exam. A stool sample may also be taken to test for the presence of viruses or other infections. TREATMENT  This illness typically goes away on its own. Treatments are aimed at rehydration. The most serious cases of viral gastroenteritis involve vomiting so severely that you are not able to keep fluids down. In these cases, fluids  must be given through an intravenous line (IV). HOME CARE INSTRUCTIONS   Drink enough fluids to keep your urine clear or pale yellow. Drink small amounts of fluids frequently and increase the amounts as tolerated.  Ask your caregiver for specific rehydration instructions.  Avoid:  Foods high in sugar.  Alcohol.  Carbonated drinks.  Tobacco.  Juice.  Caffeine drinks.  Extremely hot or cold fluids.  Fatty, greasy foods.  Too much intake of anything at one time.  Dairy products until 24 to 48 hours after diarrhea stops.  You may consume probiotics. Probiotics are active cultures of beneficial bacteria. They may lessen the amount and number of diarrheal stools in adults. Probiotics can be found in yogurt with active cultures and in supplements.  Wash your hands well to avoid spreading the virus.  Only take over-the-counter or prescription medicines for pain, discomfort, or fever as directed by your caregiver. Do not give aspirin to children. Antidiarrheal medicines are not recommended.  Ask your caregiver if you should continue to take your regular prescribed and over-the-counter medicines.  Keep all follow-up appointments as directed by your caregiver. SEEK IMMEDIATE MEDICAL CARE IF:   You are unable to keep fluids down.  You do not urinate at least once every 6 to 8 hours.  You develop shortness of breath.  You notice blood in your stool or vomit. This may look like coffee grounds.  You have abdominal pain that increases or is concentrated in one small area (localized).  You have persistent vomiting or diarrhea.  You have a fever.  The patient is a child younger than 3 months, and he  or she has a fever.  The patient is a child older than 3 months, and he or she has a fever and persistent symptoms.  The patient is a child older than 3 months, and he or she has a fever and symptoms suddenly get worse.  The patient is a baby, and he or she has no tears when  crying. MAKE SURE YOU:   Understand these instructions.  Will watch your condition.  Will get help right away if you are not doing well or get worse. Document Released: 06/23/2005 Document Revised: 09/15/2011 Document Reviewed: 04/09/2011 [facility] Patient Information 2015 [place], [place]. This information is not intended to replace advice given to you by your health care provider. Make sure you discuss any questions you have with your health care provider.

## 2014-08-22 NOTE — ED Provider Notes (Signed)
CSN: [id number]     Arrival date & time 08/22/14  5638 History   First MD Initiated Contact with Patient 08/22/14 1059     Chief Complaint  Patient presents with  . Emesis     (Consider location/radiation/quality/duration/timing/severity/associated sxs/prior Treatment) HPI Comments: Pt comes in with cc of abd pain, emesis. She has no significant medical hx. Reports that she started having gas like sx after she ate left over chinese food, and overnight started having emesis. Pt has had several episodes of emesis, with some epigastric and upper abd pain. Pt has no diarrhea. Pt denies any uti like sx. Her boyfriend had similar sx 2 days ago. Pt has no uti like sx. She has malaise. She reports breathing worse due to emesis, and her having atelectasis. No cough, chest pains. No dizziness. + subjective fevers.  Patient is a 29 y.o. female presenting with vomiting. The history is provided by the patient.  Emesis Associated symptoms: abdominal pain   Associated symptoms: no headaches     Past Medical History  Diagnosis Date  . Yeast infection   . Anxiety   . BV (bacterial vaginosis)   . Vaginal yeast infection   . Allergy   . Depression    Past Surgical History  Procedure Laterality Date  . Hip surgery    . Wisdom tooth extraction     Family History  Problem Relation Age of Onset  . Diabetes Father   . Cancer Father   . Hypertension Sister    History  Substance Use Topics  . Smoking status: Never Smoker   . Smokeless tobacco: Never Used  . Alcohol Use: Yes     Comment: Occasional   OB History    Gravida Para Term Preterm AB TAB SAB Ectopic Multiple Living   _0     Review of Systems  Constitutional: Positive for fever, activity change and fatigue.  Respiratory: Negative for shortness of breath.   Cardiovascular: Negative for chest pain.  Gastrointestinal: Positive for nausea, vomiting and abdominal pain.  Genitourinary: Negative for dysuria.   Musculoskeletal: Negative for neck pain.  Neurological: Positive for weakness. Negative for headaches.      Allergies  Review of patient's allergies indicates no known allergies.  Home Medications   Prior to Admission medications   Medication Sig Start Date End Date Taking? Authorizing Provider  Ibuprofen (ADVIL PO) Take by mouth.   Yes Historical Provider, MD  chlorpheniramine-pseudoephedrine-acetaminophen (SINUTAB) 2-30-500 MG per tablet Take 1 tablet by mouth every 4 (four) hours as needed. For allergies.    Historical Provider, MD  ciprofloxacin (CIPRO) 500 MG tablet Take 500 mg by mouth 2 (two) times daily. 3 day course of therapy; started 09/12/11    Historical Provider, MD  CLINDAMYCIN HCL PO Take by mouth.    Historical Provider, MD  HYDROcodone-acetaminophen (NORCO) 5-325 MG per tablet Take 1 tablet by mouth every 6 (six) hours as needed. For pain.    Historical Provider, MD  HYDROcodone-acetaminophen (NORCO/VICODIN) 5-325 MG per tablet Take 1 tablet by mouth every 6 (six) hours as needed for moderate pain. 01/16/14   Resa Miner Lawyer, PA-C  metroNIDAZOLE (FLAGYL) 500 MG tablet Take 1 tablet (500 mg total) by mouth 2 (two) times daily. 01/23/14   Shaune Pollack, MD  metroNIDAZOLE (METROGEL VAGINAL) 0.75 % vaginal gel Place 1 Applicatorful vaginally 2 (two) times daily. 08/15/12   Mylinda Latina, MD  miconazole (MONISTAT 1 COMBINATION PACK) kit  Place 1 each vaginally once.    Historical Provider, MD  miconazole (MONISTAT-3) KIT Place 1 each vaginally at bedtime.    Historical Provider, MD  ondansetron (ZOFRAN ODT) 8 MG disintegrating tablet Take 1 tablet (8 mg total) by mouth every 8 (eight) hours as needed for nausea. 08/22/14   Varney Biles, MD  Oxycodone-Acetaminophen (PERCOCET PO) Take by mouth.    Historical Provider, MD  penicillin v potassium (VEETID) 500 MG tablet Take 500 mg by mouth 4 (four) times daily.    Historical Provider, MD   BP 107/59 mmHg  Pulse 118  Temp(Src)  100.1 F (37.8 C) (Oral)  Resp 18  Ht _0  (1.651 m)  Wt 156 lb (70.761 kg)  BMI 25.96 kg/m2  SpO2 98%  LMP 07/07/2014 Physical Exam  Constitutional: She is oriented to person, place, and time. She appears well-developed and well-nourished.  HENT:  Head: Normocephalic and atraumatic.  Mucus membrane moist  Eyes: EOM are normal. Pupils are equal, round, and reactive to light.  Neck: Neck supple.  Cardiovascular: Regular rhythm and normal heart sounds.   tachycardia  Pulmonary/Chest: Effort normal. No respiratory distress.  Abdominal: Soft. She exhibits no distension. There is tenderness. There is no rebound and no guarding.  Neurological: She is alert and oriented to person, place, and time.  Skin: Skin is warm and dry.  Nursing note and vitals reviewed.   ED Course  Procedures (including critical care time) Labs Review Labs Reviewed  URINALYSIS, ROUTINE W REFLEX MICROSCOPIC - Abnormal; Notable for the following:    APPearance CLOUDY (*)    pH 8.5 (*)    Hgb urine dipstick TRACE (*)    All other components within normal limits  URINE MICROSCOPIC-ADD ON - Abnormal; Notable for the following:    Squamous Epithelial / LPF FEW (*)    Bacteria, UA MANY (*)    All other components within normal limits  PREGNANCY, URINE    Imaging Review No results found.   EKG Interpretation   Date/Time:  Tuesday August 22 2014 09:35:18 EST Ventricular Rate:  104 PR Interval:  132 QRS Duration: 82 QT Interval:  326 QTC Calculation: 428 R Axis:   61 Text Interpretation:  Sinus tachycardia Nonspecific T wave abnormality  Abnormal ECG No old tracing to compare Confirmed by Konica Stankowski, MD, Thelma Comp  [phone]) on 08/22/2014 12:59:11 PM      _1 :30 : Pt's UA shows no ketones, an no sig high specific gravity. Oral hydration started.  _2 :30 : Oral challenge passed. Pt tachycardic - but she is tolerating po so no iv fluids for now. Asked to start with clear liq diet and slowly advance to  normal.  MDM   Final diagnoses:  Gastroenteritis    Pt comes in with cc of emesis, abd pain and gas. + subjective fevers, and malaise and myalgias. She has mild epigastric tenderness. She has no focal RUQ tenderness, neg Murphy;s. Symptoms consistent with, what appears most likely to be a viral infection with the constellation of symptoms that patient has. Will reassess, and start with UA and fluid challenge.   Varney Biles, MD 08/22/14 1455

## 2014-08-22 NOTE — ED Notes (Signed)
Patient states she developed vomiting and abdominal gas last night around 8pm.  Has continued all night with vomiting every 20 minutes.  States [facility] and has breathing problems which are worse since vomiting all night.  States her boyfriend had vomiting several days ago as well.  Feels like she has food poisoning.

## 2014-09-04 NOTE — ED Notes (Signed)
Patient called to get a referral to a Primary Care Physician.   [facility]

## 2014-10-11 ENCOUNTER — Ambulatory Visit: Payer: Self-pay

## 2014-11-17 ENCOUNTER — Telehealth: Payer: Self-pay

## 2014-11-17 NOTE — Telephone Encounter (Signed)
PATIENT COMING BY ON MONDAY TO PICK UP MEDICAL RECORDS - [facility]

## 2014-11-29 ENCOUNTER — Encounter (HOSPITAL_COMMUNITY): Payer: Self-pay | Admitting: *Deleted

## 2014-11-29 ENCOUNTER — Inpatient Hospital Stay (HOSPITAL_COMMUNITY)
Admission: AD | Admit: 2014-11-29 | Discharge: 2014-11-29 | Disposition: A | Discharge status: Home / Self Care | Payer: Self-pay | Source: Ambulatory Visit | Attending: Obstetrics & Gynecology | Admitting: Obstetrics & Gynecology

## 2014-11-29 DIAGNOSIS — Z886 Allergy status to analgesic agent status: Secondary | ICD-10-CM | POA: Insufficient documentation

## 2014-11-29 DIAGNOSIS — N611 Abscess of the breast and nipple: Secondary | ICD-10-CM

## 2014-11-29 DIAGNOSIS — N61 Inflammatory disorders of breast: Secondary | ICD-10-CM | POA: Insufficient documentation

## 2014-11-29 HISTORY — DX: Periapical abscess without sinus: K04.7

## 2014-11-29 HISTORY — DX: Other specified disorders of eye and adnexa: H57.89

## 2014-11-29 HISTORY — DX: Unspecified lump in unspecified breast: N63.0

## 2014-11-29 HISTORY — DX: Bronchitis, not specified as acute or chronic: J40

## 2014-11-29 HISTORY — DX: Other seasonal allergic rhinitis: J30.2

## 2014-11-29 HISTORY — DX: Headache, unspecified: R51.9

## 2014-11-29 HISTORY — DX: Headache: R51

## 2014-11-29 LAB — BASIC METABOLIC PANEL
Anion gap: 5 (ref 5–15)
BUN: 8 mg/dL (ref 6–20)
CO2: 24 mmol/L (ref 22–32)
Calcium: 8.7 mg/dL — ABNORMAL LOW (ref 8.9–10.3)
Chloride: 106 mmol/L (ref 101–111)
Creatinine, Ser: 0.93 mg/dL (ref 0.44–1.00)
GFR calc Af Amer: >60 mL/min (ref >60)
GFR calc non Af Amer: >60 mL/min (ref >60)
GLUCOSE: 98 mg/dL (ref 65–99)
Potassium: 3.9 mmol/L (ref 3.5–5.1)
SODIUM: 135 mmol/L (ref 135–145)

## 2014-11-29 LAB — URINE MICROSCOPIC-ADD ON

## 2014-11-29 LAB — CBC WITH DIFFERENTIAL/PLATELET
BASOS PCT: 0 % (ref 0–1)
Basophils Absolute: 0 10*3/uL (ref 0.0–0.1)
EOS PCT: 1 % (ref 0–5)
Eosinophils Absolute: 0.1 10*3/uL (ref 0.0–0.7)
HCT: 32.3 % — ABNORMAL LOW (ref 36.0–46.0)
Hemoglobin: 10.9 g/dL — ABNORMAL LOW (ref 12.0–15.0)
LYMPHS ABS: 2 10*3/uL (ref 0.7–4.0)
Lymphocytes Relative: 31 % (ref 12–46)
MCH: 27.2 pg (ref 26.0–34.0)
MCHC: 33.7 g/dL (ref 30.0–36.0)
MCV: 80.5 fL (ref 78.0–100.0)
MONO ABS: 0.6 10*3/uL (ref 0.1–1.0)
Monocytes Relative: 9 % (ref 3–12)
Neutro Abs: 3.8 10*3/uL (ref 1.7–7.7)
Neutrophils Relative %: 59 % (ref 43–77)
PLATELETS: 253 10*3/uL (ref 150–400)
RBC: 4.01 MIL/uL (ref 3.87–5.11)
RDW: 13.6 % (ref 11.5–15.5)
WBC: 6.4 10*3/uL (ref 4.0–10.5)

## 2014-11-29 LAB — WET PREP, GENITAL
Clue Cells Wet Prep HPF POC: NONE SEEN
Trich, Wet Prep: NONE SEEN
YEAST WET PREP: NONE SEEN

## 2014-11-29 LAB — URINALYSIS, ROUTINE W REFLEX MICROSCOPIC
Bilirubin Urine: NEGATIVE
GLUCOSE, UA: 250 mg/dL — AB
KETONES UR: NEGATIVE mg/dL
Leukocytes, UA: NEGATIVE
Nitrite: NEGATIVE
PROTEIN: NEGATIVE mg/dL
Specific Gravity, Urine: >1.03 — ABNORMAL HIGH (ref 1.005–1.030)
UROBILINOGEN UA: 0.2 mg/dL (ref 0.0–1.0)
pH: 6 (ref 5.0–8.0)

## 2014-11-29 LAB — POCT PREGNANCY, URINE: Preg Test, Ur: NEGATIVE

## 2014-11-29 MED ORDER — SULFAMETHOXAZOLE-TRIMETHOPRIM 800-160 MG PO TABS: 1.0000 | ORAL_TABLET | Freq: Two times a day (BID) | ORAL | Status: AC

## 2014-11-29 MED ORDER — IBUPROFEN 600 MG PO TABS
600.0000 mg | ORAL_TABLET | Freq: Four times a day (QID) | ORAL | Status: DC | PRN
Start: 2014-11-29 — End: 2018-11-25

## 2014-11-29 MED ORDER — HYDROCODONE-ACETAMINOPHEN 5-325 MG PO TABS
2.0000 | ORAL_TABLET | Freq: Once | ORAL | Status: AC
  Administered 2014-11-29: 2 via ORAL
  Filled 2014-11-29: qty 2

## 2014-11-29 MED ORDER — HYDROCODONE-ACETAMINOPHEN 5-325 MG PO TABS: 1.0000 | ORAL_TABLET | ORAL | Status: DC | PRN

## 2014-11-29 NOTE — MAU Provider Note (Signed)
History     CSN: [id number]  Arrival date and time: 11/29/14 1230   First Provider Initiated Contact with Patient 11/29/14 1422      Chief Complaint  Patient presents with  . Breast Mass   HPI   [name] is a 29 y.o. female [place] who presents with a painful area on her left breast. She first noticed the bump one year ago; it was very small then;  urgent care told her that the bump was normal. A few days ago the area grew bigger, and has become very painful. She is having a difficult time wearing a bra and sleeping on the left side.  She currently rates her pain 10/10 when it is touched. She has not tried anything over the counter for the pain.   OB History    Gravida Para Term Preterm AB TAB SAB Ectopic Multiple Living   _0      Past Medical History  Diagnosis Date  . Yeast infection   . Anxiety   . BV (bacterial vaginosis)   . Vaginal yeast infection   . Allergy   . Depression   . Breast mass   . Headache   . Tooth abscess   . Bronchitis   . Seasonal allergies   . Eye irritation     Past Surgical History  Procedure Laterality Date  . Hip surgery    . Wisdom tooth extraction      Family History  Problem Relation Age of Onset  . Diabetes Father   . Cancer Father   . Hypertension Sister     History  Substance Use Topics  . Smoking status: Never Smoker   . Smokeless tobacco: Never Used  . Alcohol Use: Yes     Comment: Occasional    Allergies: No Known Allergies  Prescriptions prior to admission  Medication Sig Dispense Refill Last Dose  . HYDROcodone-acetaminophen (NORCO/VICODIN) 5-325 MG per tablet Take 1 tablet by mouth every 6 (six) hours as needed for moderate pain. 15 tablet 0 Past Week at Unknown time  . ibuprofen (ADVIL,MOTRIN) 800 MG tablet Take 800 mg by mouth every 8 (eight) hours as needed for mild pain.   Past Week at Unknown time  . metroNIDAZOLE (FLAGYL) 500 MG tablet Take 1 tablet (500 mg total) by mouth 2  (two) times daily. (Patient not taking: Reported on 11/29/2014) 14 tablet 0   . metroNIDAZOLE (METROGEL VAGINAL) 0.75 % vaginal gel Place 1 Applicatorful vaginally 2 (two) times daily. (Patient not taking: Reported on 11/29/2014) 70 g 0   . ondansetron (ZOFRAN ODT) 8 MG disintegrating tablet Take 1 tablet (8 mg total) by mouth every 8 (eight) hours as needed for nausea. (Patient not taking: Reported on 11/29/2014) 20 tablet 0    Results for orders placed or performed during the hospital encounter of 11/29/14 (from the past 48 hour(s))  Urinalysis, Routine w reflex microscopic     Status: Abnormal   Collection Time: 11/29/14 12:37 PM  Result Value Ref Range   Color, Urine YELLOW YELLOW   APPearance CLOUDY (A) CLEAR   Specific Gravity, Urine >1.030 (H) 1.005 - 1.030   pH 6.0 5.0 - 8.0   Glucose, UA 250 (A) NEGATIVE mg/dL   Hgb urine dipstick MODERATE (A) NEGATIVE   Bilirubin Urine NEGATIVE NEGATIVE   Ketones, ur NEGATIVE NEGATIVE mg/dL   Protein, ur NEGATIVE NEGATIVE mg/dL   Urobilinogen, UA 0.2 0.0 - 1.0  mg/dL   Nitrite NEGATIVE NEGATIVE   Leukocytes, UA NEGATIVE NEGATIVE  Urine microscopic-add on     Status: Abnormal   Collection Time: 11/29/14 12:37 PM  Result Value Ref Range   Squamous Epithelial / LPF MANY (A) RARE   WBC, UA 0-2 <3 WBC/hpf   RBC / HPF 0-2 <3 RBC/hpf   Bacteria, UA FEW (A) RARE   Urine-Other MUCOUS PRESENT   Pregnancy, urine POC     Status: None   Collection Time: 11/29/14 12:56 PM  Result Value Ref Range   Preg Test, Ur NEGATIVE NEGATIVE    Comment:        THE SENSITIVITY OF THIS METHODOLOGY IS >24 mIU/mL   Wet prep, genital     Status: Abnormal   Collection Time: 11/29/14  2:33 PM  Result Value Ref Range   Yeast Wet Prep HPF POC NONE SEEN NONE SEEN   Trich, Wet Prep NONE SEEN NONE SEEN   Clue Cells Wet Prep HPF POC NONE SEEN NONE SEEN   WBC, Wet Prep HPF POC FEW (A) NONE SEEN    Comment: MODERATE BACTERIA SEEN  CBC with Differential     Status: Abnormal     Collection Time: 11/29/14  4:05 PM  Result Value Ref Range   WBC 6.4 4.0 - 10.5 K/uL   RBC 4.01 3.87 - 5.11 MIL/uL   Hemoglobin 10.9 (L) 12.0 - 15.0 g/dL   HCT 32.3 (L) 36.0 - 46.0 %   MCV 80.5 78.0 - 100.0 fL   MCH 27.2 26.0 - 34.0 pg   MCHC 33.7 30.0 - 36.0 g/dL   RDW 13.6 11.5 - 15.5 %   Platelets 253 150 - 400 K/uL   Neutrophils Relative % 59 43 - 77 %   Neutro Abs 3.8 1.7 - 7.7 K/uL   Lymphocytes Relative 31 12 - 46 %   Lymphs Abs 2.0 0.7 - 4.0 K/uL   Monocytes Relative 9 3 - 12 %   Monocytes Absolute 0.6 0.1 - 1.0 K/uL   Eosinophils Relative 1 0 - 5 %   Eosinophils Absolute 0.1 0.0 - 0.7 K/uL   Basophils Relative 0 0 - 1 %   Basophils Absolute 0.0 0.0 - 0.1 K/uL  Basic metabolic panel     Status: Abnormal   Collection Time: 11/29/14  4:05 PM  Result Value Ref Range   Sodium 135 135 - 145 mmol/L   Potassium 3.9 3.5 - 5.1 mmol/L   Chloride 106 101 - 111 mmol/L   CO2 24 22 - 32 mmol/L   Glucose, Bld 98 65 - 99 mg/dL   BUN 8 6 - 20 mg/dL   Creatinine, Ser 0.93 0.44 - 1.00 mg/dL   Calcium 8.7 (L) 8.9 - 10.3 mg/dL   GFR calc non Af Amer >60 >60 mL/min   GFR calc Af Amer >60 >60 mL/min    Comment: (NOTE) The eGFR has been calculated using the CKD EPI equation. This calculation has not been validated in all clinical situations. eGFR's persistently <60 mL/min signify possible Chronic Kidney Disease.    Anion gap 5 5 - 15    Review of Systems  Constitutional: Negative for fever and chills.  Genitourinary:       Foul smelling vaginal discharge; white.   Skin:       Left breast bump    Physical Exam   Blood pressure 121/76, pulse 91, temperature 98 F (36.7 C), temperature source Oral, resp. rate 18, height _0  (1.626  m), weight 81.103 kg (178 lb 12.8 oz).  Physical Exam  Constitutional: She is oriented to person, place, and time. She appears well-developed and well-nourished. No distress.  Respiratory: Effort normal. Left breast exhibits mass, skin change and  tenderness. Left breast exhibits no inverted nipple.    Genitourinary:  Wet prep and GC collected by RN   Musculoskeletal: Normal range of motion.  Neurological: She is alert and oriented to person, place, and time.  Skin: Skin is warm. She is not diaphoretic.  Psychiatric: Her speech is normal and behavior is normal. Her mood appears anxious.    MAU Course  Procedures  None   MDM  Vicodin 2 tabs given in MAU  Dr.Leggett at the beside to examine breast abscess.  General surgery consulted   PA from general surgery came to MAU and aspirated the abscess> wound culture sent.  Assessment and Plan   A:  1. Left breast abscess     P:  Discharge home in stable condition RX: bactrim X 14 days Call Dr. Fanny Skates in 2 weeks for follow up or call sooner if symptoms worsen Ok to shower Warm soaks to the area as often as tolerated.  A list of PCP providers in [place] given to the patient.    Lezlie Lye, NP 11/29/2014 2:27 PM

## 2014-11-29 NOTE — MAU Note (Signed)
C/o breast mass that has gotten bigger over the past 2 days;

## 2014-11-29 NOTE — Discharge Instructions (Signed)

## 2014-11-29 NOTE — Consult Note (Signed)
Reason for Consult:  Left breast abscess Referring Physician: Judye Bos NP  Brenda West is an 29 y.o. female.  HPI: We are ask to see this 29 y/o who has several complaints.  She has an area on her left lateral breast about where the strap would come around.  She says she had something there about 1 year ago, Urgent Care exam reported it was normal. No real issues till about 1 week ago when it started getting bigger and tender.  Now she cannot wear a bra without significant discomfort.  Work up show 2.5 cm area of induration, very little erythema, but a little indurated.  The mid portion is cystic and you can feel fluid in it.  It is very tender to palpation.  Her Father was in the room with her for the entire exam and aspiration.    Labs show UA is cloudy, with normal CBC.  WBC 6.4, BMP was normal.  We are ask to see.  UA/wet prep showed some WBC.  Past Medical History  Diagnosis Date  . Yeast infection   . Anxiety   . BV (bacterial vaginosis)   . Vaginal yeast infection   . Allergy   . Depression   . Breast mass   . Headache   . Tooth abscess   . Bronchitis   . Seasonal allergies   . Eye irritation     Past Surgical History  Procedure Laterality Date  . Hip surgery    . Wisdom tooth extraction      Family History  Problem Relation Age of Onset  . Diabetes Father   . Cancer Father   . Hypertension Sister     Social History:  reports that she has never smoked. She has never used smokeless tobacco. She reports that she drinks alcohol. She reports that she does not use illicit drugs.  Allergies: No Known Allergies  Medications:  Prior to Admission:  Prescriptions prior to admission  Medication Sig Dispense Refill Last Dose  . HYDROcodone-acetaminophen (NORCO/VICODIN) 5-325 MG per tablet Take 1 tablet by mouth every 6 (six) hours as needed for moderate pain. 15 tablet 0 Past Week at Unknown time  . ibuprofen (ADVIL,MOTRIN) 800 MG tablet Take 800 mg by mouth every  8 (eight) hours as needed for mild pain.   Past Week at Unknown time  . metroNIDAZOLE (FLAGYL) 500 MG tablet Take 1 tablet (500 mg total) by mouth 2 (two) times daily. (Patient not taking: Reported on 11/29/2014) 14 tablet 0   . metroNIDAZOLE (METROGEL VAGINAL) 0.75 % vaginal gel Place 1 Applicatorful vaginally 2 (two) times daily. (Patient not taking: Reported on 11/29/2014) 70 g 0   . ondansetron (ZOFRAN ODT) 8 MG disintegrating tablet Take 1 tablet (8 mg total) by mouth every 8 (eight) hours as needed for nausea. (Patient not taking: Reported on 11/29/2014) 20 tablet 0    Scheduled: Continuous: PRN: Anti-infectives    Start     Dose/Rate Route Frequency Ordered Stop   11/29/14 0000  sulfamethoxazole-trimethoprim (BACTRIM DS,SEPTRA DS) 800-160 MG per tablet     1 tablet Oral 2 times daily 11/29/14 1658 12/06/14 2359      Results for orders placed or performed during the hospital encounter of 11/29/14 (from the past 48 hour(s))  Urinalysis, Routine w reflex microscopic     Status: Abnormal   Collection Time: 11/29/14 12:37 PM  Result Value Ref Range   Color, Urine YELLOW YELLOW   APPearance CLOUDY (A) CLEAR  Specific Gravity, Urine >1.030 (H) 1.005 - 1.030   pH 6.0 5.0 - 8.0   Glucose, UA 250 (A) NEGATIVE mg/dL   Hgb urine dipstick MODERATE (A) NEGATIVE   Bilirubin Urine NEGATIVE NEGATIVE   Ketones, ur NEGATIVE NEGATIVE mg/dL   Protein, ur NEGATIVE NEGATIVE mg/dL   Urobilinogen, UA 0.2 0.0 - 1.0 mg/dL   Nitrite NEGATIVE NEGATIVE   Leukocytes, UA NEGATIVE NEGATIVE  Urine microscopic-add on     Status: Abnormal   Collection Time: 11/29/14 12:37 PM  Result Value Ref Range   Squamous Epithelial / LPF MANY (A) RARE   WBC, UA 0-2 <3 WBC/hpf   RBC / HPF 0-2 <3 RBC/hpf   Bacteria, UA FEW (A) RARE   Urine-Other MUCOUS PRESENT   Pregnancy, urine POC     Status: None   Collection Time: 11/29/14 12:56 PM  Result Value Ref Range   Preg Test, Ur NEGATIVE NEGATIVE    Comment:        THE  SENSITIVITY OF THIS METHODOLOGY IS >24 mIU/mL   Wet prep, genital     Status: Abnormal   Collection Time: 11/29/14  2:33 PM  Result Value Ref Range   Yeast Wet Prep HPF POC NONE SEEN NONE SEEN   Trich, Wet Prep NONE SEEN NONE SEEN   Clue Cells Wet Prep HPF POC NONE SEEN NONE SEEN   WBC, Wet Prep HPF POC FEW (A) NONE SEEN    Comment: MODERATE BACTERIA SEEN  CBC with Differential     Status: Abnormal   Collection Time: 11/29/14  4:05 PM  Result Value Ref Range   WBC 6.4 4.0 - 10.5 K/uL   RBC 4.01 3.87 - 5.11 MIL/uL   Hemoglobin 10.9 (L) 12.0 - 15.0 g/dL   HCT 32.3 (L) 36.0 - 46.0 %   MCV 80.5 78.0 - 100.0 fL   MCH 27.2 26.0 - 34.0 pg   MCHC 33.7 30.0 - 36.0 g/dL   RDW 13.6 11.5 - 15.5 %   Platelets 253 150 - 400 K/uL   Neutrophils Relative % 59 43 - 77 %   Neutro Abs 3.8 1.7 - 7.7 K/uL   Lymphocytes Relative 31 12 - 46 %   Lymphs Abs 2.0 0.7 - 4.0 K/uL   Monocytes Relative 9 3 - 12 %   Monocytes Absolute 0.6 0.1 - 1.0 K/uL   Eosinophils Relative 1 0 - 5 %   Eosinophils Absolute 0.1 0.0 - 0.7 K/uL   Basophils Relative 0 0 - 1 %   Basophils Absolute 0.0 0.0 - 0.1 K/uL  Basic metabolic panel     Status: Abnormal   Collection Time: 11/29/14  4:05 PM  Result Value Ref Range   Sodium 135 135 - 145 mmol/L   Potassium 3.9 3.5 - 5.1 mmol/L   Chloride 106 101 - 111 mmol/L   CO2 24 22 - 32 mmol/L   Glucose, Bld 98 65 - 99 mg/dL   BUN 8 6 - 20 mg/dL   Creatinine, Ser 0.93 0.44 - 1.00 mg/dL   Calcium 8.7 (L) 8.9 - 10.3 mg/dL   GFR calc non Af Amer >60 >60 mL/min   GFR calc Af Amer >60 >60 mL/min    Comment: (NOTE) The eGFR has been calculated using the CKD EPI equation. This calculation has not been validated in all clinical situations. eGFR's persistently <60 mL/min signify possible Chronic Kidney Disease.    Anion gap 5 5 - 15    No results found.  Review of Systems  HENT:       She also has a tooth abscess  Eyes: Negative.   Respiratory: Negative.   Cardiovascular:  Negative.   Gastrointestinal: Positive for nausea (once). Negative for heartburn, vomiting, abdominal pain, diarrhea, constipation, blood in stool and melena.  Genitourinary:       Complains of odor  Musculoskeletal:       Leg pain after pin insertion some years ago  Skin: Negative.   Neurological: Positive for weakness and headaches (frequent).  Endo/Heme/Allergies: Negative.   Psychiatric/Behavioral: Positive for depression. The patient is nervous/anxious.    Blood pressure 121/76, pulse 91, temperature 98 F (36.7 C), temperature source Oral, resp. rate 18, height 5' 4" (1.626 m), weight 81.103 kg (178 lb 12.8 oz). Physical Exam  Constitutional: She is oriented to person, place, and time. She appears well-developed and well-nourished. No distress.  Very anxious  HENT:  Head: Normocephalic and atraumatic.  Nose: Nose normal.  Eyes: Conjunctivae are normal. Pupils are equal, round, and reactive to light. Right eye exhibits no discharge. Left eye exhibits no discharge. No scleral icterus.  Neck: Normal range of motion. Neck supple. No JVD present. No tracheal deviation present. No thyromegaly present.  Cardiovascular: Normal rate, regular rhythm and intact distal pulses.   Murmur heard. Respiratory: Effort normal and breath sounds normal. No respiratory distress. She has no wheezes. She has no rales. She exhibits no tenderness. Left breast exhibits skin change and tenderness.    GI: Soft. Bowel sounds are normal. She exhibits no distension and no mass. There is no tenderness. There is no rebound and no guarding.  Musculoskeletal: She exhibits no edema or tenderness.  Lymphadenopathy:    She has no cervical adenopathy.  Neurological: She is alert and oriented to person, place, and time. No cranial nerve deficit.  Skin: Skin is warm and dry. No rash noted. There is erythema. No pallor.  Psychiatric: She has a normal mood and affect. Her behavior is normal. Judgment and thought content  normal.  Very anxious, remembers each illness regardless of time frame.      Assessment/Plan: 1.  Left breast abscess, early with minimal erythema 2.  Tooth abscess 3.  Anxiety and depression  Plan:  I discussed with Dr. Dalbert Batman, and forwarded him the picture in EPIC.  I performed an aspiration of the site using 1% lidocaine with epi for anesthesia.  About 2 cc of bloody purulent fluid was aspirated and sent for culture.  We recommended 2 weeks of Septra to be started this evening, warm compress soaks, and follow up with Dr. Dalbert Batman in the 2 weeks.  She is to call if the swelling get worse, or if she fever, or increasing erythema.  JENNINGS,WILLARD 11/29/2014, 4:59 PM

## 2014-11-29 NOTE — MAU Note (Signed)
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

## 2014-11-29 NOTE — Progress Notes (Signed)
Procedure note:  Pre and post diagnosis:  Early left breast abscess Procdure:  Aspiration of left breast abscess Pt presents with 1 week of swelling and new tenderness left lateral breast.  WBC normal, no temp, minimal erythema.  2.5 cm site left lateral breast along the about 2-3 o'clock position.    Procedure:  Breast was prepped with betadine swabs.  (3) Area was infiltrated with 1% lidocaine and epinephrine till anesthesia was obtained.  25 gauge needle, 2.5 ml of lidocaine used. Site was aspirated using an 18 g needle.  About 2 - 2.5 ml of bloody purulent looking fluid obtained.  I did multiple passes to ensure I drain as much as possible.  She had no discomfort after the initial lidocaine injection.  Fluid was sent to lab for culture.  Site was cleaned a second time after the procedure, and Bandaide applied. She tolerated the procedure well.  Will Eli Lilly and CompanyJennings PA-C

## 2014-11-29 NOTE — MAU Note (Signed)
Pt presents complaining of a breast lump on the R side. States it is getting larger and more painful. Pt requesting a referral for a pap smear. States she is also having increased anxiety and sees a therapist regularly. Denies vaginal bleeding or discharge.

## 2014-11-30 ENCOUNTER — Ambulatory Visit (HOSPITAL_COMMUNITY)
Admission: RE | Admit: 2014-11-30 | Discharge: 2014-11-30 | Disposition: A | Discharge status: Home / Self Care | Payer: Self-pay | Attending: Psychiatry | Admitting: Psychiatry

## 2014-11-30 LAB — GC/CHLAMYDIA PROBE AMP (~~LOC~~) NOT AT ARMC
Chlamydia: NEGATIVE
Neisseria Gonorrhea: NEGATIVE
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
## 2014-12-03 LAB — CULTURE, ROUTINE-ABSCESS: SPECIAL REQUESTS: NORMAL

## 2015-02-12 ENCOUNTER — Telehealth: Payer: Self-pay

## 2015-02-12 NOTE — Telephone Encounter (Signed)
Patient picked up medical records from [place] back in May 2016 - all came from [facility] system - she wanted to know if we could get her consent forms she signed for treatment in [id number] - told her we would check and call her back

## 2015-03-19 NOTE — Telephone Encounter (Signed)
Patient left phone number [phone]

## 2015-03-19 NOTE — Telephone Encounter (Signed)
Attempted to contact the patient regarding consent forms and left message for patient to call the office.

## 2015-05-22 NOTE — Telephone Encounter (Signed)
Patient has not returned call to the office. Call to be re-filed. 
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
## 2016-04-07 ENCOUNTER — Emergency Department (HOSPITAL_COMMUNITY): Payer: Self-pay

## 2016-04-07 ENCOUNTER — Emergency Department (HOSPITAL_COMMUNITY)
Admission: EM | Admit: 2016-04-07 | Discharge: 2016-04-07 | Disposition: A | Discharge status: Home / Self Care | Payer: Self-pay | Source: Home / Self Care | Attending: Emergency Medicine | Admitting: Emergency Medicine

## 2016-04-07 DIAGNOSIS — R059 Cough, unspecified: Secondary | ICD-10-CM

## 2016-04-07 DIAGNOSIS — R05 Cough: Secondary | ICD-10-CM

## 2016-04-07 MED ORDER — CETIRIZINE HCL 10 MG PO CAPS: 10.0000 mg | ORAL_CAPSULE | Freq: Every day | ORAL | 0 refills | Status: DC

## 2016-04-07 MED ORDER — PREDNISONE 10 MG PO TABS: 20.0000 mg | ORAL_TABLET | Freq: Every day | ORAL | 0 refills | Status: DC

## 2016-04-07 NOTE — ED Triage Notes (Signed)
Pt. reports persistent dry cough with nasal congestion and runny nose for several weeks unrelieved by OTC medications , denies fever or chills.

## 2016-04-07 NOTE — ED Notes (Signed)
Patient is A&Ox4 at this time.  Patient in no signs of distress.  Please see providers note for complete history and physical exam.  

## 2016-04-07 NOTE — ED Provider Notes (Signed)
[facility]-EMERGENCY DEPT Provider Note   CSN: [id number] Arrival date & time: 04/07/16  0009 By signing my name below, I, Levon Hedger, attest that this documentation has been prepared under the direction and in the presence of No att. providers found . Electronically Signed: Levon Hedger, Scribe. 04/07/2016. 1:41 AM.   History   Chief Complaint Chief Complaint  Patient presents with  . Cough  . Nasal Congestion    HPI Brenda West is a 30 y.o. female who presents to the Emergency Department complaining of persistent dry cough onset two months ago. She has taken zyrtec and tessalon with no relief. She states she had a similar cough last year for which she was seen at [facility]; per pt, she had an x-ray with "a grey spot".  Pt was then prescribed codeine syrup with relief. She notes associated rhinorrhea, congestion, bilateral feet swelling, sore throat, and SOB. Pt is an occasional smoker with no hx of blood clots. She denies fever.   The history is provided by the patient. No language interpreter was used.   History reviewed. No pertinent past medical history.  There are no active problems to display for this patient.   History reviewed. No pertinent surgical history.  OB History    No data available       Home Medications    Prior to Admission medications   Medication Sig Start Date End Date Taking? Authorizing Provider  Cetirizine HCl 10 MG CAPS Take 1 capsule (10 mg total) by mouth daily. 04/07/16   Tilden Fossa, MD  predniSONE (DELTASONE) 10 MG tablet Take 2 tablets (20 mg total) by mouth daily. 04/07/16   Tilden Fossa, MD    Family History No family history on file.  Social History Social History  Substance Use Topics  . Smoking status: Never Smoker  . Smokeless tobacco: Never Used  . Alcohol use Yes     Allergies   Review of patient's allergies indicates no known allergies.  Review of Systems Review of Systems 10 systems reviewed and all are negative  for acute change except as noted in the HPI.   Physical Exam Updated Vital Signs BP 128/86   Pulse 92   Temp 98.4 F (36.9 C) (Oral)   Resp 18   Ht 5\' 3"  (1.6 m)   Wt 175 lb (79.4 kg)   LMP 03/07/2016 (Approximate)   SpO2 100%   BMI 31.00 kg/m   Physical Exam  Constitutional: She is oriented to person, place, and time. She appears well-developed and well-nourished.  HENT:  Head: Normocephalic and atraumatic.  Right Ear: Tympanic membrane, external ear and ear canal normal.  Left Ear: Tympanic membrane, external ear and ear canal normal.  Cardiovascular: Normal rate and regular rhythm.   No murmur heard. Pulmonary/Chest: Effort normal and breath sounds normal. No respiratory distress. She has no wheezes. She has no rales.  Abdominal: Soft. There is no tenderness. There is no rebound and no guarding.  Musculoskeletal: She exhibits no edema or tenderness.  Neurological: She is alert and oriented to person, place, and time.  Skin: Skin is warm and dry.  Psychiatric: She has a normal mood and affect. Her behavior is normal.  Nursing note and vitals reviewed.  ED Treatments / Results  DIAGNOSTIC STUDIES:  Oxygen Saturation is 98% on RA, normal by my interpretation.    COORDINATION OF CARE:  1:40 AM Will order DG chest. Discussed treatment plan with pt at bedside and pt agreed to plan. Labs (all labs ordered  are listed, but only abnormal results are displayed) Labs Reviewed - No data to display  EKG  EKG Interpretation None      Radiology Dg Chest 2 View  Result Date: 04/07/2016 CLINICAL DATA:  Cough for 2 months.  Sinus congestion.  Smoker. EXAM: CHEST  2 VIEW COMPARISON:  None. FINDINGS: The heart size and mediastinal contours are within normal limits. Both lungs are clear. The visualized skeletal structures are unremarkable. IMPRESSION: No active cardiopulmonary disease. Electronically Signed   By: Burman NievesWilliam  Stevens M.D.   On: 04/07/2016 02:11     Procedures Procedures (including critical care time)  Medications Ordered in ED Medications - No data to display   Initial Impression / Assessment and Plan / ED Course  I have reviewed the triage vital signs and the nursing notes.  Pertinent labs & imaging results that were available during my care of the patient were reviewed by me and considered in my medical decision making (see chart for details).  Clinical Course    Patient here for evaluation of 2 months of cough. Lungs are clear on examination with no respiratory distress. No evidence of acute pneumonia. Presentation is not consistent with CHF, PE. Favor allergic cough. Discussed continuing the Zyrtec at home. Will provide short course of steroids to see if she has some symptomatic improvement.  Discussed outpatient follow-up and return precautions.  Final Clinical Impressions(s) / ED Diagnoses   Final diagnoses:  Cough    New Prescriptions Discharge Medication List as of 04/07/2016  2:34 AM    START taking these medications   Details  Cetirizine HCl 10 MG CAPS Take 1 capsule (10 mg total) by mouth daily., Starting Mon 04/07/2016, Print    predniSONE (DELTASONE) 10 MG tablet Take 2 tablets (20 mg total) by mouth daily., Starting Mon 04/07/2016, Print      I personally performed the services described in this documentation, which was scribed in my presence. The recorded information has been reviewed and is accurate.    Tilden FossaElizabeth Stormie Ventola, MD 04/07/16 [phone]

## 2016-04-07 NOTE — ED Notes (Signed)
Patient Alert and oriented X4. Stable and ambulatory. Patient verbalized understanding of the discharge instructions.  Patient belongings were taken by the patient.  

## 2017-06-09 ENCOUNTER — Emergency Department (HOSPITAL_BASED_OUTPATIENT_CLINIC_OR_DEPARTMENT_OTHER): Payer: Self-pay

## 2017-06-09 ENCOUNTER — Other Ambulatory Visit: Payer: Self-pay

## 2017-06-09 ENCOUNTER — Emergency Department (HOSPITAL_BASED_OUTPATIENT_CLINIC_OR_DEPARTMENT_OTHER)
Admission: EM | Admit: 2017-06-09 | Discharge: 2017-06-09 | Disposition: A | Discharge status: Home / Self Care | Payer: Self-pay | Attending: Emergency Medicine | Admitting: Emergency Medicine

## 2017-06-09 ENCOUNTER — Encounter (HOSPITAL_BASED_OUTPATIENT_CLINIC_OR_DEPARTMENT_OTHER): Payer: Self-pay | Admitting: Emergency Medicine

## 2017-06-09 DIAGNOSIS — J40 Bronchitis, not specified as acute or chronic: Secondary | ICD-10-CM | POA: Insufficient documentation

## 2017-06-09 MED ORDER — AZITHROMYCIN 250 MG PO TABS: ORAL_TABLET | ORAL | 0 refills | Status: DC

## 2017-06-09 MED ORDER — BENZONATATE 100 MG PO CAPS: 100.0000 mg | ORAL_CAPSULE | Freq: Three times a day (TID) | ORAL | 0 refills | Status: DC

## 2017-06-09 MED ORDER — ALBUTEROL SULFATE HFA 108 (90 BASE) MCG/ACT IN AERS
2.0000 | INHALATION_SPRAY | Freq: Once | RESPIRATORY_TRACT | Status: AC
  Administered 2017-06-09: 2 via RESPIRATORY_TRACT
  Filled 2017-06-09: qty 6.7

## 2017-06-09 MED FILL — BENZONATATE 100 MG CAPSULE: 100 | 7 days supply | Qty: 21 | Fill #0

## 2017-06-09 MED FILL — AZITHROMYCIN 250 MG TABLET: 250 | 5 days supply | Qty: 6 | Fill #0

## 2017-06-09 NOTE — ED Triage Notes (Signed)
pateint states that she has had a cough x 2 weeks. The patient states that she is coughing up yellow looking sputum. She has pictures

## 2017-06-09 NOTE — ED Provider Notes (Signed)
[facility] EMERGENCY DEPARTMENT Provider Note   CSN: [id number] Arrival date & time: 06/09/17  1452     History   Chief Complaint Chief Complaint  Patient presents with  . Cough    HPI Brenda West is a 31 y.o. female.  HPI Patient states that she has had a cough for about 2 weeks.  Initially she thought it was her allergies.  She reports however this persisted and gotten worse.  She is now coughing up thick yellow-green sputum.  Occasional chest pain with cough.  Some shortness of breath with coughing episodes.  Patient reports coughing is worse at night.  No documented fever.  She reports at onset she has sore throat but that is improved. Past Medical History:  Diagnosis Date  . Allergy   . Anxiety   . Breast mass   . Bronchitis   . BV (bacterial vaginosis)   . Depression   . Eye irritation   . Headache   . Seasonal allergies   . Tooth abscess   . Vaginal yeast infection   . Yeast infection     There are no active problems to display for this patient.   Past Surgical History:  Procedure Laterality Date  . HIP SURGERY    . WISDOM TOOTH EXTRACTION      OB History    Gravida Para Term Preterm AB Living   0 0 0 0 0 0   SAB TAB Ectopic Multiple Live Births   0 0 0 0         Home Medications    Prior to Admission medications   Medication Sig Start Date End Date Taking? Authorizing Provider  azithromycin (ZITHROMAX Z-PAK) 250 MG tablet 2 po day one, then 1 daily x 4 days 06/09/17   Arby BarrettePfeiffer, Lareta Bruneau, MD  benzonatate (TESSALON) 100 MG capsule Take 1 capsule (100 mg total) by mouth every 8 (eight) hours. 06/09/17   Arby BarrettePfeiffer, Nadia Viar, MD  HYDROcodone-acetaminophen (NORCO/VICODIN) 5-325 MG per tablet Take 1 tablet by mouth every 4 (four) hours as needed. 11/29/14   Rasch, Victorino DikeJennifer I, NP  ibuprofen (ADVIL,MOTRIN) 600 MG tablet Take 1 tablet (600 mg total) by mouth every 6 (six) hours as needed. 11/29/14   Rasch, Victorino DikeJennifer I, NP  metroNIDAZOLE (FLAGYL) 500  MG tablet Take 1 tablet (500 mg total) by mouth 2 (two) times daily. Patient not taking: Reported on 11/29/2014 01/23/14   Margarita Grizzleay, Danielle, MD  metroNIDAZOLE (METROGEL VAGINAL) 0.75 % vaginal gel Place 1 Applicatorful vaginally 2 (two) times daily. Patient not taking: Reported on 11/29/2014 08/15/12   Carleene Cooperavidson, Alan, MD  ondansetron (ZOFRAN ODT) 8 MG disintegrating tablet Take 1 tablet (8 mg total) by mouth every 8 (eight) hours as needed for nausea. Patient not taking: Reported on 11/29/2014 08/22/14   Derwood KaplanNanavati, Ankit, MD    Family History Family History  Problem Relation Age of Onset  . Diabetes Father   . Cancer Father   . Hypertension Sister     Social History Social History   Tobacco Use  . Smoking status: Never Smoker  . Smokeless tobacco: Never Used  Substance Use Topics  . Alcohol use: Yes    Comment: Occasional  . Drug use: No     Allergies   Patient has no known allergies.   Review of Systems Review of Systems 10 Systems reviewed and are negative for acute change except as noted in the HPI.   Physical Exam Updated Vital Signs BP 123/79 (BP Location: Right  Arm)   Pulse 98   Temp 99 F (37.2 C) (Oral)   Resp 18   Ht 5\' 5"  (1.651 m)   Wt 76.7 kg (169 lb)   LMP 05/26/2017   SpO2 100%   BMI 28.12 kg/m   Physical Exam  Constitutional: She is oriented to person, place, and time. She appears well-developed and well-nourished. No distress.  HENT:  Head: Normocephalic and atraumatic.  Nose: Nose normal.  Mouth/Throat: Oropharynx is clear and moist.  Eyes: Conjunctivae and EOM are normal.  Neck: Neck supple.  Cardiovascular: Normal rate and regular rhythm.  No murmur heard. Pulmonary/Chest: Effort normal and breath sounds normal. No respiratory distress.  Abdominal: Soft. There is no tenderness.  Musculoskeletal: She exhibits no edema.  Neurological: She is alert and oriented to person, place, and time. No cranial nerve deficit. She exhibits normal muscle tone.  Coordination normal.  Skin: Skin is warm and dry.  Psychiatric: She has a normal mood and affect.  Nursing note and vitals reviewed.    ED Treatments / Results  Labs (all labs ordered are listed, but only abnormal results are displayed) Labs Reviewed - No data to display  EKG  EKG Interpretation None       Radiology Dg Chest 2 View  Result Date: 06/09/2017 CLINICAL DATA:  Productive cough for 2 weeks.  Shortness of breath. EXAM: CHEST  2 VIEW COMPARISON:  Chest x-ray dated May 17, 2012. FINDINGS: The heart size and mediastinal contours are within normal limits. Both lungs are clear. The visualized skeletal structures are unremarkable. IMPRESSION: No active cardiopulmonary disease. Electronically Signed   By: Obie DredgeWilliam T Derry M.D.   On: 06/09/2017 15:55    Procedures Procedures (including critical care time)  Medications Ordered in ED Medications  albuterol (PROVENTIL HFA;VENTOLIN HFA) 108 (90 Base) MCG/ACT inhaler 2 puff (not administered)     Initial Impression / Assessment and Plan / ED Course  I have reviewed the triage vital signs and the nursing notes.  Pertinent labs & imaging results that were available during my care of the patient were reviewed by me and considered in my medical decision making (see chart for details).     Final Clinical Impressions(s) / ED Diagnoses   Final diagnoses:  Bronchitis   Patient had 2 weeks of cough.  She now has transition to worsening cough with production of thick green sputum.  Treat with Z-Pak for possible secondary infection after bronchitis.  Also albuterol inhaler dispensed with teaching and Tessalon Perles as needed.  Return precautions reviewed. ED Discharge Orders        Ordered    azithromycin (ZITHROMAX Z-PAK) 250 MG tablet     06/09/17 1643    benzonatate (TESSALON) 100 MG capsule  Every 8 hours     06/09/17 1643       Arby BarrettePfeiffer, Sala Tague, MD 06/09/17 1648

## 2017-11-06 ENCOUNTER — Emergency Department (HOSPITAL_BASED_OUTPATIENT_CLINIC_OR_DEPARTMENT_OTHER)
Admission: EM | Admit: 2017-11-06 | Discharge: 2017-11-06 | Disposition: A | Discharge status: Home / Self Care | Payer: Self-pay | Attending: Emergency Medicine | Admitting: Emergency Medicine

## 2017-11-06 ENCOUNTER — Other Ambulatory Visit: Payer: Self-pay

## 2017-11-06 ENCOUNTER — Encounter (HOSPITAL_BASED_OUTPATIENT_CLINIC_OR_DEPARTMENT_OTHER): Payer: Self-pay | Admitting: *Deleted

## 2017-11-06 DIAGNOSIS — B9689 Other specified bacterial agents as the cause of diseases classified elsewhere: Secondary | ICD-10-CM | POA: Insufficient documentation

## 2017-11-06 DIAGNOSIS — B373 Candidiasis of vulva and vagina: Secondary | ICD-10-CM | POA: Insufficient documentation

## 2017-11-06 DIAGNOSIS — B3731 Acute candidiasis of vulva and vagina: Secondary | ICD-10-CM

## 2017-11-06 DIAGNOSIS — N760 Acute vaginitis: Secondary | ICD-10-CM | POA: Insufficient documentation

## 2017-11-06 DIAGNOSIS — B372 Candidiasis of skin and nail: Secondary | ICD-10-CM | POA: Insufficient documentation

## 2017-11-06 DIAGNOSIS — R300 Dysuria: Secondary | ICD-10-CM | POA: Insufficient documentation

## 2017-11-06 DIAGNOSIS — R21 Rash and other nonspecific skin eruption: Secondary | ICD-10-CM | POA: Insufficient documentation

## 2017-11-06 LAB — URINALYSIS, MICROSCOPIC (REFLEX)

## 2017-11-06 LAB — WET PREP, GENITAL
Sperm: NONE SEEN
TRICH WET PREP: NONE SEEN

## 2017-11-06 LAB — URINALYSIS, ROUTINE W REFLEX MICROSCOPIC
Bilirubin Urine: NEGATIVE
Glucose, UA: 100 mg/dL — AB
KETONES UR: NEGATIVE mg/dL
Nitrite: NEGATIVE
PH: 6.5 (ref 5.0–8.0)
Protein, ur: NEGATIVE mg/dL
SPECIFIC GRAVITY, URINE: 1.025 (ref 1.005–1.030)

## 2017-11-06 LAB — PREGNANCY, URINE: Preg Test, Ur: NEGATIVE

## 2017-11-06 MED ORDER — TRIAMCINOLONE ACETONIDE 0.1 % EX CREA: 1.0000 "application " | TOPICAL_CREAM | Freq: Two times a day (BID) | CUTANEOUS | 0 refills | Status: DC

## 2017-11-06 MED ORDER — METRONIDAZOLE 0.75 % VA GEL: 1.0000 | Freq: Two times a day (BID) | VAGINAL | 0 refills | Status: DC

## 2017-11-06 MED ORDER — FLUCONAZOLE 150 MG PO TABS: 150.0000 mg | ORAL_TABLET | Freq: Every day | ORAL | 0 refills | Status: AC

## 2017-11-06 MED ORDER — NYSTATIN 100000 UNIT/GM EX CREA: TOPICAL_CREAM | CUTANEOUS | 0 refills | Status: DC

## 2017-11-06 MED ORDER — METRONIDAZOLE 1 % EX GEL: Freq: Every day | CUTANEOUS | 0 refills | Status: DC

## 2017-11-06 MED FILL — metroNIDAZOLE 0.75 % GEL: 0.75 | 30 days supply | Qty: 70 | Fill #0

## 2017-11-06 MED FILL — FLUCONAZOLE 150 MG TABLET: 150 | 1 days supply | Qty: 1 | Fill #0

## 2017-11-06 MED FILL — NYSTATIN 100,000 UNIT/GM CR: 100000 | 15 days supply | Qty: 15 | Fill #0

## 2017-11-06 NOTE — ED Triage Notes (Signed)
Vaginal discharge and urinary frequency. Rash and odor between her breast.

## 2017-11-06 NOTE — ED Provider Notes (Signed)
[facility] EMERGENCY DEPARTMENT Provider Note   CSN: [id number] Arrival date & time: 11/06/17  1304     History   Chief Complaint Chief Complaint  Patient presents with  . Vaginal Discharge    HPI Brenda West is a 32 y.o. female who presents for evaluation of vaginal discharge that is been ongoing for the last 1.5 weeks.  Patient states it is a malodorous white discharge.  Patient states that she has had some dysuria.  She has not noticed any hematuria.  Patient states she is not currently sexually active.  Patient also reports a rash between bilateral breasts.  She states that this rash began 3 days ago.  Patient states that she does report using a new soap 3 days ago prior to onset of symptoms.  Patient reports she has had a similar rash before in the summer.  Patient denies any breast masses that she has felt.  She denies any nipple discharge.  Patient denies any fevers, abdominal pain.   The history is provided by the patient.    Past Medical History:  Diagnosis Date  . Allergy   . Anxiety   . Breast mass   . Bronchitis   . BV (bacterial vaginosis)   . Depression   . Eye irritation   . Headache   . Seasonal allergies   . Tooth abscess   . Vaginal yeast infection   . Yeast infection     There are no active problems to display for this patient.   Past Surgical History:  Procedure Laterality Date  . HIP SURGERY    . WISDOM TOOTH EXTRACTION       OB History    Gravida  0   Para  0   Term  0   Preterm  0   AB  0   Living  0     SAB  0   TAB  0   Ectopic  0   Multiple  0   Live Births               Home Medications    Prior to Admission medications   Medication Sig Start Date End Date Taking? Authorizing Provider  azithromycin (ZITHROMAX Z-PAK) 250 MG tablet 2 po day one, then 1 daily x 4 days 06/09/17   Arby Barrette, MD  benzonatate (TESSALON) 100 MG capsule Take 1 capsule (100 mg total) by mouth every 8 (eight)  hours. 06/09/17   Arby Barrette, MD  fluconazole (DIFLUCAN) 150 MG tablet Take 1 tablet (150 mg total) by mouth daily for 1 day. 11/06/17 11/07/17  Maxwell Caul, PA-C  HYDROcodone-acetaminophen (NORCO/VICODIN) 5-325 MG per tablet Take 1 tablet by mouth every 4 (four) hours as needed. 11/29/14   Rasch, Victorino Dike I, NP  ibuprofen (ADVIL,MOTRIN) 600 MG tablet Take 1 tablet (600 mg total) by mouth every 6 (six) hours as needed. 11/29/14   Rasch, Victorino Dike I, NP  metroNIDAZOLE (FLAGYL) 500 MG tablet Take 1 tablet (500 mg total) by mouth 2 (two) times daily. Patient not taking: Reported on 11/29/2014 01/23/14   Margarita Grizzle, MD  metroNIDAZOLE (METROGEL VAGINAL) 0.75 % vaginal gel Place 1 Applicatorful vaginally 2 (two) times daily. 11/06/17   Maxwell Caul, PA-C  metroNIDAZOLE (METROGEL) 1 % gel Apply topically daily. 11/06/17   Maxwell Caul, PA-C  nystatin cream (MYCOSTATIN) Apply to affected area 2 times daily 11/06/17   Graciella Freer A, PA-C  ondansetron (ZOFRAN ODT) 8 MG disintegrating tablet  Take 1 tablet (8 mg total) by mouth every 8 (eight) hours as needed for nausea. Patient not taking: Reported on 11/29/2014 08/22/14   Derwood Kaplan, MD  triamcinolone cream (KENALOG) 0.1 % Apply 1 application topically 2 (two) times daily. 11/06/17   Maxwell Caul, PA-C    Family History Family History  Problem Relation Age of Onset  . Diabetes Father   . Cancer Father   . Hypertension Sister     Social History Social History   Tobacco Use  . Smoking status: Never Smoker  . Smokeless tobacco: Never Used  Substance Use Topics  . Alcohol use: Yes    Comment: Occasional  . Drug use: No     Allergies   Patient has no known allergies.   Review of Systems Review of Systems  Constitutional: Negative for fever.  Eyes: Negative for visual disturbance.  Respiratory: Negative for cough and shortness of breath.   Cardiovascular: Negative for chest pain.  Gastrointestinal: Negative for  abdominal pain, diarrhea, nausea and vomiting.  Genitourinary: Positive for dysuria and vaginal discharge. Negative for hematuria.  Musculoskeletal: Negative for back pain and neck pain.  Skin: Positive for rash.  Neurological: Negative for dizziness, weakness, numbness and headaches.  Psychiatric/Behavioral: Negative for confusion.     Physical Exam Updated Vital Signs BP 105/63 (BP Location: Right Arm)   Pulse 84   Temp 98.4 F (36.9 C) (Oral)   Resp 19   Ht  (1.651 m)   Wt 81.6 kg (180 lb)   LMP 10/14/2017   SpO2 99%   BMI 29.95 kg/m   Physical Exam  Constitutional: She is oriented to person, place, and time. She appears well-developed and well-nourished.  HENT:  Head: Normocephalic and atraumatic.  Mouth/Throat: Oropharynx is clear and moist and mucous membranes are normal.  Eyes: Pupils are equal, round, and reactive to light. Conjunctivae, EOM and lids are normal.  Neck: Full passive range of motion without pain.  Cardiovascular: Normal rate, regular rhythm, normal heart sounds and normal pulses. Exam reveals no gallop and no friction rub.  No murmur heard. Pulmonary/Chest: Effort normal and breath sounds normal.  Slight discoloration noted to the inferior aspect of bilateral breasts folds.  Bilateral breasts are without any tenderness, warmth, erythema.  No mass noted on bilateral breast.  No nipple discharge noted. The exam was performed with a chaperone present.  Abdominal: Soft. Normal appearance. There is no tenderness. There is no rigidity, no guarding and no CVA tenderness.  Is soft, nondistended.  No CVA tenderness bilaterally.  Genitourinary: Uterus normal. Cervix exhibits no motion tenderness, no discharge and no friability. Right adnexum displays no mass and no tenderness. Left adnexum displays no mass and no tenderness. Vaginal discharge found.  Genitourinary Comments: The exam was performed with a chaperone present. Normal external female genitalia. No  lesions, rash, or sores.  Patient with white discharge noted in the vaginal canal.  No CMT.  No adnexal tenderness, mass bilaterally.  Musculoskeletal: Normal range of motion.  Neurological: She is alert and oriented to person, place, and time.  Skin: Skin is warm and dry. Capillary refill takes less than 2 seconds.  Psychiatric: She has a normal mood and affect. Her speech is normal.  Nursing note and vitals reviewed.    ED Treatments / Results  Labs (all labs ordered are listed, but only abnormal results are displayed) Labs Reviewed  WET PREP, GENITAL - Abnormal; Notable for the following components:      Result  Value   Yeast Wet Prep HPF POC PRESENT (*)    Clue Cells Wet Prep HPF POC PRESENT (*)    WBC, Wet Prep HPF POC MANY (*)    All other components within normal limits  URINALYSIS, ROUTINE W REFLEX MICROSCOPIC - Abnormal; Notable for the following components:   Glucose, UA 100 (*)    Hgb urine dipstick LARGE (*)    Leukocytes, UA MODERATE (*)    All other components within normal limits  URINALYSIS, MICROSCOPIC (REFLEX) - Abnormal; Notable for the following components:   Bacteria, UA MANY (*)    All other components within normal limits  PREGNANCY, URINE  GC/CHLAMYDIA PROBE AMP ([place]) NOT AT [facility]    EKG None  Radiology No results found.  Procedures Procedures (including critical care time)  Medications Ordered in ED Medications - No data to display   Initial Impression / Assessment and Plan / ED Course  I have reviewed the triage vital signs and the nursing notes.  Pertinent labs & imaging results that were available during my care of the patient were reviewed by me and considered in my medical decision making (see chart for details).     32 year old female who presents for evaluation of 1.5 weeks of vaginal discharge.  Patient reports also she has had some discoloration and rash noted to the bilateral breast.  States she has had similar symptoms  previously during hot weather.  Patient reports some slight dysuria no hematuria.  She is not currently sexually active. Patient is afebril, non-toxic appearing, sitting comfortably on examination table. Vital signs reviewed and stable.  Abdomen is soft, nondistended, nontender.  Breast exam was done with a chaperone.  There is some slight discoloration noted to the inferior aspect of the breast folds.  Breast itself are without any warmth, erythema, tenderness.  Concern for candidiasis infection.  History/physical exam is not concerning for breast abscess, mastitis.  To check UA, urine pregnancy.  Will do pelvic and check gonorrhea/chlamydia and wet prep.  GU exam as documented above.  No CMT tenderness that would be concerning for PID.  Patient did have white discharge.  I discussed treatment options with patient.  She states she does not have any concern for gonorrhea/chlamydia at this time it does not wish to be treated.  She would rather wait for the results to come back.  UA shows hemoglobin,, moderate leukocytes.  No evidence of pyuria, nitrites.  Wet prep is positive for clue cells, yeast.  Urine pregnancy negative.  We will plan to discharge home with treatment for BV, yeast.  Patient reports she does not tolerate metronidazole tablets and rather have the MetroGel.  Additionally, will plan for nystatin cream to help with the breast candidiasis.  Patient also requesting for some cream to help with eczema that she has.  Instructed patient that she should follow-up with primary care doctor for further evaluation. Patient had ample opportunity for questions and discussion. All patient's questions were answered with full understanding. Strict return precautions discussed. Patient expresses understanding and agreement to plan.   Final Clinical Impressions(s) / ED Diagnoses   Final diagnoses:  BV (bacterial vaginosis)  Yeast vaginitis  Skin candidiasis    ED Discharge Orders        Ordered     metroNIDAZOLE (METROGEL) 1 % gel  Daily     11/06/17 1605    nystatin cream (MYCOSTATIN)     11/06/17 1605    triamcinolone cream (KENALOG) 0.1 %  2  times daily     11/06/17 1605    fluconazole (DIFLUCAN) 150 MG tablet  Daily     11/06/17 1605    metroNIDAZOLE (METROGEL VAGINAL) 0.75 % vaginal gel  2 times daily     11/06/17 1639       Rosana Hoes 11/06/17 2029    Maia Plan, MD 11/07/17 [phone]

## 2017-11-06 NOTE — Discharge Instructions (Signed)
Use the nystatin cream on the breast area to help with rash.   Take fluconazole for any yeast infection.  Use the MetroGel as directed.  You can also use the triamcinolone cream on your dry skin, do not put it on your vagina, breast.  Do not get any in her mouth or eyes.  As we discussed, you need to follow-up with a primary care doctor for further evaluation.  I provided a list of resource guide for you.  Additionally, he can follow-[facility] with [facility].  As we discussed, today you are tested for gonorrhea and chlamydia.  The results of this testing not come back for about 2 days.  He will be notified if there is any positive results.  If you are not notified, this means were negative.  Additionally, you can check online to know your results.  Return to the emergency department for any fevers, redness or swelling of the breast, discharge from her nipples, abdominal pain, fevers, any other worsening or concerning symptoms.

## 2017-11-09 LAB — GC/CHLAMYDIA PROBE AMP (~~LOC~~) NOT AT ARMC
Chlamydia: NEGATIVE
Neisseria Gonorrhea: NEGATIVE

## 2018-02-22 ENCOUNTER — Inpatient Hospital Stay (HOSPITAL_COMMUNITY)
Admission: AD | Admit: 2018-02-22 | Discharge: 2018-02-22 | Disposition: A | Discharge status: Home / Self Care | Payer: Self-pay | Source: Ambulatory Visit | Attending: Obstetrics & Gynecology | Admitting: Obstetrics & Gynecology

## 2018-02-22 DIAGNOSIS — M79604 Pain in right leg: Secondary | ICD-10-CM | POA: Insufficient documentation

## 2018-02-22 DIAGNOSIS — B3731 Acute candidiasis of vulva and vagina: Secondary | ICD-10-CM

## 2018-02-22 DIAGNOSIS — Z113 Encounter for screening for infections with a predominantly sexual mode of transmission: Secondary | ICD-10-CM | POA: Insufficient documentation

## 2018-02-22 DIAGNOSIS — B373 Candidiasis of vulva and vagina: Secondary | ICD-10-CM | POA: Insufficient documentation

## 2018-02-22 LAB — URINALYSIS, ROUTINE W REFLEX MICROSCOPIC
Bacteria, UA: NONE SEEN
Bilirubin Urine: NEGATIVE
Glucose, UA: NEGATIVE mg/dL
Ketones, ur: NEGATIVE mg/dL
Leukocytes, UA: NEGATIVE
Nitrite: NEGATIVE
Protein, ur: NEGATIVE mg/dL
Specific Gravity, Urine: 1.029 (ref 1.005–1.030)
pH: 6 (ref 5.0–8.0)

## 2018-02-22 LAB — WET PREP, GENITAL
Sperm: NONE SEEN
Trich, Wet Prep: NONE SEEN

## 2018-02-22 LAB — CBC
HCT: 34.2 % — ABNORMAL LOW (ref 36.0–46.0)
Hemoglobin: 11 g/dL — ABNORMAL LOW (ref 12.0–15.0)
MCH: 26.4 pg (ref 26.0–34.0)
MCHC: 32.2 g/dL (ref 30.0–36.0)
MCV: 82 fL (ref 78.0–100.0)
Platelets: 252 10*3/uL (ref 150–400)
RBC: 4.17 MIL/uL (ref 3.87–5.11)
RDW: 14.4 % (ref 11.5–15.5)
WBC: 5.8 10*3/uL (ref 4.0–10.5)

## 2018-02-22 LAB — POCT PREGNANCY, URINE: PREG TEST UR: NEGATIVE

## 2018-02-22 MED ORDER — FLUCONAZOLE 150 MG PO TABS: 150.0000 mg | ORAL_TABLET | Freq: Every day | ORAL | 1 refills | Status: DC

## 2018-02-22 NOTE — MAU Note (Addendum)
Pt states she fell down some stairs the other night and is having right leg and hip pain. Wants an x-ray. Pt also states she having some vaginal discomfort and vaginal discharge with an odor. Slept with a new partner and worried he took it off. Pt wants STD testing. States she thought she had a yeast infection so she used some OTC medication but did not work. Pt wants prophylactic treatment. Pt denies pain or vaginal bleeding.

## 2018-02-22 NOTE — Discharge Instructions (Signed)
In late 2019, the [facility] will be moving to the [place] campus. At that time, the MAU (Maternity Admissions Unit), where you are being seen today, will no longer take care of non-pregnant patients. We strongly encourage you to find a doctor's office before that time, so that you can be seen with any GYN concerns, like vaginal discharge, urinary tract infection, etc.. in a timely manner. ° °In order to make an office visit more convenient, the [facility] will be offering evening hours with same-day appointments, walk-in appointments and scheduled appointments available during this time. ° °[facility] Hours: °Monday - 8am - 7:30 pm with walk-in between 4pm- 7:30 pm °Tuesday - 8 am - 5 pm (starting 10/06/17 we will be open late and accepting walk-ins from 4pm - 7:30pm) °Wednesday - 8 am - 5 pm (starting 01/06/18 we will be open late and accepting walk-ins from 4pm - 7:30pm) °Thursday 8 am - 5 pm (starting 04/08/18 we will be open late and accepting walk-ins from 4pm - 7:30pm) °Friday 8 am - 5 pm ° °For an appointment please call the [facility] at [phone] ° °For urgent needs, [facility] is also available for management of urgent GYN complaints such as vaginal discharge or urinary tract infections. ° ° ° ° ° °

## 2018-02-22 NOTE — MAU Provider Note (Signed)
S: [name] is a 32 y.o. G0P0000 not currently pregnant who presents to MAU today complaining of vaginal discharge and vaginal discomfort. Patient reports vaginal discharge and discomfort has been occurring for the past week. She reports vaginal discharge is white thin with odor, she denies vaginal bleeding. She describes discomfort as vaginal irritation, denies pain. SHe reports using OTC AZO yeast infection pills with no relief. She reports sleeping new partner and is unsure if partner took off condom. She request STD screening.   O: BP 124/78 (BP Location: Right Arm)   Pulse 85   Temp 98.3 F (36.8 West) (Oral)   Resp 16   Ht 5\' 5"  (1.651 m)   Wt 83.9 kg   LMP 02/05/2018   SpO2 98%   BMI 30.79 kg/m  GENERAL: Well-developed, well-nourished female in no acute distress.  HEAD: Normocephalic, atraumatic.  CHEST: Normal effort of breathing, regular heart rate ABDOMEN: Soft, nontender, gravid PELVIC: Normal external female genitalia. Vagina is pink and rugated. White curdy discharge noted at vaginal introitus, vaginal swabs obtained   Results for orders placed or performed during the hospital encounter of 02/22/18 (from the past 24 hour(s))  Wet prep, genital     Status: Abnormal   Collection Time: 02/22/18 10:28 PM  Result Value Ref Range   Yeast Wet Prep HPF POC PRESENT (A) NONE SEEN   Trich, Wet Prep NONE SEEN NONE SEEN   Clue Cells Wet Prep HPF POC PRESENT (A) NONE SEEN   WBC, Wet Prep HPF POC FEW (A) NONE SEEN   Sperm NONE SEEN   Urinalysis, Routine w reflex microscopic     Status: Abnormal   Collection Time: 02/22/18 10:40 PM  Result Value Ref Range   Color, Urine YELLOW YELLOW   APPearance CLEAR CLEAR   Specific Gravity, Urine 1.029 1.005 - 1.030   pH 6.0 5.0 - 8.0   Glucose, UA NEGATIVE NEGATIVE mg/dL   Hgb urine dipstick MODERATE (A) NEGATIVE   Bilirubin Urine NEGATIVE NEGATIVE   Ketones, ur NEGATIVE NEGATIVE mg/dL   Protein, ur NEGATIVE NEGATIVE mg/dL   Nitrite NEGATIVE NEGATIVE   Leukocytes, UA NEGATIVE NEGATIVE   RBC / HPF 21-50 0 - 5 RBC/hpf   WBC, UA 0-5 0 - 5 WBC/hpf   Bacteria, UA NONE SEEN NONE SEEN   Squamous Epithelial / LPF 0-5 0 - 5   Mucus PRESENT   Pregnancy, urine POC     Status: None   Collection Time: 02/22/18 10:45 PM  Result Value Ref Range   Preg Test, Ur NEGATIVE NEGATIVE  CBC     Status: Abnormal   Collection Time: 02/22/18 10:50 PM  Result Value Ref Range   WBC 5.8 4.0 - 10.5 K/uL   RBC 4.17 3.87 - 5.11 MIL/uL   Hemoglobin 11.0 (L) 12.0 - 15.0 g/dL   HCT 16.134.2 (L) 09.636.0 - 04.546.0 %   MCV 82.0 78.0 - 100.0 fL   MCH 26.4 26.0 - 34.0 pg   MCHC 32.2 30.0 - 36.0 g/dL   RDW 40.914.4 81.111.5 - 91.415.5 %   Platelets 252 150 - 400 K/uL   MDM: Wet prep GC/West CBC RPR HIV UPT   Meds ordered this encounter  Medications  . fluconazole (DIFLUCAN) 150 MG tablet    Sig: Take 1 tablet (150 mg total) by mouth daily.    Dispense:  1 tablet    Refill:  1    Order Specific Question:   Supervising Provider    Answer:  Brenda PhenixRNOLD, Brenda G [[id number]]   Discussed results with patient of yeast infection, medication ordered for treatment. Educated on results of STD testing does not result for 2-3 days. Will notify patient if positive. Patient verbalizes understanding. Patient is mobile and able to get up and down on bed, patient has complete ROM on leg- no xray completed.   A: 1. Vaginal yeast infection   2. Screening examination for STD (sexually transmitted disease)   3. Pain of right lower extremity     P: Discharge home  Follow up with PCP or urgent care for worsening symptoms  Discussed reasons to return to MAU  Rx for Diflucan sent to pharmacy of choice    Sharyon CableRogers, Brenda West, CNM 02/22/2018 11:27 PM

## 2018-02-23 LAB — GC/CHLAMYDIA PROBE AMP (~~LOC~~) NOT AT ARMC
Chlamydia: NEGATIVE
Neisseria Gonorrhea: NEGATIVE

## 2018-02-23 LAB — HIV ANTIBODY (ROUTINE TESTING W REFLEX): HIV Screen 4th Generation wRfx: NONREACTIVE

## 2018-02-23 LAB — RPR: RPR Ser Ql: NONREACTIVE

## 2018-06-20 ENCOUNTER — Encounter (HOSPITAL_COMMUNITY): Payer: Self-pay | Admitting: *Deleted

## 2018-06-20 ENCOUNTER — Inpatient Hospital Stay (HOSPITAL_COMMUNITY)
Admission: AD | Admit: 2018-06-20 | Discharge: 2018-06-20 | Disposition: A | Discharge status: Home / Self Care | Payer: Self-pay | Source: Ambulatory Visit | Attending: Family Medicine | Admitting: Family Medicine

## 2018-06-20 DIAGNOSIS — B3731 Acute candidiasis of vulva and vagina: Secondary | ICD-10-CM

## 2018-06-20 DIAGNOSIS — B373 Candidiasis of vulva and vagina: Secondary | ICD-10-CM

## 2018-06-20 DIAGNOSIS — Z8249 Family history of ischemic heart disease and other diseases of the circulatory system: Secondary | ICD-10-CM | POA: Insufficient documentation

## 2018-06-20 DIAGNOSIS — N760 Acute vaginitis: Secondary | ICD-10-CM | POA: Insufficient documentation

## 2018-06-20 DIAGNOSIS — Z833 Family history of diabetes mellitus: Secondary | ICD-10-CM | POA: Insufficient documentation

## 2018-06-20 DIAGNOSIS — Z3202 Encounter for pregnancy test, result negative: Secondary | ICD-10-CM | POA: Insufficient documentation

## 2018-06-20 DIAGNOSIS — B9689 Other specified bacterial agents as the cause of diseases classified elsewhere: Secondary | ICD-10-CM | POA: Insufficient documentation

## 2018-06-20 DIAGNOSIS — B379 Candidiasis, unspecified: Secondary | ICD-10-CM | POA: Insufficient documentation

## 2018-06-20 LAB — URINALYSIS, ROUTINE W REFLEX MICROSCOPIC
BILIRUBIN URINE: NEGATIVE
Glucose, UA: NEGATIVE mg/dL
Ketones, ur: 15 mg/dL — AB
Leukocytes, UA: NEGATIVE
Nitrite: NEGATIVE
PROTEIN: NEGATIVE mg/dL
Specific Gravity, Urine: >1.03 — ABNORMAL HIGH (ref 1.005–1.030)
pH: 5.5 (ref 5.0–8.0)

## 2018-06-20 LAB — URINALYSIS, MICROSCOPIC (REFLEX)

## 2018-06-20 LAB — WET PREP, GENITAL
SPERM: NONE SEEN
Trich, Wet Prep: NONE SEEN

## 2018-06-20 LAB — POCT PREGNANCY, URINE: Preg Test, Ur: NEGATIVE

## 2018-06-20 MED ORDER — METRONIDAZOLE 500 MG PO TABS: 500.0000 mg | ORAL_TABLET | Freq: Two times a day (BID) | ORAL | 0 refills | Status: DC

## 2018-06-20 MED ORDER — FLUCONAZOLE 150 MG PO TABS: 150.0000 mg | ORAL_TABLET | Freq: Once | ORAL | 0 refills | Status: AC

## 2018-06-20 MED ORDER — FLUCONAZOLE 150 MG PO TABS: 150.0000 mg | ORAL_TABLET | Freq: Once | ORAL | 0 refills | Status: DC

## 2018-06-20 MED ORDER — METRONIDAZOLE 0.75 % VA GEL: 1.0000 | Freq: Two times a day (BID) | VAGINAL | 0 refills | Status: DC

## 2018-06-20 NOTE — Discharge Instructions (Signed)

## 2018-06-20 NOTE — Progress Notes (Signed)
WRitten and verbal d/c instructions given and understanding voiced.  

## 2018-06-20 NOTE — MAU Provider Note (Signed)
History     CSN: [id number]  Arrival date and time: 06/20/18 [id number]   First Provider Initiated Contact with Patient 06/20/18 [phone]      Chief Complaint  Patient presents with  . Vaginal Discharge  . Vaginal Itching  . Possible Pregnancy   HPI Brenda West is a 32 y.o. G0P0000 non-pregnant patient with chief complaint of vaginal discharge. This is a recurring problem. Patient endorses thick vaginal discharge, not foul smelling, intermittently itchy. She denies urinary symptoms, back or abdominal pain, discomfort during sex, fever or recent illness. She has not tried any treatments for this complaint.   Past Medical History:  Diagnosis Date  . Allergy   . Anxiety   . Breast mass   . Bronchitis   . BV (bacterial vaginosis)   . Depression   . Eye irritation   . Headache   . Seasonal allergies   . Tooth abscess   . Vaginal yeast infection   . Yeast infection     Past Surgical History:  Procedure Laterality Date  . HIP SURGERY    . WISDOM TOOTH EXTRACTION      Family History  Problem Relation Age of Onset  . Diabetes Father   . Cancer Father   . Hypertension Sister     Social History   Tobacco Use  . Smoking status: Never Smoker  . Smokeless tobacco: Never Used  Substance Use Topics  . Alcohol use: Yes    Comment: Occasional  . Drug use: No    Allergies: No Known Allergies  Medications Prior to Admission  Medication Sig Dispense Refill Last Dose  . ibuprofen (ADVIL,MOTRIN) 600 MG tablet Take 1 tablet (600 mg total) by mouth every 6 (six) hours as needed. 30 tablet 0 Past Month at Unknown time  . nystatin cream (MYCOSTATIN) Apply to affected area 2 times daily 15 g 0 Past Week at Unknown time  . triamcinolone cream (KENALOG) 0.1 % Apply 1 application topically 2 (two) times daily. 30 g 0 Past Week at Unknown time  . benzonatate (TESSALON) 100 MG capsule Take 1 capsule (100 mg total) by mouth every 8 (eight) hours. 21 capsule 0   . fluconazole  (DIFLUCAN) 150 MG tablet Take 1 tablet (150 mg total) by mouth daily. 1 tablet 1   . ondansetron (ZOFRAN ODT) 8 MG disintegrating tablet Take 1 tablet (8 mg total) by mouth every 8 (eight) hours as needed for nausea. (Patient not taking: Reported on 11/29/2014) 20 tablet 0     Review of Systems  Constitutional: Negative for chills, fatigue and fever.  Respiratory: Negative for shortness of breath.   Gastrointestinal: Negative for abdominal distention, abdominal pain, diarrhea, nausea and vomiting.  Genitourinary: Positive for vaginal discharge. Negative for flank pain, pelvic pain, vaginal bleeding and vaginal pain.  Musculoskeletal: Negative for back pain.  All other systems reviewed and are negative.  Physical Exam   Blood pressure 136/83, pulse 71, temperature 97.9 F (36.6 C), resp. rate 18, last menstrual period 05/11/2018.  Physical Exam  Nursing note and vitals reviewed. Constitutional: She is oriented to person, place, and time. She appears well-developed and well-nourished.  Cardiovascular: Normal rate.  Respiratory: Effort normal.  GI: Soft. She exhibits no distension. There is no abdominal tenderness. There is no rebound, no guarding and no CVA tenderness.  Genitourinary:    No vaginal discharge.   Neurological: She is alert and oriented to person, place, and time.  Skin: Skin is warm and dry.  Psychiatric: She  has a normal mood and affect. Her behavior is normal. Judgment and thought content normal.    MAU Course/MDM    Patient Vitals for the past 24 hrs:  BP Temp Pulse Resp  06/20/18 0409 119/84 - 70 16  06/20/18 0317 136/83 97.9 F (36.6 C) 71 18    Results for orders placed or performed during the hospital encounter of 06/20/18 (from the past 24 hour(s))  Urinalysis, Routine w reflex microscopic     Status: Abnormal   Collection Time: 06/20/18  3:37 AM  Result Value Ref Range   Color, Urine YELLOW YELLOW   APPearance CLEAR CLEAR   Specific Gravity, Urine  >1.030 (H) 1.005 - 1.030   pH 5.5 5.0 - 8.0   Glucose, UA NEGATIVE NEGATIVE mg/dL   Hgb urine dipstick LARGE (A) NEGATIVE   Bilirubin Urine NEGATIVE NEGATIVE   Ketones, ur 15 (A) NEGATIVE mg/dL   Protein, ur NEGATIVE NEGATIVE mg/dL   Nitrite NEGATIVE NEGATIVE   Leukocytes, UA NEGATIVE NEGATIVE  Urinalysis, Microscopic (reflex)     Status: Abnormal   Collection Time: 06/20/18  3:37 AM  Result Value Ref Range   RBC / HPF 0-5 0 - 5 RBC/hpf   WBC, UA 0-5 0 - 5 WBC/hpf   Bacteria, UA RARE (A) NONE SEEN   Squamous Epithelial / LPF 0-5 0 - 5   Mucus PRESENT    Crystals CA OXALATE CRYSTALS (A) NEGATIVE  Pregnancy, urine POC     Status: None   Collection Time: 06/20/18  3:41 AM  Result Value Ref Range   Preg Test, Ur NEGATIVE NEGATIVE  Wet prep, genital     Status: Abnormal   Collection Time: 06/20/18  3:45 AM  Result Value Ref Range   Yeast Wet Prep HPF POC PRESENT (A) NONE SEEN   Trich, Wet Prep NONE SEEN NONE SEEN   Clue Cells Wet Prep HPF POC PRESENT (A) NONE SEEN   WBC, Wet Prep HPF POC FEW (A) NONE SEEN   Sperm NONE SEEN    Meds ordered this encounter  Medications  . fluconazole (DIFLUCAN) 150 MG tablet    Sig: Take 1 tablet (150 mg total) by mouth once for 1 dose. May repeat in 2 days if still having symptoms.    Dispense:  2 tablet    Refill:  0    Order Specific Question:   Supervising Provider    Answer:   Reva Bores [[id number]]  . metroNIDAZOLE (METROGEL VAGINAL) 0.75 % vaginal gel    Sig: Place 1 Applicatorful vaginally 2 (two) times daily.    Dispense:  70 g    Refill:  0    Order Specific Question:   Supervising Provider    Answer:   Reva Bores [[id number]]     Assessment and Plan  --32 y.o. G0P0000 negative pregnancy test --Yeast infection and Bacterial Vaginosis, rx to pharmacy. Patient prefers Metrogel to PO --Discharge home in stable condition  Brenda West, [place] 06/20/2018, 4:34 AM

## 2018-06-20 NOTE — MAU Note (Signed)
Having vag irritation for 2 days. Some clear d/c with odor. Want to be checked for STDs and possibly pregnant. LMP 05/11/18. Occ abd pain.

## 2018-06-20 NOTE — MAU Note (Signed)
Pt preferred to do self swab for STDs. Swabs obtained by pt and sent to lab

## 2018-06-21 LAB — GC/CHLAMYDIA PROBE AMP (~~LOC~~) NOT AT ARMC
Chlamydia: NEGATIVE
NEISSERIA GONORRHEA: NEGATIVE

## 2018-08-05 ENCOUNTER — Ambulatory Visit
Admission: EM | Admit: 2018-08-05 | Discharge: 2018-08-05 | Disposition: A | Discharge status: Home / Self Care | Payer: Self-pay | Attending: Emergency Medicine | Admitting: Emergency Medicine

## 2018-08-05 ENCOUNTER — Encounter: Payer: Self-pay | Admitting: Emergency Medicine

## 2018-08-05 DIAGNOSIS — K5900 Constipation, unspecified: Secondary | ICD-10-CM

## 2018-08-05 DIAGNOSIS — J019 Acute sinusitis, unspecified: Secondary | ICD-10-CM

## 2018-08-05 MED ORDER — PSEUDOEPH-BROMPHEN-DM 30-2-10 MG/5ML PO SYRP: 5.0000 mL | ORAL_SOLUTION | Freq: Four times a day (QID) | ORAL | 0 refills | Status: DC | PRN

## 2018-08-05 MED ORDER — DOXYCYCLINE HYCLATE 100 MG PO CAPS: 100.0000 mg | ORAL_CAPSULE | Freq: Two times a day (BID) | ORAL | 0 refills | Status: AC

## 2018-08-05 MED ORDER — CETIRIZINE HCL 10 MG PO CAPS: 10.0000 mg | ORAL_CAPSULE | Freq: Every day | ORAL | 0 refills | Status: DC

## 2018-08-05 NOTE — Discharge Instructions (Signed)
Begin doxycycline twice daily for 1 week- this will cover for infection in sinuses as well as some infection in lungs Begin daily cetirizine/Zyrtec or Claritin to help with congestion drainage that could be irritating throat, may get this over-the-counter and get generic if this is cheaper for you May continue to use nasal spray or Mucinex May use cough syrup provided or over-the-counter Delsym, Robitussin-DM Tylenol and ibuprofen for headache, body aches Follow-up if symptoms not resolving, worsening, developing fever, shortness of breath or difficulty breathing, chest discomfort  Constipation If you have not had a bowel movement in 3 to 4 days, may begin taking MiraLAX with docusate.  Take Miralax once a day for the next 2-3 days. Please also start docusate/colace stool softener, twice a day for at least 1 week. If stools become loose, cut down to once a day for another week. If stools remain loose, cut back to 1 pill every other day for a third week. You can stop docusate thereafter and resume as needed for constipation.  To help reduce constipation and promote bowel health: 1. Drink at least 64 ounces of water each day 2. Eat plenty of fiber (fruits, vegetables, whole grains, legumes) 3. Be physically active or exercise including walking, jogging, swimming, yoga, etc. 4. For active constipation use a stool softener (docusate) or an osmotic laxative (like Miralax) each day, or as needed.  Follow-up if bowels still not moving, developing increased pain, nausea or vomiting

## 2018-08-05 NOTE — ED Notes (Signed)
Patient able to ambulate independently  

## 2018-08-05 NOTE — ED Provider Notes (Signed)
[name]    CSN: [id number] Arrival date & time: 08/05/18  1411     History   Chief Complaint Chief Complaint  Patient presents with  . URI    HPI Brenda West is a 33 y.o. female history of seasonal allergies presenting today for evaluation of URI symptoms.  Patient has had cough, nasal congestion and a mild sore throat.  Symptoms began approximately 1-1/2 weeks ago.  She discharge with over-the-counter DayQuil, Hall's, nasal spray without relief.  She denies any fevers.  Noticed symptoms worse at nighttime.  Patient is also concerned about constipation.  States that over the past month she has had some stomach discomfort off and on.  She states that she has approximately 2 bowel movements a day which is improved from previous regularity.  Denies straining.  Denies nausea or vomiting.  Tolerating oral intake without worsening of discomfort.  HPI  Past Medical History:  Diagnosis Date  . Allergy   . Anxiety   . Breast mass   . Bronchitis   . BV (bacterial vaginosis)   . Depression   . Eye irritation   . Headache   . Seasonal allergies   . Tooth abscess   . Vaginal yeast infection   . Yeast infection     There are no active problems to display for this patient.   Past Surgical History:  Procedure Laterality Date  . HIP SURGERY    . WISDOM TOOTH EXTRACTION      OB History    Gravida  0   Para  0   Term  0   Preterm  0   AB  0   Living  0     SAB  0   TAB  0   Ectopic  0   Multiple  0   Live Births               Home Medications    Prior to Admission medications   Medication Sig Start Date End Date Taking? Authorizing Provider  brompheniramine-pseudoephedrine-DM 30-2-10 MG/5ML syrup Take 5 mLs by mouth 4 (four) times daily as needed. 08/05/18   Vernesha Talbot C, PA-C  Cetirizine HCl 10 MG CAPS Take 1 capsule (10 mg total) by mouth daily for 10 days. 08/05/18 08/15/18  Khristy Kalan C, PA-C  doxycycline (VIBRAMYCIN)  100 MG capsule Take 1 capsule (100 mg total) by mouth 2 (two) times daily for 7 days. 08/05/18 08/12/18  Anthea Udovich C, PA-C  ibuprofen (ADVIL,MOTRIN) 600 MG tablet Take 1 tablet (600 mg total) by mouth every 6 (six) hours as needed. 11/29/14   Rasch, Victorino DikeJennifer I, NP  metroNIDAZOLE (METROGEL VAGINAL) 0.75 % vaginal gel Place 1 Applicatorful vaginally 2 (two) times daily. 06/20/18   Calvert CantorWeinhold, Samantha C, CNM    Family History Family History  Problem Relation Age of Onset  . Diabetes Father   . Cancer Father   . Hypertension Sister     Social History Social History   Tobacco Use  . Smoking status: Never Smoker  . Smokeless tobacco: Never Used  Substance Use Topics  . Alcohol use: Yes    Comment: Occasional  . Drug use: No     Allergies   Patient has no known allergies.   Review of Systems Review of Systems  Constitutional: Negative for activity change, appetite change, chills, fatigue and fever.  HENT: Positive for congestion, rhinorrhea and sore throat. Negative for ear pain, sinus pressure and trouble swallowing.  Eyes: Negative for discharge and redness.  Respiratory: Positive for cough. Negative for chest tightness and shortness of breath.   Cardiovascular: Negative for chest pain.  Gastrointestinal: Positive for abdominal pain and constipation. Negative for diarrhea, nausea and vomiting.  Musculoskeletal: Negative for myalgias.  Skin: Negative for rash.  Neurological: Negative for dizziness, light-headedness and headaches.     Physical Exam Triage Vital Signs ED Triage Vitals  Enc Vitals Group     BP 08/05/18 1422 125/83     Pulse Rate 08/05/18 1422 79     Resp 08/05/18 1422 18     Temp 08/05/18 1422 97.9 F (36.6 C)     Temp Source 08/05/18 1422 Oral     SpO2 08/05/18 1422 96 %     Weight --      Height --      Head Circumference --      Peak Flow --      Pain Score 08/05/18 1424 0     Pain Loc --      Pain Edu? --      Excl. in GC? --    No data  found.  Updated Vital Signs BP 125/83 (BP Location: Left Arm)   Pulse 79   Temp 97.9 F (36.6 C) (Oral)   Resp 18   LMP 07/29/2018   SpO2 96%   Visual Acuity Right Eye Distance:   Left Eye Distance:   Bilateral Distance:    Right Eye Near:   Left Eye Near:    Bilateral Near:     Physical Exam Vitals signs and nursing note reviewed.  Constitutional:      General: She is not in acute distress.    Appearance: She is well-developed.  HENT:     Head: Normocephalic and atraumatic.     Ears:     Comments: Bilateral ears without tenderness to palpation of external auricle, tragus and mastoid, EAC's without erythema or swelling, TM's with good bony landmarks and cone of light. Non erythematous.    Nose:     Comments: Nasal mucosa erythematous, mildly swollen turbinate    Mouth/Throat:     Comments: Oral mucosa pink and moist, no tonsillar enlargement or exudate. Posterior pharynx patent and nonerythematous, no uvula deviation or swelling. Normal phonation. Eyes:     Conjunctiva/sclera: Conjunctivae normal.  Neck:     Musculoskeletal: Neck supple.  Cardiovascular:     Rate and Rhythm: Normal rate and regular rhythm.     Heart sounds: No murmur.  Pulmonary:     Effort: Pulmonary effort is normal. No respiratory distress.     Breath sounds: Normal breath sounds.     Comments: Breathing comfortably at rest, CTABL, no wheezing, rales or other adventitious sounds auscultated Abdominal:     Palpations: Abdomen is soft.     Tenderness: There is no abdominal tenderness.     Comments: Abdomen soft, nondistended, nontender to light and deep patient throughout abdomen  Skin:    General: Skin is warm and dry.  Neurological:     Mental Status: She is alert.      UC Treatments / Results  Labs (all labs ordered are listed, but only abnormal results are displayed) Labs Reviewed - No data to display  EKG None  Radiology No results found.  Procedures Procedures (including  critical care time)  Medications Ordered in UC Medications - No data to display  Initial Impression / Assessment and Plan / UC Course  I have reviewed the triage  vital signs and the nursing notes.  Pertinent labs & imaging results that were available during my care of the patient were reviewed by me and considered in my medical decision making (see chart for details).     Signs stable, lungs clear, exam nonfocal, given length of symptoms of almost 2 weeks will cover with doxycycline to cover for sinusitis as well as atypical respiratory illness.  Also recommended further symptomatic and supportive care Zyrtec for congestion and drainage, cough syrup as needed, Tylenol and ibuprofen for body aches, headache and fevers.  Push fluids, rest.    Patient also with constipation, 2 bowel movements a week, will recommend lifestyle modifications initially and MiraLAX and docusate as needed for further constipation.  Do not suspect underlying obstruction at this time, will defer imaging.  Continue to monitor symptoms,Discussed strict return precautions. Patient verbalized understanding and is agreeable with plan.  Final Clinical Impressions(s) / UC Diagnoses   Final diagnoses:  Acute sinusitis with symptoms > 10 days  Constipation, unspecified constipation type     Discharge Instructions     Begin doxycycline twice daily for 1 week- this will cover for infection in sinuses as well as some infection in lungs Begin daily cetirizine/Zyrtec or Claritin to help with congestion drainage that could be irritating throat, may get this over-the-counter and get generic if this is cheaper for you May continue to use nasal spray or Mucinex May use cough syrup provided or over-the-counter Delsym, Robitussin-DM Tylenol and ibuprofen for headache, body aches Follow-up if symptoms not resolving, worsening, developing fever, shortness of breath or difficulty breathing, chest discomfort  Constipation If you  have not had a bowel movement in 3 to 4 days, may begin taking MiraLAX with docusate.  Take Miralax once a day for the next 2-3 days. Please also start docusate/colace stool softener, twice a day for at least 1 week. If stools become loose, cut down to once a day for another week. If stools remain loose, cut back to 1 pill every other day for a third week. You can stop docusate thereafter and resume as needed for constipation.  To help reduce constipation and promote bowel health: 1. Drink at least 64 ounces of water each day 2. Eat plenty of fiber (fruits, vegetables, whole grains, legumes) 3. Be physically active or exercise including walking, jogging, swimming, yoga, etc. 4. For active constipation use a stool softener (docusate) or an osmotic laxative (like Miralax) each day, or as needed.  Follow-up if bowels still not moving, developing increased pain, nausea or vomiting   ED Prescriptions    Medication Sig Dispense Auth. Provider   doxycycline (VIBRAMYCIN) 100 MG capsule Take 1 capsule (100 mg total) by mouth 2 (two) times daily for 7 days. 14 capsule Shala Baumbach C, PA-C   Cetirizine HCl 10 MG CAPS Take 1 capsule (10 mg total) by mouth daily for 10 days. 10 capsule Faye Strohman C, PA-C   brompheniramine-pseudoephedrine-DM 30-2-10 MG/5ML syrup Take 5 mLs by mouth 4 (four) times daily as needed. 120 mL Pharrah Rottman C, PA-C     Controlled Substance Prescriptions [place] Controlled Substance Registry consulted? Not Applicable   Lew Dawes, [place] 08/05/18 1512

## 2018-08-05 NOTE — ED Triage Notes (Signed)
PT presents to [facility] for assessment of generalized abdominal pain, coughing, nasal and chest congestion.  Pt c/o some constipation as well.

## 2018-09-12 ENCOUNTER — Emergency Department (HOSPITAL_BASED_OUTPATIENT_CLINIC_OR_DEPARTMENT_OTHER): Payer: Self-pay

## 2018-09-12 ENCOUNTER — Other Ambulatory Visit: Payer: Self-pay

## 2018-09-12 ENCOUNTER — Emergency Department (HOSPITAL_BASED_OUTPATIENT_CLINIC_OR_DEPARTMENT_OTHER)
Admission: EM | Admit: 2018-09-12 | Discharge: 2018-09-12 | Disposition: A | Discharge status: Home / Self Care | Payer: Self-pay | Attending: Emergency Medicine | Admitting: Emergency Medicine

## 2018-09-12 ENCOUNTER — Encounter (HOSPITAL_BASED_OUTPATIENT_CLINIC_OR_DEPARTMENT_OTHER): Payer: Self-pay | Admitting: Emergency Medicine

## 2018-09-12 DIAGNOSIS — N3000 Acute cystitis without hematuria: Secondary | ICD-10-CM | POA: Insufficient documentation

## 2018-09-12 DIAGNOSIS — K5900 Constipation, unspecified: Secondary | ICD-10-CM

## 2018-09-12 LAB — PREGNANCY, URINE: Preg Test, Ur: NEGATIVE

## 2018-09-12 LAB — URINALYSIS, ROUTINE W REFLEX MICROSCOPIC
BILIRUBIN URINE: NEGATIVE
GLUCOSE, UA: NEGATIVE mg/dL
Ketones, ur: NEGATIVE mg/dL
Nitrite: POSITIVE — AB
Protein, ur: 100 mg/dL — AB
Specific Gravity, Urine: 1.02 (ref 1.005–1.030)
pH: 7 (ref 5.0–8.0)

## 2018-09-12 LAB — URINALYSIS, MICROSCOPIC (REFLEX): WBC, UA: >50 WBC/hpf (ref 0–5)

## 2018-09-12 MED ORDER — CEPHALEXIN 500 MG PO CAPS: 500.0000 mg | ORAL_CAPSULE | Freq: Two times a day (BID) | ORAL | 0 refills | Status: AC

## 2018-09-12 MED ORDER — POLYETHYLENE GLYCOL 3350 17 G PO PACK: 17.0000 g | PACK | Freq: Every day | ORAL | 0 refills | Status: AC

## 2018-09-12 MED ORDER — FLUCONAZOLE 200 MG PO TABS: 200.0000 mg | ORAL_TABLET | Freq: Every day | ORAL | 0 refills | Status: AC | PRN

## 2018-09-12 MED ORDER — SENNOSIDES-DOCUSATE SODIUM 8.6-50 MG PO TABS: 1.0000 | ORAL_TABLET | Freq: Every evening | ORAL | 0 refills | Status: AC | PRN

## 2018-09-12 NOTE — ED Provider Notes (Signed)
Emergency Department Provider Note   I have reviewed the triage vital signs and the nursing notes.   HISTORY  Chief Complaint Constipation   HPI Brenda West is a 33 y.o. female with PMH of allergy and anxiety resents to the emergency department for evaluation of constipation over the past 3 weeks.  This began in the setting of using multiple herbal supplements.  She has also had urine hesitancy with urgency and foul-smelling urine.  No nausea or vomiting.  She does have bowel movements but states they are small and hard.  She has not tried any stool softener or laxative.  No blood in the bowel movements.  Denies chest pain or shortness of breath.  No fevers or chills.   Past Medical History:  Diagnosis Date  . Allergy   . Anxiety   . Breast mass   . Bronchitis   . BV (bacterial vaginosis)   . Depression   . Eye irritation   . Headache   . Seasonal allergies   . Tooth abscess   . Vaginal yeast infection   . Yeast infection     There are no active problems to display for this patient.   Past Surgical History:  Procedure Laterality Date  . HIP SURGERY    . WISDOM TOOTH EXTRACTION      Allergies Patient has no known allergies.  Family History  Problem Relation Age of Onset  . Diabetes Father   . Cancer Father   . Hypertension Sister     Social History Social History   Tobacco Use  . Smoking status: Never Smoker  . Smokeless tobacco: Never Used  Substance Use Topics  . Alcohol use: Yes    Comment: Occasional  . Drug use: No    Review of Systems  Constitutional: No fever/chills Eyes: No visual changes. ENT: No sore throat. Cardiovascular: Denies chest pain. Respiratory: Denies shortness of breath. Gastrointestinal: No abdominal pain. No nausea, no vomiting. No diarrhea.  Positive constipation. Genitourinary: Negative for dysuria. Positive urgency and foul urine smell.  Musculoskeletal: Negative for back pain. Skin: Negative for  rash. Neurological: Negative for headaches, focal weakness or numbness.  10-point ROS otherwise negative.  ____________________________________________   PHYSICAL EXAM:  VITAL SIGNS: ED Triage Vitals  Enc Vitals Group     BP 09/12/18 0717 (!) 144/81     Pulse Rate 09/12/18 0717 85     Resp 09/12/18 0717 18     Temp 09/12/18 0717 98.1 F (36.7 C)     Temp Source 09/12/18 0717 Oral     SpO2 09/12/18 0717 100 %     Weight 09/12/18 0719 180 lb (81.6 kg)     Height 09/12/18 0719 5\' 5"  (1.651 m)     Pain Score 09/12/18 0715 9   Constitutional: Alert and oriented. Well appearing and in no acute distress. Eyes: Conjunctivae are normal. Head: Atraumatic. Nose: No congestion/rhinnorhea. Mouth/Throat: Mucous membranes are moist.  Neck: No stridor.   Cardiovascular: Normal rate, regular rhythm. Good peripheral circulation. Grossly normal heart sounds.   Respiratory: Normal respiratory effort.  No retractions. Lungs CTAB. Gastrointestinal: Soft and nontender. No distention.  Musculoskeletal: No lower extremity tenderness nor edema. No gross deformities of extremities. Neurologic:  Normal speech and language. No gross focal neurologic deficits are appreciated.  Skin:  Skin is warm, dry and intact. No rash noted.  ____________________________________________   LABS (all labs ordered are listed, but only abnormal results are displayed)  Labs Reviewed  URINALYSIS, ROUTINE  W REFLEX MICROSCOPIC - Abnormal; Notable for the following components:      Result Value   APPearance CLOUDY (*)    Hgb urine dipstick MODERATE (*)    Protein, ur 100 (*)    Nitrite POSITIVE (*)    Leukocytes,Ua LARGE (*)    All other components within normal limits  URINALYSIS, MICROSCOPIC (REFLEX) - Abnormal; Notable for the following components:   Bacteria, UA MANY (*)    All other components within normal limits  PREGNANCY, URINE   ____________________________________________  RADIOLOGY  Dg Abdomen  Acute W/chest  Result Date: 09/12/2018 CLINICAL DATA:  Dysuria for 3 days. Constipation for 3 weeks. EXAM: DG ABDOMEN ACUTE W/ 1V CHEST COMPARISON:  Chest radiograph 06/09/2017 FINDINGS: Frontal view of the chest demonstrates tracheal deviation minimally left secondary to patient rotation. Normal heart size and mediastinal contours. No pleural effusion or pneumothorax. Clear lungs. Abdominal films demonstrate no free intraperitoneal air or significant air-fluid levels on upright positioning. Moderate amount of stool throughout the colon. No abnormal abdominal calcifications. No appendicolith. Probable phleboliths in the left hemipelvis. Right proximal femur fixation. IMPRESSION: 1. No acute findings. 2. Possible constipation. Electronically Signed   By: Jeronimo Greaves M.D.   On: 09/12/2018 10:13    ____________________________________________   PROCEDURES  Procedure(s) performed:   Procedures  None  ____________________________________________   INITIAL IMPRESSION / ASSESSMENT AND PLAN / ED COURSE  Pertinent labs & imaging results that were available during my care of the patient were reviewed by me and considered in my medical decision making (see chart for details).  Patient presents to the emergency department for evaluation of constipation, abdominal discomfort, urine hesitancy and foul odor.  Some frequency noted as well.  UA pending.  No concern for pyelonephritis.  Abdominal exam is diffusely nontender to palpation.  Plan for plain film of the abdomen along with UA and reassess.  UA with evidence of UTI. KUB without obstruction. Constipation noted. Plan for Miralax and Pericolace along with PCP follow up. Discussed ED return precautions.  ____________________________________________  FINAL CLINICAL IMPRESSION(S) / ED DIAGNOSES  Final diagnoses:  Acute cystitis without hematuria  Constipation, unspecified constipation type    NEW OUTPATIENT MEDICATIONS STARTED DURING THIS  VISIT:  Discharge Medication List as of 09/12/2018 10:04 AM    START taking these medications   Details  cephALEXin (KEFLEX) 500 MG capsule Take 1 capsule (500 mg total) by mouth 2 (two) times daily for 7 days., Starting Sun 09/12/2018, Until Sun 09/19/2018, Normal    polyethylene glycol (MIRALAX) packet Take 17 g by mouth daily for 14 days., Starting Sun 09/12/2018, Until Sun 09/26/2018, Normal    senna-docusate (SENOKOT-S) 8.6-50 MG tablet Take 1 tablet by mouth at bedtime as needed for up to 14 days for mild constipation or moderate constipation., Starting Sun 09/12/2018, Until Sun 09/26/2018, Normal        Note:  This document was prepared using Dragon voice recognition software and may include unintentional dictation errors.  Alona Bene, MD Emergency Medicine    Long, Arlyss Repress, MD 09/12/18 1332

## 2018-09-12 NOTE — ED Notes (Signed)
Pt verbalized understanding of dc instructions.

## 2018-09-12 NOTE — ED Notes (Signed)
Pt ambulated to XR at this time.

## 2018-09-12 NOTE — Discharge Instructions (Signed)

## 2018-09-12 NOTE — ED Triage Notes (Signed)
Patient states that she has not has a true BM in about 3 weeks and trouble urinating for about 3 days  - Patient has an odor to her urine. Denies any N/V

## 2018-11-25 ENCOUNTER — Inpatient Hospital Stay (HOSPITAL_COMMUNITY)
Admission: AC | Admit: 2018-11-25 | Discharge: 2018-11-25 | Disposition: A | Discharge status: Home / Self Care | Payer: Medicaid Other | Attending: Family Medicine | Admitting: Family Medicine

## 2018-11-25 ENCOUNTER — Inpatient Hospital Stay (HOSPITAL_COMMUNITY): Payer: Medicaid Other

## 2018-11-25 ENCOUNTER — Other Ambulatory Visit: Payer: Self-pay

## 2018-11-25 ENCOUNTER — Ambulatory Visit
Admission: EM | Admit: 2018-11-25 | Discharge: 2018-11-25 | Disposition: A | Discharge status: Home / Self Care | Payer: Self-pay | Attending: Physician Assistant | Admitting: Physician Assistant

## 2018-11-25 ENCOUNTER — Encounter (HOSPITAL_COMMUNITY): Payer: Self-pay

## 2018-11-25 ENCOUNTER — Encounter: Payer: Self-pay | Admitting: Physician Assistant

## 2018-11-25 DIAGNOSIS — Z3A01 Less than 8 weeks gestation of pregnancy: Secondary | ICD-10-CM | POA: Diagnosis not present

## 2018-11-25 DIAGNOSIS — O3680X0 Pregnancy with inconclusive fetal viability, not applicable or unspecified: Secondary | ICD-10-CM | POA: Insufficient documentation

## 2018-11-25 DIAGNOSIS — O2311 Infections of bladder in pregnancy, first trimester: Secondary | ICD-10-CM | POA: Insufficient documentation

## 2018-11-25 DIAGNOSIS — Z79899 Other long term (current) drug therapy: Secondary | ICD-10-CM | POA: Diagnosis not present

## 2018-11-25 DIAGNOSIS — Z3201 Encounter for pregnancy test, result positive: Secondary | ICD-10-CM

## 2018-11-25 DIAGNOSIS — Z809 Family history of malignant neoplasm, unspecified: Secondary | ICD-10-CM | POA: Diagnosis not present

## 2018-11-25 DIAGNOSIS — O9989 Other specified diseases and conditions complicating pregnancy, childbirth and the puerperium: Secondary | ICD-10-CM | POA: Insufficient documentation

## 2018-11-25 DIAGNOSIS — R109 Unspecified abdominal pain: Secondary | ICD-10-CM | POA: Diagnosis not present

## 2018-11-25 DIAGNOSIS — Z833 Family history of diabetes mellitus: Secondary | ICD-10-CM | POA: Diagnosis not present

## 2018-11-25 DIAGNOSIS — N3001 Acute cystitis with hematuria: Secondary | ICD-10-CM

## 2018-11-25 DIAGNOSIS — R102 Pelvic and perineal pain: Secondary | ICD-10-CM

## 2018-11-25 LAB — URINALYSIS, COMPLETE (UACMP) WITH MICROSCOPIC
Bilirubin Urine: NEGATIVE
Glucose, UA: 50 mg/dL — AB
Ketones, ur: 20 mg/dL — AB
Leukocytes,Ua: NEGATIVE
Nitrite: NEGATIVE
Protein, ur: 30 mg/dL — AB
Specific Gravity, Urine: 1.031 — ABNORMAL HIGH (ref 1.005–1.030)
pH: 5 (ref 5.0–8.0)

## 2018-11-25 LAB — POCT URINALYSIS DIP (MANUAL ENTRY)
Glucose, UA: NEGATIVE mg/dL
Leukocytes, UA: NEGATIVE
Nitrite, UA: NEGATIVE
Protein Ur, POC: 100 mg/dL — AB
Spec Grav, UA: >=1.03 — AB (ref 1.010–1.025)
Urobilinogen, UA: 1 E.U./dL
pH, UA: 5.5 (ref 5.0–8.0)

## 2018-11-25 LAB — COMPREHENSIVE METABOLIC PANEL
ALT: 17 U/L (ref 0–44)
AST: 15 U/L (ref 15–41)
Albumin: 3.7 g/dL (ref 3.5–5.0)
Alkaline Phosphatase: 31 U/L — ABNORMAL LOW (ref 38–126)
Anion gap: 10 (ref 5–15)
BUN: 7 mg/dL (ref 6–20)
CO2: 23 mmol/L (ref 22–32)
Calcium: 9 mg/dL (ref 8.9–10.3)
Chloride: 106 mmol/L (ref 98–111)
Creatinine, Ser: 0.85 mg/dL (ref 0.44–1.00)
GFR calc Af Amer: >60 mL/min (ref >60)
GFR calc non Af Amer: >60 mL/min (ref >60)
Glucose, Bld: 114 mg/dL — ABNORMAL HIGH (ref 70–99)
Potassium: 3.1 mmol/L — ABNORMAL LOW (ref 3.5–5.1)
Sodium: 139 mmol/L (ref 135–145)
Total Bilirubin: 0.2 mg/dL — ABNORMAL LOW (ref 0.3–1.2)
Total Protein: 7.4 g/dL (ref 6.5–8.1)

## 2018-11-25 LAB — WET PREP, GENITAL
Clue Cells Wet Prep HPF POC: NONE SEEN
Sperm: NONE SEEN
Trich, Wet Prep: NONE SEEN
Yeast Wet Prep HPF POC: NONE SEEN

## 2018-11-25 LAB — CBC
HCT: 37.9 % (ref 36.0–46.0)
Hemoglobin: 12.7 g/dL (ref 12.0–15.0)
MCH: 27.4 pg (ref 26.0–34.0)
MCHC: 33.5 g/dL (ref 30.0–36.0)
MCV: 81.9 fL (ref 80.0–100.0)
Platelets: 296 10*3/uL (ref 150–400)
RBC: 4.63 MIL/uL (ref 3.87–5.11)
RDW: 13.3 % (ref 11.5–15.5)
WBC: 5.9 10*3/uL (ref 4.0–10.5)
nRBC: 0 % (ref 0.0–0.2)

## 2018-11-25 LAB — POCT URINE PREGNANCY: Preg Test, Ur: POSITIVE — AB

## 2018-11-25 LAB — HCG, QUANTITATIVE, PREGNANCY: hCG, Beta Chain, Quant, S: 2353 m[IU]/mL — ABNORMAL HIGH (ref <5)

## 2018-11-25 MED ORDER — FLEET ENEMA 7-19 GM/118ML RE ENEM
1.0000 | ENEMA | Freq: Once | RECTAL | Status: AC
  Administered 2018-11-25: 1 via RECTAL

## 2018-11-25 MED ORDER — CEPHALEXIN 500 MG PO CAPS: 500.0000 mg | ORAL_CAPSULE | Freq: Four times a day (QID) | ORAL | 0 refills | Status: DC

## 2018-11-25 MED ORDER — DOCUSATE SODIUM 100 MG PO CAPS: 100.0000 mg | ORAL_CAPSULE | Freq: Two times a day (BID) | ORAL | 0 refills | Status: DC

## 2018-11-25 NOTE — ED Notes (Signed)
Patient able to ambulate independently  

## 2018-11-25 NOTE — MAU Note (Signed)
Pt presents to MAU with abdominal pain x 5 days. Went urgent care today and +preg test. She has had brownish discharge but no bright red bleeding.

## 2018-11-25 NOTE — Discharge Instructions (Signed)
Urine pregnancy test positive. Given abdominal pain, please go to the [facility] ED for further evaluation and management needed.

## 2018-11-25 NOTE — ED Triage Notes (Signed)
Pt presents to [facility] for assessment of abdominal pain, constipation, nausea, loss of appetite x 5 days.  Patient also c/o swollen hands.  States she tried some of her laxative without success, but did not take the whole thing.

## 2018-11-25 NOTE — ED Provider Notes (Signed)
[name]    CSN: [id number] Arrival date & time: 11/25/18  1053     History   Chief Complaint Chief Complaint  Patient presents with  . Nausea  . Abdominal Pain    HPI Brenda West is a 33 y.o. female.   33 year old female comes in for 5-day history of abdominal pain, constipation, nausea, loss of appetite, feeling bloated, hot flashes. She denies any vomiting. She has suprapubic/low abdominal pain that is intermittent, stabbing sensation, worse with laying on the side. She has urinary frequency, dysuria. Denies frank vaginal bleeding, but has noticed brown discharge. Denies other discharge, vaginal itching. Denies fever, chills, night sweats. Sexually active with 1 female partner, no condom use, no birth control use.      Past Medical History:  Diagnosis Date  . Allergy   . Anxiety   . Breast mass   . Bronchitis   . BV (bacterial vaginosis)   . Depression   . Eye irritation   . Headache   . Seasonal allergies   . Tooth abscess   . Vaginal yeast infection   . Yeast infection     There are no active problems to display for this patient.   Past Surgical History:  Procedure Laterality Date  . HIP SURGERY    . WISDOM TOOTH EXTRACTION      OB History    Gravida  0   Para  0   Term  0   Preterm  0   AB  0   Living  0     SAB  0   TAB  0   Ectopic  0   Multiple  0   Live Births               Home Medications    Prior to Admission medications   Medication Sig Start Date End Date Taking? Authorizing Provider  Cetirizine HCl 10 MG CAPS Take 1 capsule (10 mg total) by mouth daily for 10 days. 08/05/18 08/15/18  Wieters, Hallie C, PA-C  ibuprofen (ADVIL,MOTRIN) 600 MG tablet Take 1 tablet (600 mg total) by mouth every 6 (six) hours as needed. 11/29/14   Rasch, Harolyn Rutherford, NP    Family History Family History  Problem Relation Age of Onset  . Diabetes Father   . Cancer Father   . Hypertension Sister     Social History  Social History   Tobacco Use  . Smoking status: Never Smoker  . Smokeless tobacco: Never Used  Substance Use Topics  . Alcohol use: Yes    Comment: Occasional  . Drug use: No     Allergies   Patient has no known allergies.   Review of Systems Review of Systems  Reason unable to perform ROS: See HPI as above.     Physical Exam Triage Vital Signs ED Triage Vitals  Enc Vitals Group     BP 11/25/18 1104 133/86     Pulse Rate 11/25/18 1104 (!) 103     Resp 11/25/18 1104 18     Temp 11/25/18 1104 99.2 F (37.3 C)     Temp Source 11/25/18 1104 Oral     SpO2 11/25/18 1104 97 %     Weight --      Height --      Head Circumference --      Peak Flow --      Pain Score 11/25/18 1105 6     Pain Loc --  Pain Edu? --      Excl. in GC? --    No data found.  Updated Vital Signs BP 133/86 (BP Location: Left Arm)   Pulse (!) 103   Temp 99.2 F (37.3 C) (Oral)   Resp 18   LMP 10/20/2018 (Approximate)   SpO2 97%   Physical Exam Constitutional:      General: She is not in acute distress.    Appearance: She is well-developed. She is not ill-appearing, toxic-appearing or diaphoretic.  HENT:     Head: Normocephalic and atraumatic.  Eyes:     Conjunctiva/sclera: Conjunctivae normal.     Pupils: Pupils are equal, round, and reactive to light.  Cardiovascular:     Rate and Rhythm: Normal rate and regular rhythm.     Heart sounds: Normal heart sounds. No murmur. No friction rub. No gallop.   Pulmonary:     Effort: Pulmonary effort is normal.     Breath sounds: Normal breath sounds. No wheezing or rales.  Abdominal:     General: Bowel sounds are normal.     Palpations: Abdomen is soft.     Tenderness: There is abdominal tenderness in the suprapubic area. There is no right CVA tenderness, left CVA tenderness, guarding or rebound.  Skin:    General: Skin is warm and dry.  Neurological:     Mental Status: She is alert and oriented to person, place, and time.   Psychiatric:        Behavior: Behavior normal.        Judgment: Judgment normal.      UC Treatments / Results  Labs (all labs ordered are listed, but only abnormal results are displayed) Labs Reviewed  POCT URINALYSIS DIP (MANUAL ENTRY) - Abnormal; Notable for the following components:      Result Value   Bilirubin, UA moderate (*)    Ketones, POC UA small (15) (*)    Spec Grav, UA >=1.030 (*)    Blood, UA large (*)    Protein Ur, POC =100 (*)    All other components within normal limits  POCT URINE PREGNANCY - Abnormal; Notable for the following components:   Preg Test, Ur Positive (*)    All other components within normal limits    EKG None  Radiology No results found.  Procedures Procedures (including critical care time)  Medications Ordered in UC Medications - No data to display  Initial Impression / Assessment and Plan / UC Course  I have reviewed the triage vital signs and the nursing notes.  Pertinent labs & imaging results that were available during my care of the patient were reviewed by me and considered in my medical decision making (see chart for details).    Urine positive for pregnancy. Patient with suprapubic tenderness that is reproducible on palpation, no guarding or rebound. [facility] and management needed.  Final Clinical Impressions(s) / UC Diagnoses   Final diagnoses:  Pregnancy test positive  Suprapubic pain   ED Prescriptions    None        Belinda FisherYu, Amy V, PA-C 11/25/18 1212

## 2018-11-25 NOTE — MAU Provider Note (Addendum)
Patient Brenda West is a 33 y.o. G1P0000 At [redacted]w[redacted]d by uncertain LMP here with multiple complaints of abdominal pain, vaginal bleeding, constipation and sweating on her legs. She has an ongoing history of constipation and "sweating on her legs". She declines NV, SOB, difficulty breathing, dysuria.    History     CSN: [id number]  Arrival date and time: 11/25/18 1425   None     Chief Complaint  Patient presents with  . Abdominal Pain   Abdominal Pain  This is a new problem. The current episode started in the past 7 days. The problem occurs constantly. The problem has been unchanged. The pain is located in the suprapubic region. The pain is at a severity of 6/10. The quality of the pain is cramping. The abdominal pain does not radiate. Associated symptoms include constipation. Pertinent negatives include no diarrhea, dysuria, fever or vomiting.  Vaginal Bleeding  The patient's primary symptoms include vaginal bleeding. The patient's pertinent negatives include no genital itching or genital lesions. This is a new problem. The current episode started yesterday. The problem occurs intermittently. The problem has been unchanged. Associated symptoms include abdominal pain and constipation. Pertinent negatives include no diarrhea, dysuria, fever, urgency or vomiting. The vaginal bleeding is spotting. She has not been passing clots. She has not been passing tissue. Nothing aggravates the symptoms. She has tried nothing for the symptoms.    OB History    Gravida  1   Para  0   Term  0   Preterm  0   AB  0   Living  0     SAB  0   TAB  0   Ectopic  0   Multiple  0   Live Births              Past Medical History:  Diagnosis Date  . Allergy   . Anxiety   . Breast mass   . Bronchitis   . BV (bacterial vaginosis)   . Depression   . Eye irritation   . Headache   . Seasonal allergies   . Tooth abscess   . Vaginal yeast infection   . Yeast infection     Past  Surgical History:  Procedure Laterality Date  . HIP SURGERY    . WISDOM TOOTH EXTRACTION      Family History  Problem Relation Age of Onset  . Diabetes Father   . Cancer Father   . Hypertension Sister     Social History   Tobacco Use  . Smoking status: Never Smoker  . Smokeless tobacco: Never Used  Substance Use Topics  . Alcohol use: Not Currently    Comment: Occasional  . Drug use: Not Currently    Types: Marijuana    Comment: Last Use beginning of May 2020    Allergies: No Known Allergies  Medications Prior to Admission  Medication Sig Dispense Refill Last Dose  . Cetirizine HCl 10 MG CAPS Take 1 capsule (10 mg total) by mouth daily for 10 days. 10 capsule 0   . ibuprofen (ADVIL,MOTRIN) 600 MG tablet Take 1 tablet (600 mg total) by mouth every 6 (six) hours as needed. 30 tablet 0 Past Month at Unknown time    Review of Systems  Constitutional: Negative for fever.  Gastrointestinal: Positive for abdominal pain and constipation. Negative for diarrhea and vomiting.  Genitourinary: Positive for vaginal bleeding. Negative for dysuria and urgency.   Physical Exam   Blood pressure 130/67, pulse Marland Kitchen(!)  109, temperature 99.2 F (37.3 C), temperature source Oral, resp. rate 18, height 5\' 5"  (1.651 m), weight 86.2 kg, last menstrual period 10/25/2018, SpO2 100 %.  Physical Exam  Constitutional: She is oriented to person, place, and time. She appears well-developed.  HENT:  Head: Normocephalic.  Neck: Normal range of motion.  Respiratory: Effort normal.  GI: Soft.  Genitourinary:    Genitourinary Comments: NEFG; dark brown old blood in the vagina but no blood extruding from the os; slight suprapubic tenderness.    Musculoskeletal: Normal range of motion.  Neurological: She is alert and oriented to person, place, and time.  Skin: Skin is warm and dry.    MAU Course  Procedures  MDM -complete ectopic work up done -US shows intrauterine gestational sac but no yolk sac;  technically pregnancy of unknown location.  -Beta is 2,353 -Fleet enema for constipation; patient feels some relief.   Patient now reporting that she has a history of UTI; she recently had a UTI but didn't finish her medicine.  UA shows probable UTI; will send urine with culture and treat apophylactically.   Patient reports very stressed; her father died last year and she is in an unstable living situation. She desires to keep this pregnancy.   Assessment and Plan   1. Pregnancy of unknown anatomic location   2. Abdominal pain   3. Acute cystitis with hematuria    2. OB Urine culture pending; GC chlamydia pending.  3. Keflex RX sent; GoodRx given for keflex. RX for docusate sodium given; advised she can buy it over the counter if necessary.  4. Message sent to [facility] to start prenatal care as patient has been there before.  5. Strict ectopic warning signs given; patient verbalized understanding of plan of care and will return on Saturday.    Charlesetta Garibaldi  11/25/2018, 3:49 PM

## 2018-11-25 NOTE — Discharge Instructions (Signed)
-  return on Saturday afternoon, 5-23 for repeat lab draws -if any increase in pain or increase in bleeding, please come back to MAU before 11-27-2018   Ectopic Pregnancy  An ectopic pregnancy happens when a fertilized egg grows outside the womb (uterus). The fertilized egg cannot stay alive outside of the womb. This problem often happens in a fallopian tube. It is often caused by damage to the tube. If this problem is found early, you may be treated with medicine that stops the egg from growing. If your tube tears or bursts open (ruptures), you will bleed inside. Often, there is very bad pain in the lower belly. This is an emergency. You will need surgery. Get help right away. Follow these instructions at home: After being treated with medicine or surgery:  Rest and limit your activity for as long as told by your doctor.  Until your doctor says that it is safe: ? Do not lift anything that is heavier than 10 lb (4.5 kg) or the limit that your doctor tells you. ? Avoid exercise and any movement that takes a lot of effort.  To prevent problems when pooping (constipation): ? Eat a healthy diet. This includes:  Fruits.  Vegetables.  Whole grains. ? Drink 6-8 glasses of water a day. Contact a doctor if: Get help right away if:  You have sudden and very bad pain in your belly.  You have very bad pain in your shoulders or neck.  You have pain that gets worse and is not helped by medicine.  You have: ? A fever or chills. ? Vaginal bleeding. ? Redness or swelling at the site of a surgical cut (incision).  You feel sick to your stomach (nauseous) or you throw up (vomit).  You feel dizzy or weak.  You feel light-headed or you pass out (faint). Summary  An ectopic pregnancy happens when a fertilized egg grows outside the womb (uterus).  If this problem is found early, you may be treated with medicine that stops the egg from growing.  If your tube tears or bursts open (ruptures),  you will need surgery. This is an emergency. Get help right away. This information is not intended to replace advice given to you by your health care provider. Make sure you discuss any questions you have with your health care provider. Document Released: 09/19/2008 Document Revised: 07/17/2016 Document Reviewed: 07/17/2016 Elsevier Interactive Patient Education  2019 [employer].

## 2018-11-26 LAB — ABO/RH: ABO/RH(D): A POS

## 2018-11-26 LAB — CULTURE, OB URINE: Culture: <10000 — AB

## 2018-11-26 LAB — GC/CHLAMYDIA PROBE AMP (~~LOC~~) NOT AT ARMC
Chlamydia: NEGATIVE
Neisseria Gonorrhea: NEGATIVE

## 2018-11-27 ENCOUNTER — Other Ambulatory Visit: Payer: Self-pay

## 2018-11-27 ENCOUNTER — Inpatient Hospital Stay (HOSPITAL_COMMUNITY)
Admission: AD | Admit: 2018-11-27 | Discharge: 2018-11-27 | Disposition: A | Discharge status: Home / Self Care | Payer: Medicaid Other | Source: Ambulatory Visit | Attending: Obstetrics & Gynecology | Admitting: Obstetrics & Gynecology

## 2018-11-27 DIAGNOSIS — Z3A01 Less than 8 weeks gestation of pregnancy: Secondary | ICD-10-CM

## 2018-11-27 DIAGNOSIS — O3680X0 Pregnancy with inconclusive fetal viability, not applicable or unspecified: Secondary | ICD-10-CM

## 2018-11-27 LAB — HCG, QUANTITATIVE, PREGNANCY: hCG, Beta Chain, Quant, S: 4929 m[IU]/mL — ABNORMAL HIGH (ref <5)

## 2018-11-27 NOTE — MAU Note (Signed)
Brenda West is a 33 y.o. at [redacted]w[redacted]d here in MAU reporting: here for follow up HCG, not having any pain or vaginal bleeding. Reports some blood in her urine, states she is taking abx for the UTI  Pain score: 0/10  Vitals:   11/27/18 1747  BP: 117/65  Pulse: (!) 104  Resp: 16  Temp: 99.8 F (37.7 C)  SpO2: 100%      Lab orders placed from triage: hcg order entered

## 2018-11-27 NOTE — MAU Note (Signed)
Pt discharged over the phone by provider. Discharge instructions given by provider.

## 2018-11-27 NOTE — MAU Provider Note (Signed)
Patient Brenda West is a 33 y.o.  G1P0000 at [redacted]w[redacted]d by LMP here for repeat bHCG level.  She was seen in MAU on 11-25-2018 and diagnosed with pregnancy of unknown location and instructed to follow up at MAU on 11-27-2018.   Patient was brought into triage room for blood draw and physical assessment; she denies abdominal pain and bleeding at this time. Patient then returned to her car to wait for phone call with results.   Beta hcg is 4900 today; appropriate rise. Will order outpatient [place] for viability; order placed. Phone call to patient with results; patient given strict return precautions and verbalized understanding of plan of care.   Luna Kitchens CNM

## 2018-12-13 ENCOUNTER — Other Ambulatory Visit: Payer: Self-pay

## 2018-12-13 ENCOUNTER — Encounter: Payer: Self-pay | Admitting: Family Medicine

## 2018-12-13 ENCOUNTER — Ambulatory Visit (HOSPITAL_COMMUNITY)
Admission: RE | Admit: 2018-12-13 | Discharge: 2018-12-13 | Disposition: A | Discharge status: Home / Self Care | Payer: Medicaid Other | Source: Ambulatory Visit | Attending: Student | Admitting: Student

## 2018-12-13 ENCOUNTER — Ambulatory Visit (INDEPENDENT_AMBULATORY_CARE_PROVIDER_SITE_OTHER): Payer: Medicaid Other

## 2018-12-13 DIAGNOSIS — O3680X0 Pregnancy with inconclusive fetal viability, not applicable or unspecified: Secondary | ICD-10-CM | POA: Diagnosis not present

## 2018-12-13 DIAGNOSIS — Z712 Person consulting for explanation of examination or test findings: Secondary | ICD-10-CM

## 2018-12-13 NOTE — Progress Notes (Signed)
Pt here today for OB US results.  Pt informed that she has a viable pregnancy with EDD 08/01/2019 and 7w today.  Medications reconciled.  [place] office to provide proof of pregnancy letter to start prenatal care.    Mel Almond, RN 12/13/18

## 2018-12-15 NOTE — Progress Notes (Signed)
Agree with A & P. 

## 2018-12-20 ENCOUNTER — Encounter (HOSPITAL_COMMUNITY): Payer: Self-pay

## 2018-12-20 ENCOUNTER — Other Ambulatory Visit: Payer: Self-pay

## 2018-12-20 ENCOUNTER — Inpatient Hospital Stay (HOSPITAL_COMMUNITY)
Admission: AD | Admit: 2018-12-20 | Discharge: 2018-12-20 | Disposition: A | Discharge status: Home / Self Care | Payer: Medicaid Other | Source: Ambulatory Visit | Attending: Obstetrics & Gynecology | Admitting: Obstetrics & Gynecology

## 2018-12-20 DIAGNOSIS — O210 Mild hyperemesis gravidarum: Secondary | ICD-10-CM | POA: Insufficient documentation

## 2018-12-20 DIAGNOSIS — Z3A08 8 weeks gestation of pregnancy: Secondary | ICD-10-CM

## 2018-12-20 DIAGNOSIS — K5909 Other constipation: Secondary | ICD-10-CM

## 2018-12-20 DIAGNOSIS — O219 Vomiting of pregnancy, unspecified: Secondary | ICD-10-CM

## 2018-12-20 DIAGNOSIS — O26891 Other specified pregnancy related conditions, first trimester: Secondary | ICD-10-CM | POA: Diagnosis not present

## 2018-12-20 LAB — URINALYSIS, ROUTINE W REFLEX MICROSCOPIC
Bilirubin Urine: NEGATIVE
Glucose, UA: NEGATIVE mg/dL
Ketones, ur: NEGATIVE mg/dL
Leukocytes,Ua: NEGATIVE
Nitrite: NEGATIVE
Protein, ur: 30 mg/dL — AB
Specific Gravity, Urine: 1.029 (ref 1.005–1.030)
pH: 5 (ref 5.0–8.0)

## 2018-12-20 LAB — WET PREP, GENITAL
Clue Cells Wet Prep HPF POC: NONE SEEN
Sperm: NONE SEEN
Trich, Wet Prep: NONE SEEN
Yeast Wet Prep HPF POC: NONE SEEN

## 2018-12-20 LAB — CBC
HCT: 33.5 % — ABNORMAL LOW (ref 36.0–46.0)
Hemoglobin: 11.1 g/dL — ABNORMAL LOW (ref 12.0–15.0)
MCH: 27.1 pg (ref 26.0–34.0)
MCHC: 33.1 g/dL (ref 30.0–36.0)
MCV: 81.7 fL (ref 80.0–100.0)
Platelets: 314 10*3/uL (ref 150–400)
RBC: 4.1 MIL/uL (ref 3.87–5.11)
RDW: 13.7 % (ref 11.5–15.5)
WBC: 11 10*3/uL — ABNORMAL HIGH (ref 4.0–10.5)
nRBC: 0 % (ref 0.0–0.2)

## 2018-12-20 LAB — COMPREHENSIVE METABOLIC PANEL
ALT: 15 U/L (ref 0–44)
AST: 14 U/L — ABNORMAL LOW (ref 15–41)
Albumin: 3.3 g/dL — ABNORMAL LOW (ref 3.5–5.0)
Alkaline Phosphatase: 40 U/L (ref 38–126)
Anion gap: 9 (ref 5–15)
BUN: 6 mg/dL (ref 6–20)
CO2: 21 mmol/L — ABNORMAL LOW (ref 22–32)
Calcium: 8.8 mg/dL — ABNORMAL LOW (ref 8.9–10.3)
Chloride: 103 mmol/L (ref 98–111)
Creatinine, Ser: 0.7 mg/dL (ref 0.44–1.00)
GFR calc Af Amer: >60 mL/min (ref >60)
GFR calc non Af Amer: >60 mL/min (ref >60)
Glucose, Bld: 86 mg/dL (ref 70–99)
Potassium: 3.6 mmol/L (ref 3.5–5.1)
Sodium: 133 mmol/L — ABNORMAL LOW (ref 135–145)
Total Bilirubin: 0.5 mg/dL (ref 0.3–1.2)
Total Protein: 6.4 g/dL — ABNORMAL LOW (ref 6.5–8.1)

## 2018-12-20 LAB — GC/CHLAMYDIA PROBE AMP (~~LOC~~) NOT AT ARMC
Chlamydia: NEGATIVE
Neisseria Gonorrhea: NEGATIVE

## 2018-12-20 MED ORDER — DOCUSATE SODIUM 100 MG PO CAPS: 100.0000 mg | ORAL_CAPSULE | Freq: Two times a day (BID) | ORAL | 0 refills | Status: DC

## 2018-12-20 MED ORDER — ONDANSETRON 4 MG PO TBDP
8.0000 mg | ORAL_TABLET | Freq: Once | ORAL | Status: AC
  Administered 2018-12-20: 8 mg via ORAL
  Filled 2018-12-20: qty 2

## 2018-12-20 MED ORDER — POLYETHYLENE GLYCOL 3350 17 G PO PACK: 17.0000 g | PACK | Freq: Every day | ORAL | 1 refills | Status: DC

## 2018-12-20 MED ORDER — PRENATAL GUMMIES/DHA & FA 0.4-32.5 MG PO CHEW: 1.0000 | CHEWABLE_TABLET | Freq: Every day | ORAL | 1 refills | Status: DC

## 2018-12-20 MED ORDER — ONDANSETRON HCL 8 MG PO TABS: 8.0000 mg | ORAL_TABLET | Freq: Three times a day (TID) | ORAL | 1 refills | Status: DC | PRN

## 2018-12-20 MED ORDER — PROMETHAZINE HCL 25 MG PO TABS: 25.0000 mg | ORAL_TABLET | Freq: Four times a day (QID) | ORAL | 0 refills | Status: DC | PRN

## 2018-12-20 NOTE — MAU Provider Note (Addendum)
Patient Brenda West is a 33 y.o.  G1P0000 At [redacted]w[redacted]d by early [place] here with complaints of NV and unable to sleep. She denies vaginal bleeding, abdominal pain or pelvic pain. She is under significant social emotional stress (recent loss of nephew, has unstable living environment). She denies HA, dysuria, chills, SOB, fever, body aches.  History     CSN: [id number]  Arrival date and time: 12/20/18 0246   None     Chief Complaint  Patient presents with  . Abdominal Pain  . Nausea  . Emesis   Emesis  This is a new problem. The current episode started in the past 7 days. The problem occurs 5 to 10 times per day. The problem has been unchanged. The emesis has an appearance of bile and stomach contents. There has been no fever. Pertinent negatives include no abdominal pain, chills, diarrhea or fever.   She is also very concerned because she is unable to sleep at night. States that she wakes up at midnight and is awake "until the sun comes up".    Patient tried eating cereal and tacos yesterday; she threw that.  OB History    Gravida  1   Para  0   Term  0   Preterm  0   AB  0   Living  0     SAB  0   TAB  0   Ectopic  0   Multiple  0   Live Births              Past Medical History:  Diagnosis Date  . Allergy   . Anxiety   . Breast mass   . Bronchitis   . BV (bacterial vaginosis)   . Depression   . Eye irritation   . Headache   . Seasonal allergies   . Tooth abscess   . Vaginal yeast infection   . Yeast infection     Past Surgical History:  Procedure Laterality Date  . HIP SURGERY    . WISDOM TOOTH EXTRACTION      Family History  Problem Relation Age of Onset  . Diabetes Father   . Cancer Father   . Hypertension Sister     Social History   Tobacco Use  . Smoking status: Never Smoker  . Smokeless tobacco: Never Used  Substance Use Topics  . Alcohol use: Not Currently    Comment: Occasional  . Drug use: Not Currently    Types:  Marijuana    Comment: Last Use beginning of May 2020    Allergies: No Known Allergies  Medications Prior to Admission  Medication Sig Dispense Refill Last Dose  . docusate sodium (COLACE) 100 MG capsule Take 1 capsule (100 mg total) by mouth every 12 (twelve) hours. (Patient not taking: Reported on 12/13/2018) 60 capsule 0     Review of Systems  Constitutional: Negative.  Negative for chills and fever.  HENT: Negative.   Gastrointestinal: Positive for vomiting. Negative for abdominal pain and diarrhea.  Genitourinary: Negative.   Neurological: Negative.   Hematological: Negative.   Psychiatric/Behavioral: Negative.    Physical Exam   Blood pressure (!) 104/55, pulse 72, temperature 98.3 F (36.8 C), temperature source Oral, resp. rate 18, height 5\' 5"  (1.651 m), weight 89.6 kg, last menstrual period 10/25/2018, SpO2 100 %.  Physical Exam  Constitutional: She is oriented to person, place, and time. She appears well-developed.  HENT:  Head: Normocephalic.  Neck: Normal range of motion.  GI:  Soft.  Musculoskeletal: Normal range of motion.  Neurological: She is alert and oriented to person, place, and time.  Skin: Skin is warm and dry.  Psychiatric: She has a normal mood and affect.    MAU Course  Procedures  MDM -Zofran ODT at 0554; wait 30 minutes and then give PO challenge. 0800: Patient tolerated ginger ale and slept; states that she feels "much better" after sleeping.  -UA negative for ketones -CBC and CMP are normal -will do GC chlamydia and wet prep as patient reporting occasional discolored brown discharge but no active bleeding or pain.   Assessment and Plan   1. Nausea and vomiting in pregnancy   2. Chronic constipation    2. Patient stable for discharge with RX for Zofran and phenergan. Explained that phenergan will help with sleep. Cautioned patient against overuse of Zofran, as it increases risk of constipation. She will plan to use docusate sodium and  Miralax to help with constipation.  3. Patient requesting Gummy vitamin; RX sent.  4. Patient will call [facility] today to start prenatal care.    Charlesetta GaribaldiKathryn Lorraine Livia Tarr 12/20/2018, 5:56 AM

## 2018-12-20 NOTE — Discharge Instructions (Signed)

## 2018-12-20 NOTE — MAU Note (Addendum)
Pt presents w/lower right abdominal pain during the night, which is gone by morning. Also has nausea and vomiting for 1 week. Feels weak and dehydrated.

## 2018-12-21 ENCOUNTER — Encounter (HOSPITAL_COMMUNITY): Payer: Self-pay | Admitting: *Deleted

## 2018-12-21 ENCOUNTER — Inpatient Hospital Stay (HOSPITAL_COMMUNITY)
Admission: AD | Admit: 2018-12-21 | Discharge: 2018-12-21 | Disposition: A | Discharge status: Home / Self Care | Payer: Medicaid Other | Attending: Obstetrics and Gynecology | Admitting: Obstetrics and Gynecology

## 2018-12-21 ENCOUNTER — Other Ambulatory Visit: Payer: Self-pay

## 2018-12-21 ENCOUNTER — Telehealth: Payer: Self-pay | Admitting: Advanced Practice Midwife

## 2018-12-21 DIAGNOSIS — O3441 Maternal care for other abnormalities of cervix, first trimester: Secondary | ICD-10-CM

## 2018-12-21 DIAGNOSIS — Z3A08 8 weeks gestation of pregnancy: Secondary | ICD-10-CM | POA: Insufficient documentation

## 2018-12-21 DIAGNOSIS — O209 Hemorrhage in early pregnancy, unspecified: Secondary | ICD-10-CM | POA: Diagnosis present

## 2018-12-21 DIAGNOSIS — N841 Polyp of cervix uteri: Secondary | ICD-10-CM | POA: Diagnosis not present

## 2018-12-21 HISTORY — DX: Unspecified infectious disease: B99.9

## 2018-12-21 LAB — CBC
HCT: 33.8 % — ABNORMAL LOW (ref 36.0–46.0)
Hemoglobin: 11.2 g/dL — ABNORMAL LOW (ref 12.0–15.0)
MCH: 27.3 pg (ref 26.0–34.0)
MCHC: 33.1 g/dL (ref 30.0–36.0)
MCV: 82.4 fL (ref 80.0–100.0)
Platelets: 320 10*3/uL (ref 150–400)
RBC: 4.1 MIL/uL (ref 3.87–5.11)
RDW: 13.8 % (ref 11.5–15.5)
WBC: 12 10*3/uL — ABNORMAL HIGH (ref 4.0–10.5)
nRBC: 0 % (ref 0.0–0.2)

## 2018-12-21 MED ORDER — SILVER NITRATE-POT NITRATE 75-25 % EX MISC
CUTANEOUS | Status: AC
  Filled 2018-12-21: qty 1

## 2018-12-21 MED ORDER — SILVER NITRATE-POT NITRATE 75-25 % EX MISC
1.0000 "application " | CUTANEOUS | Status: DC | PRN
  Filled 2018-12-21: qty 1

## 2018-12-21 NOTE — MAU Provider Note (Signed)
Chief Complaint: Vaginal Bleeding   First Provider Initiated Contact with Patient 12/21/18 1505     SUBJECTIVE HPI: Brenda West is a 33 y.o. G1P0000 at 3259w1d who presents to Maternity Admissions reporting heavy vaginal bleeding after using a vibrator this afternoon. Bleeding was scant initially. She called MAU and was advised that she didn't need to come in for light bleeding since she had a known IUP, but bleeding increased and she passed a 4 cm clot so she came to MAU.    Had US 12/13/18 showing live IUP. No other bleeding this pregnancy.   Associated signs and symptoms: Mild cramping.    A POS   Past Medical History:  Diagnosis Date  . Allergy   . Anxiety   . Breast mass   . Bronchitis   . BV (bacterial vaginosis)   . Depression    doing ok  . Eye irritation   . Headache   . Infection    UTI  . Seasonal allergies   . Tooth abscess   . Vaginal yeast infection   . Yeast infection    OB History  Gravida Para Term Preterm AB Living  1 0 0 0 0 0  SAB TAB Ectopic Multiple Live Births  0 0 0 0      # Outcome Date GA Lbr Len/2nd Weight Sex Delivery Anes PTL Lv  1 Current            Past Surgical History:  Procedure Laterality Date  . HIP SURGERY    . WISDOM TOOTH EXTRACTION     Social History   Socioeconomic History  . Marital status: Single    Spouse name: Not on file  . Number of children: Not on file  . Years of education: Not on file  . Highest education level: Not on file  Occupational History  . Not on file  Social Needs  . Financial resource strain: Not on file  . Food insecurity    Worry: Not on file    Inability: Not on file  . Transportation needs    Medical: Not on file    Non-medical: Not on file  Tobacco Use  . Smoking status: Never Smoker  . Smokeless tobacco: Never Used  Substance and Sexual Activity  . Alcohol use: Not Currently    Comment: Occasional  . Drug use: Not Currently    Types: Marijuana    Comment: Last Use beginning of  May 2020  . Sexual activity: Yes    Birth control/protection: None  Lifestyle  . Physical activity    Days per week: Not on file    Minutes per session: Not on file  . Stress: Not on file  Relationships  . Social Musicianconnections    Talks on phone: Not on file    Gets together: Not on file    Attends religious service: Not on file    Active member of club or organization: Not on file    Attends meetings of clubs or organizations: Not on file    Relationship status: Not on file  . Intimate partner violence    Fear of current or ex partner: Not on file    Emotionally abused: Not on file    Physically abused: Not on file    Forced sexual activity: Not on file  Other Topics Concern  . Not on file  Social History Narrative   ** Merged History Encounter **       Family History  Problem  Relation Age of Onset  . Diabetes Father   . Cancer Father   . Hypertension Sister   . Diabetes Sister   . Glaucoma Brother   . Diabetes Brother   . Hypertension Brother    No current facility-administered medications on file prior to encounter.    Current Outpatient Medications on File Prior to Encounter  Medication Sig Dispense Refill  . docusate sodium (COLACE) 100 MG capsule Take 1 capsule (100 mg total) by mouth every 12 (twelve) hours. 60 capsule 0  . ondansetron (ZOFRAN) 8 MG tablet Take 1 tablet (8 mg total) by mouth every 8 (eight) hours as needed for nausea or vomiting. 60 tablet 1  . polyethylene glycol (MIRALAX / GLYCOLAX) 17 g packet Take 17 g by mouth daily. 14 each 1  . Prenatal MV-Min-FA-Omega-3 (PRENATAL GUMMIES/DHA & FA) 0.4-32.5 MG CHEW Chew 1 tablet by mouth daily. 90 tablet 1  . promethazine (PHENERGAN) 25 MG tablet Take 1 tablet (25 mg total) by mouth every 6 (six) hours as needed for nausea or vomiting. 30 tablet 0   No Known Allergies  I have reviewed patient's Past Medical Hx, Surgical Hx, Family Hx, Social Hx, medications and allergies.   Review of Systems   Constitutional: Negative for chills and fever.  Gastrointestinal: Positive for abdominal pain (mild low abd cramping). Negative for anal bleeding and blood in stool.  Genitourinary: Positive for vaginal bleeding. Negative for hematuria, vaginal discharge and vaginal pain.    OBJECTIVE Patient Vitals for the past 24 hrs:  BP Temp Temp src Pulse Resp SpO2  12/21/18 1639 122/67 - - 78 - -  12/21/18 1448 121/80 - - 97 - -  12/21/18 1440 115/71 98.2 F (36.8 C) Oral 92 18 100 %   Constitutional: Well-developed, well-nourished female in no acute distress.  Cardiovascular: normal rate Respiratory: normal rate and effort.  GI: Abd soft, non-tender, gravid appropriate for gestational age. Pos BS x 4 MS: Extremities nontender, no edema, normal ROM Neurologic: Alert and oriented x 4.  GU:  SPECULUM EXAM: NEFG, physiologic discharge, small amount of bright red blood noted, cervix clean, non-friable. 1.5 cm cervical polyp visualized. Small amount of active bleeding. Silver nitrate applied and hemostasis achieved.   BIMANUAL: cervix closed; uterus 8 week-size, no adnexal tenderness or masses. No CMT.  LAB RESULTS NA  IMAGING Informal BS US shows Live IUP. FHR 176. CRL 8.5 weeks  MAU COURSE  Meds ordered this encounter  Medications  . silver nitrate applicators applicator 1 application  . DISCONTD: silver nitrate applicators 75-25 % applicator    Bary RichardHodges, Sally   : cabinet override    MDM - Small amount of bleeding in MAU 2/2 cervical polyp. Improved w/ Silver Nitrate application. Recommend pelvic rest x 1 week and not using a vibrator. Explained that we do not usually remove small polyps in pregnancy, but suggested that she tell her Ob/Gyn about it.   ASSESSMENT 1. Polyp of cervix affecting pregnancy in first trimester   2. First trimester bleeding    PLAN Discharge home in stable condition. Bleeding precautions Pelvic rest x 1 week Follow-up Information    Your OB/GYN Follow up.    Why: Start prenatal care       [facility] Follow up.   Specialty: Obstetrics and Gynecology Why: As needed in pregnancy emergencies Contact information: [address] [id number] [place] [id number] [phone]         Allergies as of 12/21/2018  No Known Allergies     Medication List    TAKE these medications   docusate sodium 100 MG capsule Commonly known as: COLACE Take 1 capsule (100 mg total) by mouth every 12 (twelve) hours.   ondansetron 8 MG tablet Commonly known as: Zofran Take 1 tablet (8 mg total) by mouth every 8 (eight) hours as needed for nausea or vomiting.   polyethylene glycol 17 g packet Commonly known as: MIRALAX / GLYCOLAX Take 17 g by mouth daily.   Prenatal Gummies/DHA & FA 0.4-32.5 MG Chew Chew 1 tablet by mouth daily.   promethazine 25 MG tablet Commonly known as: PHENERGAN Take 1 tablet (25 mg total) by mouth every 6 (six) hours as needed for nausea or vomiting.        Tamala Julian, [place], [place] 12/21/2018  5:35 PM

## 2018-12-21 NOTE — Discharge Instructions (Signed)
What Are Cervical Polyps?  What Are the Symptoms? Cervical polyps are growths on the cervical canal, the passage that connects the uterus to the vagina. Theyre often reddish, purplish, or grayish in color. They may be shaped like a finger, bulb, or thin stem. They can range in size from a few millimeters to several centimeters long.  These bumps inside your cervix are pretty common. Theyre most common in women over age 32 whove given birth to more than one child. Theyre rare in girls who havent started their period.  Most cervical polyps are benign (not cancer).  What Are the Symptoms? About two out of three women who have cervical polyps dont have symptoms. Doctors normally find these growths during a Pap test or other procedure. If you do have symptoms, they may include:  Periods that are heavier than usual Bleeding after sex Bleeding after menopause Bleeding between periods Vaginal discharge, which may stink due to infection What Causes Them? Doctors arent sure, but think they may be linked to:  CONTINUE READING BELOW Cervical infections Chronic inflammation An abnormal response to the hormone estrogen Clogged blood vessels near the cervix How Are They Diagnosed and Treated? If your doctor finds cervical polyps during a routine pelvic exam and Pap smear, shell probably take a sample of the tissue (biopsy) and send it to the lab to make sure its not cancer.  Shell probably remove them at that time. Shell use a tool called a polyp forceps to gently twist the growth off your cervix.  You might bleed and cramp just a little during or after the procedure. An over-the-counter pain medication like acetaminophen (Tylenol) or ibuprofen (Motrin or Advil) can relieve the pain.  If your polyp is large, your doctor may recommend removing it in the operating room using a local or general anesthetic.  Most cervical polyps are benign, cause no problems, and dont come back once  theyre removed.

## 2018-12-21 NOTE — Telephone Encounter (Signed)
Called reporting small amount of bleeding after using a vibrator. Was seen in MAU 12/13/18 and US showed Live IUP. Blood type A pos. Pt denies pain. Bleeding likely from friable cervix. Recommended monitoring for now and pelvic rest x 1 week. Come to MAU for increase in bleeding or onset of abd pain.   Tamala Julian, [place], [place] 12/21/2018 1:57 PM

## 2018-12-21 NOTE — MAU Note (Signed)
Started bleeding after playing around with a vibrator.  Passed a large clot when she got here. Denies pain.

## 2018-12-23 ENCOUNTER — Ambulatory Visit (INDEPENDENT_AMBULATORY_CARE_PROVIDER_SITE_OTHER): Payer: Medicaid Other | Admitting: Family Medicine

## 2018-12-23 ENCOUNTER — Encounter: Payer: Self-pay | Admitting: Family Medicine

## 2018-12-23 ENCOUNTER — Other Ambulatory Visit: Payer: Self-pay

## 2018-12-23 DIAGNOSIS — Z3A08 8 weeks gestation of pregnancy: Secondary | ICD-10-CM

## 2018-12-23 DIAGNOSIS — L708 Other acne: Secondary | ICD-10-CM | POA: Diagnosis not present

## 2018-12-23 DIAGNOSIS — L709 Acne, unspecified: Secondary | ICD-10-CM

## 2018-12-23 DIAGNOSIS — Z331 Pregnant state, incidental: Secondary | ICD-10-CM | POA: Diagnosis not present

## 2018-12-23 DIAGNOSIS — L2089 Other atopic dermatitis: Secondary | ICD-10-CM | POA: Diagnosis not present

## 2018-12-23 DIAGNOSIS — L304 Erythema intertrigo: Secondary | ICD-10-CM | POA: Diagnosis not present

## 2018-12-23 DIAGNOSIS — L209 Atopic dermatitis, unspecified: Secondary | ICD-10-CM

## 2018-12-23 MED ORDER — TRIAMCINOLONE ACETONIDE 0.025 % EX OINT: 1.0000 "application " | TOPICAL_OINTMENT | Freq: Two times a day (BID) | CUTANEOUS | 0 refills | Status: DC

## 2018-12-23 MED ORDER — BENZOYL PEROXIDE 2.75 % EX GEL: CUTANEOUS | 1 refills | Status: DC

## 2018-12-23 MED ORDER — NYSTATIN 100000 UNIT/GM EX CREA: 1.0000 "application " | TOPICAL_CREAM | Freq: Two times a day (BID) | CUTANEOUS | 0 refills | Status: DC

## 2018-12-23 NOTE — Progress Notes (Signed)
Virtual Visit via Telephone Note  I connected with Brenda West on 12/23/18 at  9:30 AM EDT by telephone and verified that I am speaking with the correct person using two identifiers.  Location: Patient: Located at home during today's encounter  Provider: Located at primary care office     I discussed the limitations, risks, security and privacy concerns of performing an evaluation and management service by telephone and the availability of in person appointments. I also discussed with the patient that there may be a patient responsible charge related to this service. The patient expressed understanding and agreed to proceed.   History of Present Illness: Brenda West is present during today's encounter to establish care. She is currently pregnant, 8 weeks, 3 days. She is scheduled to follow-up with OB on 01/13/2019 at [facility].  Atopic Dermatitis and Chronic Acne  Reports several year history of eczema mixed with acne.  She reports that she previously lived in [place] and had excessive sun exposure which resulted in hyperpigmentation as well as scaling patches of the skin. She eventually began cycles of eczema mixed with acne.  Affected areas include areas of face and neck. These exacerbations occur intermittently. She has been treated with topical steroid treatments and topical benzyl oils for acne. She has not been treated recently due to no insurance until recently. She was at 1 point follow-up follow-up in dermatology and was prescribed a topical medication however due to not having insurance she could not afford the out-of-pocket cost of the medication.  She reports responding to triamcinolone temporarily however the skin flares persisted. Triamcinolone helps with itching. She also complains of intertrigo under the breast. Previously treated with nystatin cream which resolved the problem. Requesting a referral dermatology for management of both   Assessment and  Plan: 1. Atopic dermatitis, unspecified type Referral placed to dermatology.  For now trial triamcinolone cream twice daily as needed to affected areas.  Encourage good moisturizing of skin.  Hydrate well with water.  2. Adult acne -Trial benzoyl peroxide gel apply to affected area twice daily. (consider safe during pregnancy-PDR) -Decided against Adalapalene-Benzol combination gel, per PDR, risks associated with use during pregnancy. -Referral placed to dermatology  3. [redacted] weeks gestation of pregnancy -Patient is scheduled to follow-up with OB 01/13/2019  4. Intertriginous dermatitis associated with moisture -Trial nystatin cream beneath the breast twice daily as needed -Including dermatology referral  Follow Up Instructions: As needed   I discussed the assessment and treatment plan with the patient. The patient was provided an opportunity to ask questions and all were answered. The patient agreed with the plan and demonstrated an understanding of the instructions.   The patient was advised to call back or seek an in-person evaluation if the symptoms worsen or if the condition fails to improve as anticipated.  I provided 30 minutes of non-face-to-face time during this encounter.   Brenda Barrows, FNP-C

## 2018-12-26 ENCOUNTER — Telehealth: Payer: Self-pay | Admitting: Nurse Practitioner

## 2018-12-26 NOTE — Telephone Encounter (Signed)
Brenda West 33 y.o. [redacted]w[redacted]d Called Maternity Assessment Unit to see if she could take an enema for constipation.  Is taking stool softener (OTC) daily and it is not helping her constipation.  Advised she could have an enema and to make sure to discuss with her provider at her next visit.  Earlie Server, RN, MSN, NP-BC Nurse Practitioner, [facility] for [employer], [place] [facility] 12/26/2018 7:41 PM

## 2018-12-27 ENCOUNTER — Telehealth: Payer: Self-pay | Admitting: Family Medicine

## 2018-12-27 NOTE — Telephone Encounter (Signed)
Attempted to contact patient. No answer. Will send MyChart message.

## 2018-12-27 NOTE — Telephone Encounter (Signed)
Brenda West,  Please contact patient to advise she is scheduled to follow-up with OB 01/13/19. I unable to prescribed any medications for energy as she is pregnant. Ensure that she is hydrating well, walking, drinking water and taking prenatal vitamin as ways to increase energy.  Any other concerns,she can discuss with her OB provider. Unfortunately, fatigue can be a side effect of pregnancy.    Thanks,  Molli Barrows, FNP

## 2018-12-30 NOTE — Telephone Encounter (Signed)
Called patient & notified her of the information below. Expressed understanding. Cancelled appointment on 01/05/2019.

## 2019-01-04 ENCOUNTER — Ambulatory Visit: Payer: Medicaid Other

## 2019-01-04 DIAGNOSIS — Z3490 Encounter for supervision of normal pregnancy, unspecified, unspecified trimester: Secondary | ICD-10-CM

## 2019-01-04 NOTE — Progress Notes (Signed)
Pt presents for New OB intake. Pt has no concerns today.

## 2019-01-05 ENCOUNTER — Ambulatory Visit: Payer: Medicaid Other | Admitting: Family Medicine

## 2019-01-06 ENCOUNTER — Ambulatory Visit: Payer: Medicaid Other | Admitting: Family Medicine

## 2019-01-06 NOTE — Progress Notes (Signed)
Patient ID: Brenda West, female   DOB: 1986/04/15, 33 y.o.   MRN: [id number] I have reviewed the chart and agree with nursing staff's documentation of this patient's encounter.  Emeterio Reeve, MD 01/06/2019 10:19 AM

## 2019-01-13 ENCOUNTER — Other Ambulatory Visit (HOSPITAL_COMMUNITY)
Admission: RE | Admit: 2019-01-13 | Discharge: 2019-01-13 | Disposition: A | Discharge status: Home / Self Care | Payer: Medicaid Other | Source: Ambulatory Visit | Attending: Family Medicine | Admitting: Family Medicine

## 2019-01-13 ENCOUNTER — Ambulatory Visit (INDEPENDENT_AMBULATORY_CARE_PROVIDER_SITE_OTHER): Payer: Medicaid Other | Admitting: Family Medicine

## 2019-01-13 ENCOUNTER — Other Ambulatory Visit: Payer: Self-pay

## 2019-01-13 ENCOUNTER — Encounter: Payer: Self-pay | Admitting: Family Medicine

## 2019-01-13 VITALS — BP 125/76 | HR 96 | Wt 194.3 lb

## 2019-01-13 DIAGNOSIS — Z3490 Encounter for supervision of normal pregnancy, unspecified, unspecified trimester: Secondary | ICD-10-CM

## 2019-01-13 DIAGNOSIS — Z3A11 11 weeks gestation of pregnancy: Secondary | ICD-10-CM | POA: Diagnosis not present

## 2019-01-13 DIAGNOSIS — Z3491 Encounter for supervision of normal pregnancy, unspecified, first trimester: Secondary | ICD-10-CM

## 2019-01-13 DIAGNOSIS — Z3481 Encounter for supervision of other normal pregnancy, first trimester: Secondary | ICD-10-CM

## 2019-01-13 MED ORDER — BLOOD PRESSURE MONITORING KIT: 1.0000 | PACK | 0 refills | Status: AC

## 2019-01-13 MED ORDER — BLOOD PRESSURE MONITORING KIT: 1.0000 | PACK | 0 refills | Status: DC

## 2019-01-13 NOTE — Progress Notes (Signed)
NOB is in the office, initial OB intake completed on 01-04-19. Pt denies pain today, reports vaginal discharge. Faxed BP cuff rx.

## 2019-01-13 NOTE — Progress Notes (Signed)
Subjective:   Brenda West is a 33 y.o. G1P0000 at 30w3dby LMP/7 being seen today for her first obstetrical visit.  No significant PMH. Patient does intend to breast feed. Pregnancy history fully reviewed.  Patient reports feeling stressed. Has also been having some trouble with excessive spitting. She is having trouble finding a job, feels like being discriminated against because she is telling people she is pregnant. Not currently involved with FOB. Lives with older man who is in his 742s Has lived with him for last several years, states used to be in a relationship but now just friends.   HISTORY: OB History  Gravida Para Term Preterm AB Living  1 0 0 0 0 0  SAB TAB Ectopic Multiple Live Births  0 0 0 0 0    # Outcome Date GA Lbr Len/2nd Weight Sex Delivery Anes PTL Lv  1 Current            Last pap smear was  10/2012 and was normal Past Medical History:  Diagnosis Date  . Allergy   . Anxiety   . Breast mass   . Bronchitis   . BV (bacterial vaginosis)   . Depression    doing ok  . Eye irritation   . Headache   . Infection    UTI  . Seasonal allergies   . Tooth abscess   . Vaginal yeast infection   . Yeast infection    Past Surgical History:  Procedure Laterality Date  . HIP SURGERY    . WISDOM TOOTH EXTRACTION     Family History  Problem Relation Age of Onset  . Diabetes Father   . Cancer Father   . Hypertension Sister   . Diabetes Sister   . Glaucoma Brother   . Diabetes Brother   . Hypertension Brother    Social History   Tobacco Use  . Smoking status: Never Smoker  . Smokeless tobacco: Never Used  Substance Use Topics  . Alcohol use: Not Currently    Comment: Occasional  . Drug use: Not Currently    Types: Marijuana    Comment: Last Use beginning of May 2020   No Known Allergies Current Outpatient Medications on File Prior to Visit  Medication Sig Dispense Refill  . ondansetron (ZOFRAN) 8 MG tablet Take 1 tablet (8 mg total) by mouth  every 8 (eight) hours as needed for nausea or vomiting. 60 tablet 1  . Prenatal MV-Min-FA-Omega-3 (PRENATAL GUMMIES/DHA & FA) 0.4-32.5 MG CHEW Chew 1 tablet by mouth daily. 90 tablet 1  . promethazine (PHENERGAN) 25 MG tablet Take 1 tablet (25 mg total) by mouth every 6 (six) hours as needed for nausea or vomiting. 30 tablet 0  . triamcinolone (KENALOG) 0.025 % ointment Apply 1 application topically 2 (two) times daily. 454 g 0  . Benzoyl Peroxide 2.75 % GEL Apply to affected areas of skin twice daily as needed (Patient not taking: Reported on 01/04/2019) 100 g 1  . docusate sodium (COLACE) 100 MG capsule Take 1 capsule (100 mg total) by mouth every 12 (twelve) hours. (Patient not taking: Reported on 01/04/2019) 60 capsule 0  . nystatin cream (MYCOSTATIN) Apply 1 application topically 2 (two) times daily. (Patient not taking: Reported on 01/04/2019) 90 g 0  . polyethylene glycol (MIRALAX / GLYCOLAX) 17 g packet Take 17 g by mouth daily. (Patient not taking: Reported on 01/04/2019) 14 each 1   No current facility-administered medications on file prior to visit.  Exam   Vitals:   01/13/19 1028  BP: 125/76  Pulse: 96  Weight: 194 lb 4.8 oz (88.1 kg)   Fetal Heart Rate (bpm): 163  Pelvic Exam: Perineum: no hemorrhoids, normal perineum   Vulva: normal external genitalia, no lesions   Vagina:  normal mucosa, normal discharge   Cervix: no lesions and normal, pap smear done.    Adnexa: normal adnexa and no mass, fullness, tenderness   Bony Pelvis: average  System: General: well-developed, well-nourished female in no acute distress   Breast:  normal appearance, no masses or tenderness   Skin: normal coloration and turgor, no rashes   Neurologic: oriented, normal, negative, normal mood   Extremities: normal strength, tone, and muscle mass, ROM of all joints is normal   HEENT PERRL, extraocular movement intact and sclera clear, anicteric   Mouth/Teeth mucous membranes moist, pharynx normal  without lesions and dental hygiene good   Neck supple and no masses   Cardiovascular: Regular rate   Respiratory:  no respiratory distress   Abdomen: soft, non-tender, no masses,  no organomegaly     Assessment:   Pregnancy: G1P0000 Patient Active Problem List   Diagnosis Date Noted  . Encounter for supervision of normal pregnancy, unspecified, unspecified trimester 01/04/2019     Plan:  1. Encounter for supervision of normal pregnancy, antepartum, unspecified gravidity - Cytology - PAP( [place]) - Cervicovaginal ancillary only( [place]) - Culture, OB Urine - Obstetric Panel, Including HIV - Genetic Screening - Blood Pressure Monitoring KIT; 1 kit by Does not apply route once a week.  Dispense: 1 kit; Refill: 0 - Initial labs drawn. - Continue prenatal vitamins. - Genetic Screening discussed, NIPS: requested. - Ultrasound discussed; fetal anatomic survey: requested. - Problem list reviewed and updated. - The nature of [facility] [place] with multiple physicians and other Advanced Practice Providers was explained to patient; also emphasized that residents, students are part of our team. - Routine obstetric precautions reviewed.  Lambert Mody. Juleen China, DO OB/GYN Fellow

## 2019-01-13 NOTE — Patient Instructions (Signed)
First Trimester of Pregnancy ° °The first trimester of pregnancy is from week 1 until the end of week 13 (months 1 through 3). During this time, your baby will begin to develop inside you. At 6-8 weeks, the eyes and face are formed, and the heartbeat can be seen on ultrasound. At the end of 12 weeks, all the baby's organs are formed. Prenatal care is all the medical care you receive before the birth of your baby. Make sure you get good prenatal care and follow all of your doctor's instructions. °Follow these instructions at home: °Medicines °· Take over-the-counter and prescription medicines only as told by your doctor. Some medicines are safe and some medicines are not safe during pregnancy. °· Take a prenatal vitamin that contains at least 600 micrograms (mcg) of folic acid. °· If you have trouble pooping (constipation), take medicine that will make your stool soft (stool softener) if your doctor approves. °Eating and drinking ° °· Eat regular, healthy meals. °· Your doctor will tell you the amount of weight gain that is right for you. °· Avoid raw meat and uncooked cheese. °· If you feel sick to your stomach (nauseous) or throw up (vomit): °? Eat 4 or 5 small meals a day instead of 3 large meals. °? Try eating a few soda crackers. °? Drink liquids between meals instead of during meals. °· To prevent constipation: °? Eat foods that are high in fiber, like fresh fruits and vegetables, whole grains, and beans. °? Drink enough fluids to keep your pee (urine) clear or pale yellow. °Activity °· Exercise only as told by your doctor. Stop exercising if you have cramps or pain in your lower belly (abdomen) or low back. °· Do not exercise if it is too hot, too humid, or if you are in a place of great height (high altitude). °· Try to avoid standing for long periods of time. Move your legs often if you must stand in one place for a long time. °· Avoid heavy lifting. °· Wear low-heeled shoes. Sit and stand up  straight. °· You can have sex unless your doctor tells you not to. °Relieving pain and discomfort °· Wear a good support bra if your breasts are sore. °· Take warm water baths (sitz baths) to soothe pain or discomfort caused by hemorrhoids. Use hemorrhoid cream if your doctor says it is okay. °· Rest with your legs raised if you have leg cramps or low back pain. °· If you have puffy, bulging veins (varicose veins) in your legs: °? Wear support hose or compression stockings as told by your doctor. °? Raise (elevate) your feet for 15 minutes, 3-4 times a day. °? Limit salt in your food. °Prenatal care °· Schedule your prenatal visits by the twelfth week of pregnancy. °· Write down your questions. Take them to your prenatal visits. °· Keep all your prenatal visits as told by your doctor. This is important. °Safety °· Wear your seat belt at all times when driving. °· Make a list of emergency phone numbers. The list should include numbers for family, friends, the hospital, and police and fire departments. °General instructions °· Ask your doctor for a referral to a local prenatal class. Begin classes no later than at the start of month 6 of your pregnancy. °· Ask for help if you need counseling or if you need help with nutrition. Your doctor can give you advice or tell you where to go for help. °· Do not use hot tubs, steam   rooms, or saunas. °· Do not douche or use tampons or scented sanitary pads. °· Do not cross your legs for long periods of time. °· Avoid all herbs and alcohol. Avoid drugs that are not approved by your doctor. °· Do not use any tobacco products, including cigarettes, chewing tobacco, and electronic cigarettes. If you need help quitting, ask your doctor. You may get counseling or other support to help you quit. °· Avoid cat litter boxes and soil used by cats. These carry germs that can cause birth defects in the baby and can cause a loss of your baby (miscarriage) or stillbirth. °· Visit your dentist.  At home, brush your teeth with a soft toothbrush. Be gentle when you floss. °Contact a doctor if: °· You are dizzy. °· You have mild cramps or pressure in your lower belly. °· You have a nagging pain in your belly area. °· You continue to feel sick to your stomach, you throw up, or you have watery poop (diarrhea). °· You have a bad smelling fluid coming from your vagina. °· You have pain when you pee (urinate). °· You have increased puffiness (swelling) in your face, hands, legs, or ankles. °Get help right away if: °· You have a fever. °· You are leaking fluid from your vagina. °· You have spotting or bleeding from your vagina. °· You have very bad belly cramping or pain. °· You gain or lose weight rapidly. °· You throw up blood. It may look like coffee grounds. °· You are around people who have German measles, fifth disease, or chickenpox. °· You have a very bad headache. °· You have shortness of breath. °· You have any kind of trauma, such as from a fall or a car accident. °Summary °· The first trimester of pregnancy is from week 1 until the end of week 13 (months 1 through 3). °· To take care of yourself and your unborn baby, you will need to eat healthy meals, take medicines only if your doctor tells you to do so, and do activities that are safe for you and your baby. °· Keep all follow-up visits as told by your doctor. This is important as your doctor will have to ensure that your baby is healthy and growing well. °This information is not intended to replace advice given to you by your health care provider. Make sure you discuss any questions you have with your health care provider. °Document Released: 12/10/2007 Document Revised: 10/14/2018 Document Reviewed: 07/01/2016 °Elsevier Patient Education © 2020 [employer] ° °

## 2019-01-14 LAB — OBSTETRIC PANEL, INCLUDING HIV
Antibody Screen: NEGATIVE
Basophils Absolute: 0 10*3/uL (ref 0.0–0.2)
Basos: 0 %
EOS (ABSOLUTE): 0.1 10*3/uL (ref 0.0–0.4)
Eos: 1 %
HIV Screen 4th Generation wRfx: NONREACTIVE
Hematocrit: 36.4 % (ref 34.0–46.6)
Hemoglobin: 11.8 g/dL (ref 11.1–15.9)
Hepatitis B Surface Ag: NEGATIVE
Immature Grans (Abs): 0.1 10*3/uL (ref 0.0–0.1)
Immature Granulocytes: 1 %
Lymphocytes Absolute: 2.9 10*3/uL (ref 0.7–3.1)
Lymphs: 26 %
MCH: 26.1 pg — ABNORMAL LOW (ref 26.6–33.0)
MCHC: 32.4 g/dL (ref 31.5–35.7)
MCV: 81 fL (ref 79–97)
Monocytes Absolute: 0.8 10*3/uL (ref 0.1–0.9)
Monocytes: 7 %
Neutrophils Absolute: 7.3 10*3/uL — ABNORMAL HIGH (ref 1.4–7.0)
Neutrophils: 65 %
Platelets: 357 10*3/uL (ref 150–450)
RBC: 4.52 x10E6/uL (ref 3.77–5.28)
RDW: 14 % (ref 11.7–15.4)
RPR Ser Ql: NONREACTIVE
Rh Factor: POSITIVE
Rubella Antibodies, IGG: 10.2 index (ref >0.99)
WBC: 11.1 10*3/uL — ABNORMAL HIGH (ref 3.4–10.8)

## 2019-01-14 LAB — CERVICOVAGINAL ANCILLARY ONLY
Bacterial vaginitis: NEGATIVE
Candida vaginitis: NEGATIVE
Chlamydia: NEGATIVE
Neisseria Gonorrhea: NEGATIVE
Trichomonas: NEGATIVE

## 2019-01-15 LAB — CULTURE, OB URINE

## 2019-01-15 LAB — URINE CULTURE, OB REFLEX

## 2019-01-17 ENCOUNTER — Telehealth: Payer: Self-pay | Admitting: *Deleted

## 2019-01-17 NOTE — Telephone Encounter (Signed)
Pt called to office stating that she was to get Rx for Metrogel and it was not sent to pharmacy. Pt made aware that her results of vaginal swab were negative, no need for Rx. Pt made aware that she may have changes in her discharge throughout her pregnancy and this is normal. Pt advised to contact office if any abnormal changes to her d/c.  Pt states understanding.

## 2019-01-19 LAB — CYTOLOGY - PAP
HPV 16/18/45 genotyping: NEGATIVE
HPV: DETECTED — AB

## 2019-01-24 ENCOUNTER — Other Ambulatory Visit: Payer: Self-pay

## 2019-01-24 ENCOUNTER — Encounter: Payer: Self-pay | Admitting: Family Medicine

## 2019-01-24 ENCOUNTER — Inpatient Hospital Stay (HOSPITAL_COMMUNITY)
Admission: AD | Admit: 2019-01-24 | Discharge: 2019-01-25 | Disposition: A | Discharge status: Home / Self Care | Payer: Medicaid Other | Attending: Obstetrics and Gynecology | Admitting: Obstetrics and Gynecology

## 2019-01-24 ENCOUNTER — Encounter (HOSPITAL_COMMUNITY): Payer: Self-pay | Admitting: *Deleted

## 2019-01-24 DIAGNOSIS — O210 Mild hyperemesis gravidarum: Secondary | ICD-10-CM | POA: Insufficient documentation

## 2019-01-24 DIAGNOSIS — K219 Gastro-esophageal reflux disease without esophagitis: Secondary | ICD-10-CM

## 2019-01-24 DIAGNOSIS — O99611 Diseases of the digestive system complicating pregnancy, first trimester: Secondary | ICD-10-CM | POA: Insufficient documentation

## 2019-01-24 DIAGNOSIS — Z3A13 13 weeks gestation of pregnancy: Secondary | ICD-10-CM | POA: Insufficient documentation

## 2019-01-24 DIAGNOSIS — K117 Disturbances of salivary secretion: Secondary | ICD-10-CM

## 2019-01-24 DIAGNOSIS — Z79899 Other long term (current) drug therapy: Secondary | ICD-10-CM | POA: Insufficient documentation

## 2019-01-24 NOTE — MAU Note (Addendum)
Pt reports to MAU c/o chest burning/aching feeling. Pt reports a HA earlier today that has resolved today. Pt reports sore throat but thinks it is due to allergies. Pt reports dry eyes. No bleeding or LOF. Pt reports occasional abdominal pain that she feels when she lays on a certain side. Pt reports vomiting x1 times today and spitting. Pt reports some constipation. Pt reports having a musty smell in her "butt crack" even after showers.

## 2019-01-25 ENCOUNTER — Encounter: Payer: Self-pay | Admitting: Family Medicine

## 2019-01-25 DIAGNOSIS — Z3A13 13 weeks gestation of pregnancy: Secondary | ICD-10-CM

## 2019-01-25 DIAGNOSIS — Z79899 Other long term (current) drug therapy: Secondary | ICD-10-CM | POA: Diagnosis not present

## 2019-01-25 DIAGNOSIS — O99611 Diseases of the digestive system complicating pregnancy, first trimester: Secondary | ICD-10-CM | POA: Diagnosis not present

## 2019-01-25 DIAGNOSIS — O26891 Other specified pregnancy related conditions, first trimester: Secondary | ICD-10-CM | POA: Diagnosis not present

## 2019-01-25 DIAGNOSIS — K219 Gastro-esophageal reflux disease without esophagitis: Secondary | ICD-10-CM | POA: Diagnosis not present

## 2019-01-25 DIAGNOSIS — O210 Mild hyperemesis gravidarum: Secondary | ICD-10-CM | POA: Diagnosis not present

## 2019-01-25 LAB — URINALYSIS, ROUTINE W REFLEX MICROSCOPIC
Bilirubin Urine: NEGATIVE
Glucose, UA: 50 mg/dL — AB
Ketones, ur: NEGATIVE mg/dL
Leukocytes,Ua: NEGATIVE
Nitrite: NEGATIVE
Protein, ur: NEGATIVE mg/dL
Specific Gravity, Urine: 1.017 (ref 1.005–1.030)
pH: 6 (ref 5.0–8.0)

## 2019-01-25 MED ORDER — FAMOTIDINE 20 MG PO TABS: 20.0000 mg | ORAL_TABLET | Freq: Every day | ORAL | 2 refills | Status: DC

## 2019-01-25 MED ORDER — FAMOTIDINE 20 MG PO TABS
10.0000 mg | ORAL_TABLET | Freq: Once | ORAL | Status: AC
  Administered 2019-01-25: 10 mg via ORAL
  Filled 2019-01-25: qty 1

## 2019-01-25 MED ORDER — SCOPOLAMINE 1 MG/3DAYS TD PT72: 1.0000 | MEDICATED_PATCH | TRANSDERMAL | 12 refills | Status: DC

## 2019-01-25 MED ORDER — ONDANSETRON 4 MG PO TBDP
8.0000 mg | ORAL_TABLET | Freq: Three times a day (TID) | ORAL | Status: DC | PRN
  Administered 2019-01-25: 8 mg via ORAL
  Filled 2019-01-25: qty 2

## 2019-01-25 MED ORDER — GLYCOPYRROLATE 1 MG PO TABS
1.0000 mg | ORAL_TABLET | Freq: Three times a day (TID) | ORAL | Status: DC
  Administered 2019-01-25: 1 mg via ORAL
  Filled 2019-01-25: qty 1

## 2019-01-25 MED ORDER — GLYCOPYRROLATE 1 MG PO TABS: 1.0000 mg | ORAL_TABLET | Freq: Three times a day (TID) | ORAL | 1 refills | Status: DC

## 2019-01-25 MED ORDER — ONDANSETRON 8 MG PO TBDP: 8.0000 mg | ORAL_TABLET | Freq: Three times a day (TID) | ORAL | 0 refills | Status: DC | PRN

## 2019-01-25 NOTE — MAU Note (Signed)
Per Julianne Handler CNM [facility] 158.

## 2019-01-25 NOTE — MAU Provider Note (Addendum)
History     CSN: [id number]  Arrival date and time: 01/24/19 2309   First Provider Initiated Contact with Patient 01/25/19 0006      Chief Complaint  Patient presents with  . Heartburn  . Nausea   33 y.o. G1 '@13' .1 wks presenting with worsening N/V. Reports unable to keep her food down today. Having worsening reflux, burning in chest and throat. Reports spitting saliva into a cup all day long. Denies VB or abd pain. She uses Phenergan or Zofran but didn't take any today. No diarrhea. No sick contacts.   OB History    Gravida  1   Para  0   Term  0   Preterm  0   AB  0   Living  0     SAB  0   TAB  0   Ectopic  0   Multiple  0   Live Births              Past Medical History:  Diagnosis Date  . Allergy   . Anxiety   . Breast mass   . Bronchitis   . BV (bacterial vaginosis)   . Depression    doing ok  . Eye irritation   . Headache   . Infection    UTI  . Seasonal allergies   . Tooth abscess   . Vaginal yeast infection   . Yeast infection     Past Surgical History:  Procedure Laterality Date  . HIP SURGERY    . WISDOM TOOTH EXTRACTION      Family History  Problem Relation Age of Onset  . Diabetes Father   . Cancer Father   . Hypertension Sister   . Diabetes Sister   . Glaucoma Brother   . Diabetes Brother   . Hypertension Brother     Social History   Tobacco Use  . Smoking status: Never Smoker  . Smokeless tobacco: Never Used  Substance Use Topics  . Alcohol use: Not Currently    Comment: Occasional  . Drug use: Not Currently    Types: Marijuana    Comment: Last Use beginning of May 2020    Allergies: No Known Allergies  Medications Prior to Admission  Medication Sig Dispense Refill Last Dose  . Blood Pressure Monitoring KIT 1 kit by Does not apply route once a week. 1 kit 0 01/24/2019 at Unknown time  . calcium carbonate (TUMS - DOSED IN MG ELEMENTAL CALCIUM) 500 MG chewable tablet Chew 1 tablet by mouth daily.   01/24/2019  at Unknown time  . ondansetron (ZOFRAN) 8 MG tablet Take 1 tablet (8 mg total) by mouth every 8 (eight) hours as needed for nausea or vomiting. 60 tablet 1 01/23/2019 at Unknown time  . Prenatal MV-Min-FA-Omega-3 (PRENATAL GUMMIES/DHA & FA) 0.4-32.5 MG CHEW Chew 1 tablet by mouth daily. 90 tablet 1 01/24/2019 at Unknown time  . promethazine (PHENERGAN) 25 MG tablet Take 1 tablet (25 mg total) by mouth every 6 (six) hours as needed for nausea or vomiting. 30 tablet 0 Past Week at Unknown time  . triamcinolone (KENALOG) 0.025 % ointment Apply 1 application topically 2 (two) times daily. 454 g 0 01/24/2019 at Unknown time    Review of Systems  Constitutional: Negative for chills and fever.  Gastrointestinal: Positive for constipation, nausea and vomiting. Negative for abdominal pain and diarrhea.  Genitourinary: Negative for vaginal bleeding.   Physical Exam   Blood pressure 122/77, pulse 83, temperature 98.6 F (37  C), temperature source Oral, resp. rate 19, last menstrual period 10/25/2018.  Physical Exam  Nursing note and vitals reviewed. Constitutional: She is oriented to person, place, and time. She appears well-developed and well-nourished. No distress.  HENT:  Head: Normocephalic and atraumatic.  Neck: Normal range of motion.  Cardiovascular: Normal rate.  Respiratory: Effort normal. No respiratory distress.  GI: Soft. She exhibits no distension and no mass. There is no abdominal tenderness. There is no rebound and no guarding.  Musculoskeletal: Normal range of motion.  Neurological: She is alert and oriented to person, place, and time.  Skin: Skin is warm and dry.  Psychiatric: She has a normal mood and affect.  Unable to obtain FHR by doppler Limited bedside US: viable, active fetus, FHR 156, subj. nml AFV  Results for orders placed or performed during the hospital encounter of 01/24/19 (from the past 24 hour(s))  Urinalysis, Routine w reflex microscopic     Status: Abnormal    Collection Time: 01/25/19 12:19 AM  Result Value Ref Range   Color, Urine YELLOW YELLOW   APPearance CLOUDY (A) CLEAR   Specific Gravity, Urine 1.017 1.005 - 1.030   pH 6.0 5.0 - 8.0   Glucose, UA 50 (A) NEGATIVE mg/dL   Hgb urine dipstick MODERATE (A) NEGATIVE   Bilirubin Urine NEGATIVE NEGATIVE   Ketones, ur NEGATIVE NEGATIVE mg/dL   Protein, ur NEGATIVE NEGATIVE mg/dL   Nitrite NEGATIVE NEGATIVE   Leukocytes,Ua NEGATIVE NEGATIVE   RBC / HPF 0-5 0 - 5 RBC/hpf   WBC, UA 0-5 0 - 5 WBC/hpf   Bacteria, UA FEW (A) NONE SEEN   Squamous Epithelial / LPF 0-5 0 - 5   Mucus PRESENT    MAU Course  Procedures Orders Placed This Encounter  Procedures  . Urinalysis, Routine w reflex microscopic    Standing Status:   Standing    Number of Occurrences:   1  . Discharge patient    Order Specific Question:   Discharge disposition    Answer:   01-Home or Self Care [1]    Order Specific Question:   Discharge patient date    Answer:   01/25/2019   Meds ordered this encounter  Medications  . ondansetron (ZOFRAN-ODT) disintegrating tablet 8 mg  . glycopyrrolate (ROBINUL) tablet 1 mg  . famotidine (PEPCID) tablet 10 mg  . glycopyrrolate (ROBINUL) 1 MG tablet    Sig: Take 1 tablet (1 mg total) by mouth 3 (three) times daily. For spitting    Dispense:  90 tablet    Refill:  1    Order Specific Question:   Supervising Provider    Answer:   ERVIN, MICHAEL L [[id number]]  . scopolamine (TRANSDERM-SCOP, 1.5 MG,) 1 MG/3DAYS    Sig: Place 1 patch (1.5 mg total) onto the skin every 3 (three) days.    Dispense:  10 patch    Refill:  12    Order Specific Question:   Supervising Provider    Answer:   Rip Harbour, MICHAEL L [[id number]]  . ondansetron (ZOFRAN-ODT) 8 MG disintegrating tablet    Sig: Take 1 tablet (8 mg total) by mouth every 8 (eight) hours as needed for nausea or vomiting.    Dispense:  30 tablet    Refill:  0    Order Specific Question:   Supervising Provider    Answer:   ERVIN, MICHAEL L [[id number]]  .  famotidine (PEPCID) 20 MG tablet    Sig: Take 1 tablet (20 mg total) by  mouth at bedtime.    Dispense:  30 tablet    Refill:  2    Order Specific Question:   Supervising Provider    Answer:   Rip Harbour, MICHAEL L [[id number]]   MDM Labs ordered and reviewed. Feeling better after meds. Tolerating po liquids. GERD improved. Stable for discharge home.   Assessment and Plan   1. [redacted] weeks gestation of pregnancy   2. Morning sickness   3. Ptyalism   4. Gastroesophageal reflux disease without esophagitis    Discharge home Follow up at [facility] as scheduled Maintain hydration Rx Pepcid Rx Robinul Rx Zofran Rx Scopolamine  Allergies as of 01/25/2019   No Known Allergies     Medication List    TAKE these medications   Blood Pressure Monitoring Kit 1 kit by Does not apply route once a week.   calcium carbonate 500 MG chewable tablet Commonly known as: TUMS - dosed in mg elemental calcium Chew 1 tablet by mouth daily.   famotidine 20 MG tablet Commonly known as: PEPCID Take 1 tablet (20 mg total) by mouth at bedtime.   glycopyrrolate 1 MG tablet Commonly known as: ROBINUL Take 1 tablet (1 mg total) by mouth 3 (three) times daily. For spitting   ondansetron 8 MG disintegrating tablet Commonly known as: ZOFRAN-ODT Take 1 tablet (8 mg total) by mouth every 8 (eight) hours as needed for nausea or vomiting.   ondansetron 8 MG tablet Commonly known as: Zofran Take 1 tablet (8 mg total) by mouth every 8 (eight) hours as needed for nausea or vomiting.   Prenatal Gummies/DHA & FA 0.4-32.5 MG Chew Chew 1 tablet by mouth daily.   promethazine 25 MG tablet Commonly known as: PHENERGAN Take 1 tablet (25 mg total) by mouth every 6 (six) hours as needed for nausea or vomiting.   scopolamine 1 MG/3DAYS Commonly known as: Transderm-Scop (1.5 MG) Place 1 patch (1.5 mg total) onto the skin every 3 (three) days.   triamcinolone 0.025 % ointment Commonly known as: KENALOG Apply 1 application  topically 2 (two) times daily.      Julianne Handler, CNM 01/25/2019, 2:11 AM

## 2019-01-25 NOTE — Discharge Instructions (Signed)
Morning Sickness  Morning sickness is when you feel sick to your stomach (nauseous) during pregnancy. You may feel sick to your stomach and throw up (vomit). You may feel sick in the morning, but you can feel this way at any time of day. Some women feel very sick to their stomach and cannot stop throwing up (hyperemesis gravidarum). Follow these instructions at home: Medicines  Take over-the-counter and prescription medicines only as told by your doctor. Do not take any medicines until you talk with your doctor about them first.  Taking multivitamins before getting pregnant can stop or lessen the harshness of morning sickness. Eating and drinking  Eat dry toast or crackers before getting out of bed.  Eat 5 or 6 small meals a day.  Eat dry and bland foods like rice and baked potatoes.  Do not eat greasy, fatty, or spicy foods.  Have someone cook for you if the smell of food causes you to feel sick or throw up.  If you feel sick to your stomach after taking prenatal vitamins, take them at night or with a snack.  Eat protein when you need a snack. Nuts, yogurt, and cheese are good choices.  Drink fluids throughout the day.  Try ginger ale made with real ginger, ginger tea made from fresh grated ginger, or ginger candies. General instructions  Do not use any products that have nicotine or tobacco in them, such as cigarettes and e-cigarettes. If you need help quitting, ask your doctor.  Use an air purifier to keep the air in your house free of smells.  Get lots of fresh air.  Try to avoid smells that make you feel sick.  Try: ? Wearing a bracelet that is used for seasickness (acupressure wristband). ? Going to a doctor who puts thin needles into certain body points (acupuncture) to improve how you feel. Contact a doctor if:  You need medicine to feel better.  You feel dizzy or light-headed.  You are losing weight. Get help right away if:  You feel very sick to your  stomach and cannot stop throwing up.  You pass out (faint).  You have very bad pain in your belly. Summary  Morning sickness is when you feel sick to your stomach (nauseous) during pregnancy.  You may feel sick in the morning, but you can feel this way at any time of day.  Making some changes to what you eat may help your symptoms go away. This information is not intended to replace advice given to you by your health care provider. Make sure you discuss any questions you have with your health care provider. Document Released: 07/31/2004 Document Revised: 06/05/2017 Document Reviewed: 07/24/2016 Elsevier Patient Education  2020 [employer] Inc[employer]   Heartburn During Pregnancy  Heartburn is pain or discomfort in the throat or chest. It may cause a burning feeling. It happens when stomach acid moves up into the tube that carries food from your mouth to your stomach (esophagus). Heartburn is common during pregnancy. It usually goes away or gets better after giving birth. Follow these instructions at home: Eating and drinking  Do not drink alcohol while you are pregnant.  Figure out which foods and beverages make you feel worse, and avoid them.  Beverages that you may want to avoid include: ? Coffee and tea (with or without caffeine). ? Energy drinks and sports drinks. ? Bubbly (carbonated) drinks or sodas. ? Citrus fruit juices.  Foods that you may want to avoid include: ? Chocolate and  cocoa. ? Peppermint and mint flavorings. ? Garlic, onions, and horseradish. ? Spicy and acidic foods. These include peppers, chili powder, curry powder, vinegar, hot sauces, and barbecue sauce. ? Citrus fruits, such as oranges, lemons, and limes. ? Tomato-based foods, such as red sauce, chili, and salsa. ? Fried and fatty foods, such as donuts, french fries, potato chips, and high-fat dressings. ? High-fat meats, such as hot dogs, cold cuts, sausage, ham, and bacon. ? High-fat dairy items, such as  whole milk, butter, and cheese.  Eat small meals often, instead of large meals.  Avoid drinking a lot of liquid with your meals.  Avoid eating meals during the 2-3 hours before you go to bed.  Avoid lying down right after you eat.  Do not exercise right after you eat. Medicines  Take over-the-counter and prescription medicines only as told by your doctor.  Do not take aspirin, ibuprofen, or other NSAIDs unless your doctor tells you to do that.  Your doctor may tell you to avoid medicines that have sodium bicarbonate in them. General instructions   If told, raise the head of your bed about 6 inches (15 cm). You can do this by putting blocks under the legs. Sleeping with more pillows does not help with heartburn.  Do not use any products that contain nicotine or tobacco, such as cigarettes and e-cigarettes. If you need help quitting, ask your doctor.  Wear loose-fitting clothing.  Try to lower your stress, such as with yoga or meditation. If you need help, ask your doctor.  Stay at a healthy weight. If you are overweight, work with your doctor to safely lose weight.  Keep all follow-up visits as told by your doctor. This is important. Contact a doctor if:  You get new symptoms.  Your symptoms do not get better with treatment.  You have weight loss and you do not know why.  You have trouble swallowing.  You make loud sounds when you breathe (wheeze).  You have a cough that does not go away.  You have heartburn often for more than 2 weeks.  You feel sick to your stomach (nauseous), and this does not get better with treatment.  You are throwing up (vomiting), and this does not get better with treatment.  You have pain in your belly (abdomen). Get help right away if:  You have very bad chest pain that spreads to your arm, neck, or jaw.  You feel sweaty, dizzy, or light-headed.  You have trouble breathing.  You have pain when swallowing.  You throw up and your  throw-up looks like blood or coffee grounds.  Your poop (stool) is bloody or black. This information is not intended to replace advice given to you by your health care provider. Make sure you discuss any questions you have with your health care provider. Document Released: 07/26/2010 Document Revised: 10/14/2018 Document Reviewed: 03/10/2016 Elsevier Patient Education  2020 [employer].

## 2019-01-26 ENCOUNTER — Encounter: Payer: Self-pay | Admitting: Family Medicine

## 2019-01-26 DIAGNOSIS — R87612 Low grade squamous intraepithelial lesion on cytologic smear of cervix (LGSIL): Secondary | ICD-10-CM | POA: Insufficient documentation

## 2019-02-08 ENCOUNTER — Telehealth: Payer: Self-pay | Admitting: Obstetrics & Gynecology

## 2019-02-08 NOTE — Telephone Encounter (Signed)
I called and could not leave a message I left her a Mychart message to have her call the office about her BP. Baby scripts had called me about an elevated BP.

## 2019-02-13 ENCOUNTER — Other Ambulatory Visit: Payer: Self-pay

## 2019-02-13 ENCOUNTER — Inpatient Hospital Stay (HOSPITAL_COMMUNITY)
Admit: 2019-02-13 | Discharge: 2019-02-13 | Discharge status: Against Medical Advice | Payer: Medicaid Other | Source: Home / Self Care | Attending: Obstetrics and Gynecology | Admitting: Obstetrics and Gynecology

## 2019-02-13 NOTE — MAU Note (Signed)
Brenda West is a 33 y.o. at [redacted]w[redacted]d here in MAU reporting: used an enema yesterday for constipation, states usually when she uses them she feels better but she is having abdominal pain. No vaginal bleeding. Having some SOB, occasionaly for 2-3 years.  Onset of complaint: yesterday  Pain score: 10/10  Vitals:   02/13/19 1538  BP: 109/62  Pulse: 88  Resp: 18  Temp: 98.8 F (37.1 C)  SpO2: 100%     FHT:150  Lab orders placed from triage: UA, unable to give sample at this time

## 2019-02-13 NOTE — MAU Note (Signed)
Pt states she does not want to wait anymore, AMA paper signed, this RN witnessess, Drake Leach CNM made aware

## 2019-02-14 ENCOUNTER — Inpatient Hospital Stay (HOSPITAL_COMMUNITY): Payer: Medicaid Other

## 2019-02-14 ENCOUNTER — Encounter (HOSPITAL_COMMUNITY): Payer: Self-pay | Admitting: *Deleted

## 2019-02-14 ENCOUNTER — Other Ambulatory Visit: Payer: Self-pay

## 2019-02-14 ENCOUNTER — Inpatient Hospital Stay (HOSPITAL_COMMUNITY)
Admission: AD | Admit: 2019-02-14 | Discharge: 2019-02-15 | DRG: 831 | Disposition: A | Discharge status: Other Acute Inpatient Hospital | Payer: Medicaid Other | Attending: Obstetrics & Gynecology | Admitting: Obstetrics & Gynecology

## 2019-02-14 DIAGNOSIS — O26892 Other specified pregnancy related conditions, second trimester: Secondary | ICD-10-CM

## 2019-02-14 DIAGNOSIS — B9689 Other specified bacterial agents as the cause of diseases classified elsewhere: Secondary | ICD-10-CM | POA: Diagnosis present

## 2019-02-14 DIAGNOSIS — Z6790 Unspecified blood type, Rh positive: Secondary | ICD-10-CM

## 2019-02-14 DIAGNOSIS — O200 Threatened abortion: Secondary | ICD-10-CM | POA: Diagnosis present

## 2019-02-14 DIAGNOSIS — Z3A16 16 weeks gestation of pregnancy: Secondary | ICD-10-CM | POA: Diagnosis not present

## 2019-02-14 DIAGNOSIS — O23592 Infection of other part of genital tract in pregnancy, second trimester: Secondary | ICD-10-CM | POA: Diagnosis not present

## 2019-02-14 DIAGNOSIS — O3430 Maternal care for cervical incompetence, unspecified trimester: Secondary | ICD-10-CM | POA: Diagnosis present

## 2019-02-14 DIAGNOSIS — O3432 Maternal care for cervical incompetence, second trimester: Secondary | ICD-10-CM | POA: Diagnosis present

## 2019-02-14 DIAGNOSIS — R109 Unspecified abdominal pain: Secondary | ICD-10-CM

## 2019-02-14 DIAGNOSIS — O039 Complete or unspecified spontaneous abortion without complication: Secondary | ICD-10-CM

## 2019-02-14 DIAGNOSIS — O26899 Other specified pregnancy related conditions, unspecified trimester: Secondary | ICD-10-CM

## 2019-02-14 DIAGNOSIS — Z20828 Contact with and (suspected) exposure to other viral communicable diseases: Secondary | ICD-10-CM | POA: Diagnosis present

## 2019-02-14 DIAGNOSIS — O034 Incomplete spontaneous abortion without complication: Secondary | ICD-10-CM | POA: Diagnosis present

## 2019-02-14 DIAGNOSIS — N898 Other specified noninflammatory disorders of vagina: Secondary | ICD-10-CM

## 2019-02-14 DIAGNOSIS — R102 Pelvic and perineal pain: Secondary | ICD-10-CM

## 2019-02-14 HISTORY — DX: Unspecified abnormal cytological findings in specimens from vagina: R87.629

## 2019-02-14 LAB — CBC WITH DIFFERENTIAL/PLATELET
Abs Immature Granulocytes: 0.07 10*3/uL (ref 0.00–0.07)
Basophils Absolute: 0 10*3/uL (ref 0.0–0.1)
Basophils Relative: 0 %
Eosinophils Absolute: 0.2 10*3/uL (ref 0.0–0.5)
Eosinophils Relative: 1 %
HCT: 35.7 % — ABNORMAL LOW (ref 36.0–46.0)
Hemoglobin: 11.5 g/dL — ABNORMAL LOW (ref 12.0–15.0)
Immature Granulocytes: 1 %
Lymphocytes Relative: 27 %
Lymphs Abs: 4 10*3/uL (ref 0.7–4.0)
MCH: 26.5 pg (ref 26.0–34.0)
MCHC: 32.2 g/dL (ref 30.0–36.0)
MCV: 82.3 fL (ref 80.0–100.0)
Monocytes Absolute: 1 10*3/uL (ref 0.1–1.0)
Monocytes Relative: 7 %
Neutro Abs: 9.7 10*3/uL — ABNORMAL HIGH (ref 1.7–7.7)
Neutrophils Relative %: 64 %
Platelets: 342 10*3/uL (ref 150–400)
RBC: 4.34 MIL/uL (ref 3.87–5.11)
RDW: 14.1 % (ref 11.5–15.5)
WBC: 14.9 10*3/uL — ABNORMAL HIGH (ref 4.0–10.5)
nRBC: 0 % (ref 0.0–0.2)

## 2019-02-14 LAB — URINALYSIS, ROUTINE W REFLEX MICROSCOPIC
Bilirubin Urine: NEGATIVE
Glucose, UA: >=500 mg/dL — AB
Ketones, ur: NEGATIVE mg/dL
Nitrite: NEGATIVE
Protein, ur: NEGATIVE mg/dL
Specific Gravity, Urine: 1.025 (ref 1.005–1.030)
pH: 6 (ref 5.0–8.0)

## 2019-02-14 LAB — T4, FREE: Free T4: 0.65 ng/dL (ref 0.61–1.12)

## 2019-02-14 LAB — TYPE AND SCREEN
ABO/RH(D): A POS
Antibody Screen: NEGATIVE

## 2019-02-14 LAB — SARS CORONAVIRUS 2 BY RT PCR (HOSPITAL ORDER, PERFORMED IN ~~LOC~~ HOSPITAL LAB): SARS Coronavirus 2: NEGATIVE

## 2019-02-14 LAB — TSH: TSH: 0.03 u[IU]/mL — ABNORMAL LOW (ref 0.350–4.500)

## 2019-02-14 MED ORDER — ZOLPIDEM TARTRATE 5 MG PO TABS
5.0000 mg | ORAL_TABLET | Freq: Every evening | ORAL | Status: DC | PRN
  Filled 2019-02-14: qty 1

## 2019-02-14 MED ORDER — LACTATED RINGERS IV SOLN
INTRAVENOUS | Status: DC
  Administered 2019-02-14 – 2019-02-15 (×3): via INTRAVENOUS

## 2019-02-14 MED ORDER — ONDANSETRON HCL 4 MG/2ML IJ SOLN
4.0000 mg | Freq: Three times a day (TID) | INTRAMUSCULAR | Status: DC | PRN
  Administered 2019-02-14 – 2019-02-15 (×2): 4 mg via INTRAVENOUS
  Filled 2019-02-14 (×2): qty 2

## 2019-02-14 NOTE — Progress Notes (Signed)
Pt put into Trendelenburg position secondary amniotic sac seen bulging outside of the vagina when pt placed in stir ups for pelvic exam.  Windy Carina, NP @ bedside discussing POC with pt understanding verbalized.

## 2019-02-14 NOTE — Progress Notes (Signed)
Pt. Requested to eat, call to Dr. Ilda Basset (who was in OR with a case). Pt. Not allowed to eat at this time. Requested that post case Dr. Ilda Basset visit patient as she has many questions.

## 2019-02-14 NOTE — MAU Note (Signed)
Pain when she stands or uses the bathroom. Feels like something is going out of her, this started today. Pain has been going on for 3 days.  Lots of pressure. Pain in lower abd and vagina

## 2019-02-14 NOTE — Plan of Care (Signed)
Pt. In modified trendelenberg, call to MD for POC update. Will update pt. Post case. No complaints of pain this evening, slight pressure but no increase in pressure or leakage. Pt. Is aware and in agreement to call if she changes.

## 2019-02-14 NOTE — H&P (Signed)
Obstetrics Admission History & Physical  02/14/2019 - 5:14 PM Primary OBGYN: [facility]  Chief Complaint: inevitable AB  History of Present Illness  33 y.o. G1P0000 @ [redacted]w[redacted]d, with the above CC. Pregnancy complicated by: LSIL pap.  Ms. Shambria A Mcclard states that she waited yesterday in the MAU and LWBS with chief complaint of abdominal discomfort. She felt constipated so took an enema at home and started feeling more pain and pressure so came to MAU. Exam here showed BOW at the introitus with normal FHR. U/s confirmed findings and still with normal FHR on prelim read.   Review of Systems:  as noted in the History of Present Illness.   PMHx:  Past Medical History:  Diagnosis Date  . Allergy   . Anxiety   . Breast mass   . Bronchitis   . BV (bacterial vaginosis)   . Depression    doing ok  . Eye irritation   . Headache   . Infection    UTI  . Seasonal allergies   . Tooth abscess   . Vaginal Pap smear, abnormal   . Vaginal yeast infection   . Yeast infection    PSHx:  Past Surgical History:  Procedure Laterality Date  . HIP SURGERY    . WISDOM TOOTH EXTRACTION     Medications:  Medications Prior to Admission  Medication Sig Dispense Refill Last Dose  . famotidine (PEPCID) 20 MG tablet Take 1 tablet (20 mg total) by mouth at bedtime. 30 tablet 2 02/14/2019 at 0000  . ondansetron (ZOFRAN-ODT) 8 MG disintegrating tablet Take 1 tablet (8 mg total) by mouth every 8 (eight) hours as needed for nausea or vomiting. 30 tablet 0 02/13/2019 at 2100  . Prenatal MV-Min-FA-Omega-3 (PRENATAL GUMMIES/DHA & FA) 0.4-32.5 MG CHEW Chew 1 tablet by mouth daily. 90 tablet 1 02/13/2019 at 1600  . triamcinolone (KENALOG) 0.025 % ointment Apply 1 application topically 2 (two) times daily. 454 g 0 02/13/2019 at 0500  . Blood Pressure Monitoring KIT 1 kit by Does not apply route once a week. 1 kit 0   . calcium carbonate (TUMS - DOSED IN MG ELEMENTAL CALCIUM) 500 MG chewable tablet Chew 1 tablet by mouth  daily.     . glycopyrrolate (ROBINUL) 1 MG tablet Take 1 tablet (1 mg total) by mouth 3 (three) times daily. For spitting 90 tablet 1   . ondansetron (ZOFRAN) 8 MG tablet Take 1 tablet (8 mg total) by mouth every 8 (eight) hours as needed for nausea or vomiting. 60 tablet 1   . promethazine (PHENERGAN) 25 MG tablet Take 1 tablet (25 mg total) by mouth every 6 (six) hours as needed for nausea or vomiting. 30 tablet 0   . scopolamine (TRANSDERM-SCOP, 1.5 MG,) 1 MG/3DAYS Place 1 patch (1.5 mg total) onto the skin every 3 (three) days. 10 patch 12      Allergies: has No Known Allergies. OBHx:  OB History  Gravida Para Term Preterm AB Living  1 0 0 0 0 0  SAB TAB Ectopic Multiple Live Births  0 0 0 0      # Outcome Date GA Lbr Len/2nd Weight Sex Delivery Anes PTL Lv  1 Current                FHx:  Family History  Problem Relation Age of Onset  . Diabetes Father   . Cancer Father   . Hypertension Sister   . Diabetes Sister   . Glaucoma Brother   .   Diabetes Brother   . Hypertension Brother    Soc Hx:  Social History   Socioeconomic History  . Marital status: Single    Spouse name: Not on file  . Number of children: Not on file  . Years of education: Not on file  . Highest education level: Not on file  Occupational History  . Not on file  Social Needs  . Financial resource strain: Not on file  . Food insecurity    Worry: Not on file    Inability: Not on file  . Transportation needs    Medical: Not on file    Non-medical: Not on file  Tobacco Use  . Smoking status: Never Smoker  . Smokeless tobacco: Never Used  Substance and Sexual Activity  . Alcohol use: Not Currently    Comment: Occasional  . Drug use: Not Currently    Types: Marijuana    Comment: Last Use beginning of May 2020  . Sexual activity: Yes    Birth control/protection: None  Lifestyle  . Physical activity    Days per week: Not on file    Minutes per session: Not on file  . Stress: Not on file   Relationships  . Social Herbalist on phone: Not on file    Gets together: Not on file    Attends religious service: Not on file    Active member of club or organization: Not on file    Attends meetings of clubs or organizations: Not on file    Relationship status: Not on file  . Intimate partner violence    Fear of current or ex partner: Not on file    Emotionally abused: Not on file    Physically abused: Not on file    Forced sexual activity: Not on file  Other Topics Concern  . Not on file  Social History Narrative   ** Merged History Encounter **        Objective     Current Vital Signs 24h Vital Sign Ranges  T 98.8 F (37.1 C) Temp  Avg: 98.8 F (37.1 C)  Min: 98.8 F (37.1 C)  Max: 98.8 F (37.1 C)  BP 112/68 BP  Min: 112/68  Max: 112/68  HR 99 Pulse  Avg: 99  Min: 99  Max: 99  RR 20 Resp  Avg: 20  Min: 20  Max: 20  SaO2 100 %   SpO2  Avg: 100 %  Min: 100 %  Max: 100 %       24 Hour I/O Current Shift I/O  Time Ins Outs No intake/output data recorded. No intake/output data recorded.   FHTs: normal   General: Well nourished, well developed female in no acute distress. Patient in trendelenburg Skin:  Warm and dry.  Cardiovascular: S1, S2 normal, no murmur, rub or gallop, regular rate and rhythm Respiratory:  Clear to auscultation bilateral. Normal respiratory effort Abdomen: gravid, nttp Neuro/Psych:  Normal mood and affect.   SVE: labia majora spread and clear BOW noted at the introitus  Labs  Pending. Pt is Rh pos  Radiology FHR 150s, fetus in vaginal canal  Assessment & Plan   33 y.o. G1P0000 @ 79w0dwith inevitable AB. Pt stable D/w her and I told her that I recommend proceeding with delivery in the MAU. I told her that there are no medications or surgeries that can be done to help the pregnancy not delivery. I told her that she is too early for the  fetus to be viable for nearly two months and that the longer she stays pregnant the higher  her chance for infection and bleeding.  I told her that usual course if she elects to watch and see is that she will spontaneously go into labor and very suddenly deliver.   She elects to watch and see. I told her that we will do FHTs every few hours and place a foley, IV, keep in Trendelenburg and make NPO.   Charlie Pickens, Jr. MD Attending [facility] (Faculty Practice)  

## 2019-02-14 NOTE — MAU Provider Note (Signed)
History     CSN: [id number]  Arrival date and time: 02/14/19 1431   First Provider Initiated Contact with Patient 02/14/19 1549      Chief Complaint  Patient presents with  . Abdominal Pain  . Vaginal Pain   [name] is a 33 y.o. G1P0000 at 63w0dwho presents to MAU for "feeling like something is coming out of me." Pt reports she came to MAU for her symptoms yesterday, but left after 2.5hrs because the wait was too long. Pt reports her symptoms started 3 days ago after she performed a home enema and felt cramping. Pt reports she usually does not experience cramping after an enema.  Onset: 3days ago Location: abdomen Duration: 3days Character: pelvic cramping, "feeling like something is coming out of me when I stand up or pee," pt denies pelvic pressure, reports "really sharp pains" q30-662m when standing or sitting or urinating, less frequently when lying down Aggravating/Associated: standing, sitting, urination, walking/none Relieving: laying down Treatment: none Severity: 0/10 currently  Pt reports recent increase in vaginal discharge in past 2days.  Pt denies VB, LOF, vaginal odor/itching. Pt denies N/V, constipation, diarrhea, or urinary problems. Pt denies fever, chills, fatigue, sweating or changes in appetite. Pt denies SOB or chest pain. Pt denies dizziness, HA, light-headedness, weakness.  Problems this pregnancy include: PCOS. Allergies? NKDA Current medications/supplements? Zofran, PNVs, Pepcid Prenatal care provider? [facility], next appt 02/15/2019   OB History    Gravida  1   Para  0   Term  0   Preterm  0   AB  0   Living  0     SAB  0   TAB  0   Ectopic  0   Multiple  0   Live Births              Past Medical History:  Diagnosis Date  . Allergy   . Anxiety   . Breast mass   . Bronchitis   . BV (bacterial vaginosis)   . Depression    doing ok  . Eye irritation   . Headache   . Infection    UTI  . Seasonal  allergies   . Tooth abscess   . Vaginal Pap smear, abnormal   . Vaginal yeast infection   . Yeast infection     Past Surgical History:  Procedure Laterality Date  . HIP SURGERY    . WISDOM TOOTH EXTRACTION      Family History  Problem Relation Age of Onset  . Diabetes Father   . Cancer Father   . Hypertension Sister   . Diabetes Sister   . Glaucoma Brother   . Diabetes Brother   . Hypertension Brother     Social History   Tobacco Use  . Smoking status: Never Smoker  . Smokeless tobacco: Never Used  Substance Use Topics  . Alcohol use: Not Currently    Comment: Occasional  . Drug use: Not Currently    Types: Marijuana    Comment: Last Use beginning of May 2020    Allergies: No Known Allergies  Medications Prior to Admission  Medication Sig Dispense Refill Last Dose  . famotidine (PEPCID) 20 MG tablet Take 1 tablet (20 mg total) by mouth at bedtime. 30 tablet 2 02/14/2019 at 0000  . ondansetron (ZOFRAN-ODT) 8 MG disintegrating tablet Take 1 tablet (8 mg total) by mouth every 8 (eight) hours as needed for nausea or vomiting. 30 tablet 0 02/13/2019 at 2100  .  Prenatal MV-Min-FA-Omega-3 (PRENATAL GUMMIES/DHA & FA) 0.4-32.5 MG CHEW Chew 1 tablet by mouth daily. 90 tablet 1 02/13/2019 at 1600  . triamcinolone (KENALOG) 0.025 % ointment Apply 1 application topically 2 (two) times daily. 454 g 0 02/13/2019 at 0500  . Blood Pressure Monitoring KIT 1 kit by Does not apply route once a week. 1 kit 0   . calcium carbonate (TUMS - DOSED IN MG ELEMENTAL CALCIUM) 500 MG chewable tablet Chew 1 tablet by mouth daily.     Marland Kitchen glycopyrrolate (ROBINUL) 1 MG tablet Take 1 tablet (1 mg total) by mouth 3 (three) times daily. For spitting 90 tablet 1   . ondansetron (ZOFRAN) 8 MG tablet Take 1 tablet (8 mg total) by mouth every 8 (eight) hours as needed for nausea or vomiting. 60 tablet 1   . promethazine (PHENERGAN) 25 MG tablet Take 1 tablet (25 mg total) by mouth every 6 (six) hours as needed for  nausea or vomiting. 30 tablet 0   . scopolamine (TRANSDERM-SCOP, 1.5 MG,) 1 MG/3DAYS Place 1 patch (1.5 mg total) onto the skin every 3 (three) days. 10 patch 12     Review of Systems  Constitutional: Negative for chills, diaphoresis, fatigue and fever.  Respiratory: Negative for shortness of breath.   Cardiovascular: Negative for chest pain.  Gastrointestinal: Positive for abdominal pain. Negative for constipation, diarrhea, nausea and vomiting.  Genitourinary: Positive for pelvic pain, vaginal discharge and vaginal pain. Negative for dysuria, flank pain, frequency, urgency and vaginal bleeding.  Neurological: Negative for dizziness, weakness, light-headedness and headaches.   Physical Exam   Blood pressure 112/68, pulse 99, temperature 98.8 F (37.1 C), temperature source Oral, resp. rate 20, weight 89.5 kg, last menstrual period 10/25/2018, SpO2 100 %.  Patient Vitals for the past 24 hrs:  BP Temp Temp src Pulse Resp SpO2 Weight  02/14/19 1449 112/68 98.8 F (37.1 C) Oral 99 20 100 % 89.5 kg   Physical Exam  Constitutional: She is oriented to person, place, and time. She appears well-developed and well-nourished. No distress.  HENT:  Head: Normocephalic and atraumatic.  Respiratory: Effort normal.  GI: Soft. She exhibits no distension and no mass. There is no abdominal tenderness. There is no rebound and no guarding.  Genitourinary: There is no rash, tenderness or lesion on the right labia. There is no rash, tenderness or lesion on the left labia.    Genitourinary Comments: Watery discharge present on labia and in gluteal crease. Membranes present and bulging at introitus and visible without speculum examination. After membranes visualized, exam halted and patient was placed in Trendelenberg position.   Neurological: She is alert and oriented to person, place, and time.  Skin: Skin is warm and dry. She is not diaphoretic.  Psychiatric: She has a normal mood and affect. Her behavior  is normal. Judgment and thought content normal.   Results for orders placed or performed during the hospital encounter of 02/14/19 (from the past 24 hour(s))  Urinalysis, Routine w reflex microscopic     Status: Abnormal   Collection Time: 02/14/19  3:45 PM  Result Value Ref Range   Color, Urine YELLOW YELLOW   APPearance CLOUDY (A) CLEAR   Specific Gravity, Urine 1.025 1.005 - 1.030   pH 6.0 5.0 - 8.0   Glucose, UA >=500 (A) NEGATIVE mg/dL   Hgb urine dipstick SMALL (A) NEGATIVE   Bilirubin Urine NEGATIVE NEGATIVE   Ketones, ur NEGATIVE NEGATIVE mg/dL   Protein, ur NEGATIVE NEGATIVE mg/dL   Nitrite  NEGATIVE NEGATIVE   Leukocytes,Ua SMALL (A) NEGATIVE   RBC / HPF 0-5 0 - 5 RBC/hpf   WBC, UA 0-5 0 - 5 WBC/hpf   Bacteria, UA MANY (A) NONE SEEN   Squamous Epithelial / LPF 6-10 0 - 5   Mucus PRESENT    [place] Mfm Ob Limited  Result Date: 02/14/2019 ----------------------------------------------------------------------  OBSTETRICS REPORT                       (Signed Final 02/14/2019 05:17 pm) ---------------------------------------------------------------------- Patient Info  ID #:       [id number]                          D.O.B.:  03-Apr-1986 (32 yrs)  Name:       Brenda West              Visit Date: 02/14/2019 04:48 pm ---------------------------------------------------------------------- Performed By  Performed By:     Enriqueta Shutter           Referred By:      MAU Nursing-                    RDMS, RVT                                MAU/Triage  Attending:        Tama High MD        [facility]                                                             [place] ---------------------------------------------------------------------- Orders   #  Description                          Code         Ordered By   1  [place] MFM OB LIMITED                    40981.19     NICOLE NUGENT  ----------------------------------------------------------------------   #  Order #                     Accession #                 Episode #   1  [id number]                  [id number]                  [id number]  ---------------------------------------------------------------------- Indications   [redacted] weeks gestation of pregnancy                Z3A.16   Threatened abortion                            O20.0  ---------------------------------------------------------------------- Fetal Evaluation  Num Of Fetuses:         1  Fetal Heart Rate(bpm):  159  Cardiac Activity:       Observed  Fetal Lie:  In vaginal canal  Presentation:           Oblique  Placenta:               Posterior ---------------------------------------------------------------------- Gestational Age  LMP:           16w 0d        Date:  10/25/18                 EDD:   08/01/19  Best:          Martyn Ehrich 0d     Det. By:  LMP  (10/25/18)          EDD:   08/01/19 ---------------------------------------------------------------------- Impression  Patient is being evaluated for abdominal pain.  On transabdominal scan, amniotic membrane with entire  fetus is seen in the sac in the vagina. Fetal heart activity is  seen.  Impression: Inevitable miscarriage.  Discussed the findings with MAU. ----------------------------------------------------------------------                  Tama High, MD Electronically Signed Final Report   02/14/2019 05:17 pm ----------------------------------------------------------------------  MAU Course  Procedures  MDM -PTL symptoms with bulging membranes at vaginal introitus -pt placed in Trendelenberg -inevitable miscarriage -[place]: +FHB, amniotic sac in vaginal canal, cervix dilated, posterior placenta still attached -called Dr. Ilda Basset _0  to discuss case, Dr. Ilda Basset to bedside and discussed with patient that this is inevitable miscarriage and recommended completing delivery in MAU. Pt declines and wishes to remain in observation overnight to "weigh her options." -admit to [facility] for  observation  Orders Placed This Encounter  Procedures  . Wet prep, genital    Standing Status:   Standing    Number of Occurrences:   1  . OB Urine Culture    Standing Status:   Standing    Number of Occurrences:   1  . SARS Coronavirus 2 [facility] order, Performed in [facility] hospital lab) Nasopharyngeal Nasopharyngeal Swab    Standing Status:   Standing    Number of Occurrences:   1    Order Specific Question:   Is this test for diagnosis or screening    Answer:   Screening    Order Specific Question:   Symptomatic for COVID-19 as defined by CDC    Answer:   No    Order Specific Question:   Hospitalized for COVID-19    Answer:   No    Order Specific Question:   Admitted to ICU for COVID-19    Answer:   No    Order Specific Question:   Previously tested for COVID-19    Answer:   No    Order Specific Question:   Resident in a congregate (group) care setting    Answer:   No    Order Specific Question:   Employed in healthcare setting    Answer:   Unknown    Order Specific Question:   Pregnant    Answer:   Yes  . [place] MFM OB LIMITED    Amniotic sac visible at vaginal introitus.    Standing Status:   Standing    Number of Occurrences:   1    Order Specific Question:   Symptom/Reason for Exam    Answer:   Vaginal pain [833825]  . Urinalysis, Routine w reflex microscopic    Standing Status:   Standing    Number of Occurrences:   1  . Urine rapid drug screen (hosp performed)  Standing Status:   Standing    Number of Occurrences:   1  . CBC with Differential    Standing Status:   Standing    Number of Occurrences:   1  . RPR    Standing Status:   Standing    Number of Occurrences:   1  . TSH    Standing Status:   Standing    Number of Occurrences:   1  . Diet NPO time specified    Standing Status:   Standing    Number of Occurrences:   1  . Insert foley catheter    Standing Status:   Standing    Number of Occurrences:   1  . Place and maintain sequential  compression device    Standing Status:   Standing    Number of Occurrences:   1  . Notify physician (specify)    Standing Status:   Standing    Number of Occurrences:   20    Order Specific Question:   Notify Physician    Answer:   for pulse less than 60 or greater than 120    Order Specific Question:   Notify Physician    Answer:   for respiratory rate less than 12 or greater than 28    Order Specific Question:   Notify Physician    Answer:   for temperature greater than 100.4    Order Specific Question:   Notify Physician    Answer:   for urinary output less than 30 ml/hr    Order Specific Question:   Notify Physician    Answer:   for systolic BP less than 80 or greater than 140    Order Specific Question:   Notify Physician    Answer:   for diastolic BP less than 40 or greater than 90  . Vital signs    While awake, respect sleep.    Standing Status:   Standing    Number of Occurrences:   1  . Defer vaginal exam for vaginal bleeding or PROM <37 weeks    Standing Status:   Standing    Number of Occurrences:   1  . Initiate Oral Care Protocol    Standing Status:   Standing    Number of Occurrences:   1  . Initiate Carrier Fluid Protocol    Standing Status:   Standing    Number of Occurrences:   1  . SCDs    Standing Status:   Standing    Number of Occurrences:   1    Order Specific Question:   Laterality    Answer:   Bilateral  . Assess fetal heart tones by doppler    Standing Status:   Standing    Number of Occurrences:   1  . Strict bed rest    Standing Status:   Standing    Number of Occurrences:   1  . Full code    Standing Status:   Standing    Number of Occurrences:   1  . Airborne and Contact precautions    Standing Status:   Standing    Number of Occurrences:   1  . Type and screen [place]     Standing Status:   Standing    Number of Occurrences:   1  . Insert peripheral IV    Standing Status:    Standing    Number of Occurrences:  1  . Admit to Inpatient (patient's expected length of stay will be greater than 2 midnights or inpatient only procedure)    Standing Status:   Standing    Number of Occurrences:   1    Order Specific Question:   Hospital Area    Answer:   [place] [100100]    Order Specific Question:   Level of Care    Answer:   Antepartum [20]    Order Specific Question:   Covid Evaluation    Answer:   Asymptomatic Screening Protocol (No Symptoms)    Order Specific Question:   Diagnosis    Answer:   Inevitable abortion [209470]    Order Specific Question:   Admitting Physician    Answer:   Aletha Halim [[id number]]    Order Specific Question:   Attending Physician    Answer:   Aletha Halim [[id number]]    Order Specific Question:   Estimated length of stay    Answer:   past midnight tomorrow    Order Specific Question:   Certification:    Answer:   I certify this patient will need inpatient services for at least 2 midnights    Order Specific Question:   PT Class (Do Not Modify)    Answer:   Inpatient [101]    Order Specific Question:   PT Acc Code (Do Not Modify)    Answer:   Private [1]   Meds ordered this encounter  Medications  . lactated ringers infusion  . zolpidem (AMBIEN) tablet 5 mg   Assessment and Plan   -inevitable miscarriage -admit to [facility] for observation  Gerrie Nordmann Elenore Wanninger 02/14/2019, 5:28 PM

## 2019-02-15 ENCOUNTER — Encounter: Payer: Medicaid Other | Admitting: Obstetrics and Gynecology

## 2019-02-15 LAB — WET PREP, GENITAL
Sperm: NONE SEEN
Trich, Wet Prep: NONE SEEN
Yeast Wet Prep HPF POC: NONE SEEN

## 2019-02-15 LAB — RAPID URINE DRUG SCREEN, HOSP PERFORMED
Amphetamines: NOT DETECTED
Barbiturates: NOT DETECTED
Benzodiazepines: NOT DETECTED
Cocaine: NOT DETECTED
Opiates: NOT DETECTED
Tetrahydrocannabinol: NOT DETECTED

## 2019-02-15 LAB — CULTURE, OB URINE

## 2019-02-15 LAB — RPR: RPR Ser Ql: NONREACTIVE

## 2019-02-15 NOTE — Progress Notes (Signed)
Pt out with  Transport service vs stable and fhr charted

## 2019-02-15 NOTE — Progress Notes (Signed)
Visited with Layan in her room.  Patient was eating and initially emotionally guarded, but began to share her fear and frustration with the process.  Kaysha shared that she does not yet know the sex of her baby, she hoped to have a gender reveal on August 31st, which is her mother's birthday.  Lonni's mother was killed when she was 3 and she has missed her presence in her life.  Corra shared that she was raised by her aunt (her father is also deceased) and that her aunt has since died.  Recently, her aunt and father's obituaries have been falling off the wall despite the tape being sticky, has felt like an omen to her.  Nalla thought she could not have children due to her PCOS and felt like this baby was a miracle.  She's been feeling discouraged lately about her skin and experiencing depression.  She's also faced some discrimination in employment and social stressors, which have her worried.  Pt reports that she prays often, and that gives her hope.  She is requesting to be transferred to [facility] for further evaluation.  She is concerned that her baby isn't being given a fair chance to survive and wants to do everything she can to see this pregnancy through.  I offered prayer at the patient's request.  Pt is aware that she is able to continue to contact spiritual care for continued support even after discharge.  Please page as further needs arise.  Donald Prose. Elyn Peers, M.Div. [facility] Chaplain Pager [phone] Office [phone]

## 2019-02-15 NOTE — Discharge Summary (Signed)
Antenatal Physician Discharge/Transfer Summary  Patient ID: Brenda West MRN: [id number] DOB/AGE: 1985-08-03 33 y.o.  Admit date: 02/14/2019 Discharge date: 02/15/2019  Admission Diagnoses:   Active Problems:   Cervical insufficiency during pregnancy, antepartum   Inevitable abortion  Discharge Diagnoses: The same, bacterial vaginosis  Prenatal Procedures: ultrasound  Consults: Maternal Fetal Medicine  Hospital Course:  This is a 33 y.o. G1P0000 with IUP at 50w1dadmitted after she was noted to have a bulging bag of membranes in the vagina.  She was counseled about her poor prognosis, options discussed. She opted for expectant management. Wanted cerclage, she was told this would not be able to be done in present state.  [facility] that they will put in a cerclage and do more intervention to save her baby. She had no signs/symptoms of concerns.  Wet prep was done on the day of transfer, was unable to start treatment prior to transfer.  Her exam was unchanged from admission, BBOW noted about 1 cm from introitus.  On HD#2, I called [facility] and explained patient's preference for treatment there; Dr. ACliffton Astersaccepted transfer of patient for evaluation. Patient was informed that no intervention was guaranteed, but they will evaluate.  [facility]  Discharge Exam: Temp:  [98.2 F (36.8 C)-99.3 F (37.4 C)] 98.4 F (36.9 C) (08/11 1153) Pulse Rate:  [95-116] 115 (08/11 1153) Resp:  [18-20] 18 (08/11 1153) BP: (92-127)/(48-70) 124/61 (08/11 1153) SpO2:  [98 %-100 %] 98 % (08/11 1153) Weight:  [89.4 kg-89.5 kg] 89.4 kg (08/10 2100)  FHR 156 bpm  Physical Examination: CONSTITUTIONAL: Well-developed, well-nourished female in no acute distress.  HENT:  Normocephalic, atraumatic, External right and left ear normal. Oropharynx is clear and moist EYES: Conjunctivae and EOM are normal. Pupils are equal, round, and reactive to light. No  scleral icterus.  NECK: Normal range of motion, supple, no masses SKIN: Skin is warm and dry. No rash noted. Not diaphoretic. No erythema. No pallor. NFriedens Alert and oriented to person, place, and time. Normal reflexes, muscle tone coordination. No cranial nerve deficit noted. PSYCHIATRIC: Normal mood and affect. Normal behavior. Normal judgment and thought content. CARDIOVASCULAR: Normal heart rate noted, regular rhythm RESPIRATORY: Effort and breath sounds normal, no problems with respiration noted MUSCULOSKELETAL: Normal range of motion. No edema and no tenderness. 2+ distal pulses. ABDOMEN: Soft, nontender, nondistended, gravid. PELVIC:  BBOW about 1 cm from introitus, yellow vaginal discharge note.   Significant Diagnostic Studies:  Results for orders placed or performed during the hospital encounter of 02/14/19 (from the past 168 hour(s))  Urinalysis, Routine w reflex microscopic   Collection Time: 02/14/19  3:45 PM  Result Value Ref Range   Color, Urine YELLOW YELLOW   APPearance CLOUDY (A) CLEAR   Specific Gravity, Urine 1.025 1.005 - 1.030   pH 6.0 5.0 - 8.0   Glucose, UA >=500 (A) NEGATIVE mg/dL   Hgb urine dipstick SMALL (A) NEGATIVE   Bilirubin Urine NEGATIVE NEGATIVE   Ketones, ur NEGATIVE NEGATIVE mg/dL   Protein, ur NEGATIVE NEGATIVE mg/dL   Nitrite NEGATIVE NEGATIVE   Leukocytes,Ua SMALL (A) NEGATIVE   RBC / HPF 0-5 0 - 5 RBC/hpf   WBC, UA 0-5 0 - 5 WBC/hpf   Bacteria, UA MANY (A) NONE SEEN   Squamous Epithelial / LPF 6-10 0 - 5   Mucus PRESENT   TSH   Collection Time: 02/14/19  5:15 PM  Result Value Ref Range  TSH 0.030 (L) 0.350 - 4.500 uIU/mL  T4, free   Collection Time: 02/14/19  5:15 PM  Result Value Ref Range   Free T4 0.65 0.61 - 1.12 ng/dL  SARS Coronavirus 2 [facility] order, Performed in [facility] hospital lab) Nasopharyngeal Nasopharyngeal Swab   Collection Time: 02/14/19  5:40 PM   Specimen: Nasopharyngeal Swab  Result Value Ref Range    SARS Coronavirus 2 NEGATIVE NEGATIVE  Type and screen [place]   Collection Time: 02/14/19  5:45 PM  Result Value Ref Range   ABO/RH(D) A POS    Antibody Screen NEG    Sample Expiration      02/17/2019,2359 Performed at [facility], [place] [address], [place], [place] [address]   CBC with Differential   Collection Time: 02/14/19  6:00 PM  Result Value Ref Range   WBC 14.9 (H) 4.0 - 10.5 K/uL   RBC 4.34 3.87 - 5.11 MIL/uL   Hemoglobin 11.5 (L) 12.0 - 15.0 g/dL   HCT 35.7 (L) 36.0 - 46.0 %   MCV 82.3 80.0 - 100.0 fL   MCH 26.5 26.0 - 34.0 pg   MCHC 32.2 30.0 - 36.0 g/dL   RDW 14.1 11.5 - 15.5 %   Platelets 342 150 - 400 K/uL   nRBC 0.0 0.0 - 0.2 %   Neutrophils Relative % 64 %   Neutro Abs 9.7 (H) 1.7 - 7.7 K/uL   Lymphocytes Relative 27 %   Lymphs Abs 4.0 0.7 - 4.0 K/uL   Monocytes Relative 7 %   Monocytes Absolute 1.0 0.1 - 1.0 K/uL   Eosinophils Relative 1 %   Eosinophils Absolute 0.2 0.0 - 0.5 K/uL   Basophils Relative 0 %   Basophils Absolute 0.0 0.0 - 0.1 K/uL   Immature Granulocytes 1 %   Abs Immature Granulocytes 0.07 0.00 - 0.07 K/uL  RPR   Collection Time: 02/14/19  6:00 PM  Result Value Ref Range   RPR Ser Ql Non Reactive Non Reactive  Urine rapid drug screen (hosp performed)   Collection Time: 02/15/19  4:51 AM  Result Value Ref Range   Opiates NONE DETECTED NONE DETECTED   Cocaine NONE DETECTED NONE DETECTED   Benzodiazepines NONE DETECTED NONE DETECTED   Amphetamines NONE DETECTED NONE DETECTED   Tetrahydrocannabinol NONE DETECTED NONE DETECTED   Barbiturates NONE DETECTED NONE DETECTED  Wet prep, genital   Collection Time: 02/15/19  9:15 AM   Specimen: Vaginal  Result Value Ref Range   Yeast Wet Prep HPF POC NONE SEEN NONE SEEN   Trich, Wet Prep NONE SEEN NONE SEEN   Clue Cells Wet Prep HPF POC PRESENT (A) NONE SEEN   WBC, Wet Prep HPF POC MODERATE (A) NONE SEEN   Sperm NONE SEEN    [place] Mfm Ob Limited  Result Date:  02/14/2019 ----------------------------------------------------------------------  OBSTETRICS REPORT                       (Signed Final 02/14/2019 05:17 pm) ---------------------------------------------------------------------- Patient Info  ID #:       [id number]                          D.O.B.:  1986/05/08 (32 yrs)  Name:       Herbert Deaner              Visit Date: 02/14/2019 04:48 pm ---------------------------------------------------------------------- Performed By  Performed By:  Enriqueta Shutter           Referred By:      MAU Nursing-                    RDMS, RVT                                MAU/Triage  Attending:        Tama High MD        [facility] ---------------------------------------------------------------------- Orders   #  Description                          Code         Ordered By   1  [place] MFM OB LIMITED                    [phone]     NICOLE NUGENT  ----------------------------------------------------------------------   #  Order #                    Accession #                 Episode #   1  [id number]                  [id number]                  [id number]  ---------------------------------------------------------------------- Indications   [redacted] weeks gestation of pregnancy                Z3A.16   Threatened abortion                            O20.0  ---------------------------------------------------------------------- Fetal Evaluation  Num Of Fetuses:         1  Fetal Heart Rate(bpm):  159  Cardiac Activity:       Observed  Fetal Lie:              In vaginal canal  Presentation:           Oblique  Placenta:               Posterior ---------------------------------------------------------------------- Gestational Age  LMP:           16w 0d        Date:  10/25/18                 EDD:   08/01/19  Best:          16w 0d     Det. By:  LMP  (10/25/18)          EDD:   08/01/19  ---------------------------------------------------------------------- Impression  Patient is being evaluated for abdominal pain.  On transabdominal scan, amniotic membrane with entire  fetus is seen in the sac in the vagina. Fetal heart activity is  seen.  Impression: Inevitable miscarriage.  Discussed the findings with MAU. ----------------------------------------------------------------------  Tama High, MD Electronically Signed Final Report   02/14/2019 05:17 pm ----------------------------------------------------------------------   Future Appointments  Date Time Provider [place]  02/15/2019  2:30 PM Constant, Vickii Chafe, MD [place] None  03/10/2019 10:30 AM WH-MFC [place] 1 WH-MFCUS MFC-US  03/10/2019  1:00 PM Constant, Vickii Chafe, MD [place] None    Discharge Condition: Stable  Discharge disposition: [place] Not Defined        Allergies as of 02/15/2019   No Known Allergies     Medication List    TAKE these medications   Blood Pressure Monitoring Kit 1 kit by Does not apply route once a week.   calcium carbonate 500 MG chewable tablet Commonly known as: TUMS - dosed in mg elemental calcium Chew 1 tablet by mouth daily.   famotidine 20 MG tablet Commonly known as: PEPCID Take 1 tablet (20 mg total) by mouth at bedtime.   glycopyrrolate 1 MG tablet Commonly known as: ROBINUL Take 1 tablet (1 mg total) by mouth 3 (three) times daily. For spitting   ondansetron 8 MG disintegrating tablet Commonly known as: ZOFRAN-ODT Take 1 tablet (8 mg total) by mouth every 8 (eight) hours as needed for nausea or vomiting.   ondansetron 8 MG tablet Commonly known as: Zofran Take 1 tablet (8 mg total) by mouth every 8 (eight) hours as needed for nausea or vomiting.   Prenatal Gummies/DHA & FA 0.4-32.5 MG Chew Chew 1 tablet by mouth daily.   promethazine 25 MG tablet Commonly known as: PHENERGAN Take 1 tablet (25 mg total) by mouth every 6  (six) hours as needed for nausea or vomiting.   scopolamine 1 MG/3DAYS Commonly known as: Transderm-Scop (1.5 MG) Place 1 patch (1.5 mg total) onto the skin every 3 (three) days.   triamcinolone 0.025 % ointment Commonly known as: KENALOG Apply 1 application topically 2 (two) times daily.        Signed: Verita Schneiders M.D. 02/15/2019, 1:46 PM

## 2019-02-15 NOTE — Progress Notes (Signed)
Pt. States her contact is Dr. Lincoln Maxin, # [phone]. Will notify FP during rounds

## 2019-02-15 NOTE — Progress Notes (Signed)
Called to pt. Room. Pt. States she has called [facility] and wants to transfer to [facility]. Has spoken to a "lady doctor" not able to provide name but was able to provide a number. [phone]. Dr. Ilda Basset aware of pt's desire to transfer.

## 2019-02-15 NOTE — Progress Notes (Signed)
OB Note CTSP regarding her having spoke to a physician at [facility] who may offer her a cerclage.  I spoke with her for over 20 minutes regarding her situation. I spoke to one of the residents who gave me the number to their attending, who was paged, and we are awaiting a call back to the patient's cell number. I asked her what her understanding of the situation and she states that she is upset b/c she waited for more than two hours after her initial triage assessment yesterday and she felt that if she had been seen earlier that she wouldn't have left yesterday and that maybe something could've been done at that time. She also states that a cerclage might be possible because of the membranes at the cervix and the other doctors said that a cerclage might be possible. I told her that have membranes at the cervix vs membranes at the vaginal opening, which is what she has, is very different and that a cerclage might be considered with membranes at the cervix but it is highly unlikely that any provider would offer surgery with her exam being what I saw in triage. I told her that we can have our MFM specialist see and talk to her later today or if she would like for [place] to try and  Contact other providers for a 2nd opinion then we can. I told her if she wants to consider 2nd opinions that she needs to stop drinking and stay NPO.  Patient to let us know her decision. Recent FHTs normal per RN.   Durene Romans MD Attending Center for [employer] (Faculty Practice) 02/15/2019 Time: [phone]

## 2019-02-15 NOTE — Progress Notes (Signed)
Report called to [facility]   Receiving RN [facility]  Room number assigned [phone]

## 2019-02-15 NOTE — Plan of Care (Signed)
Pt for transfer to [place]

## 2019-02-16 LAB — CERVICOVAGINAL ANCILLARY ONLY
Chlamydia: NEGATIVE
Neisseria Gonorrhea: NEGATIVE
Trichomonas: NEGATIVE

## 2019-02-17 ENCOUNTER — Other Ambulatory Visit: Payer: Self-pay | Admitting: Obstetrics and Gynecology

## 2019-02-17 MED ORDER — SIMETHICONE 80 MG PO CHEW
80.00 | CHEWABLE_TABLET | ORAL | Status: DC
Start: ? — End: 2019-02-17

## 2019-02-17 MED ORDER — WITCH HAZEL-GLYCERIN EX PADS
1.00 | MEDICATED_PAD | CUTANEOUS | Status: DC
Start: ? — End: 2019-02-17

## 2019-02-17 MED ORDER — ALUM & MAG HYDROXIDE-SIMETH 400-400-40 MG/5ML PO SUSP
15.00 | ORAL | Status: DC
Start: ? — End: 2019-02-17

## 2019-02-17 MED ORDER — DOCUSATE SODIUM 100 MG PO CAPS
100.00 | ORAL_CAPSULE | ORAL | Status: DC
Start: 2019-02-17 — End: 2019-02-17

## 2019-02-17 MED ORDER — METHYLERGONOVINE MALEATE 0.2 MG/ML IJ SOLN
.20 | INTRAMUSCULAR | Status: DC
Start: ? — End: 2019-02-17

## 2019-02-17 MED ORDER — MISOPROSTOL 200 MCG PO TABS
800.00 | ORAL_TABLET | ORAL | Status: DC
Start: ? — End: 2019-02-17

## 2019-02-17 MED ORDER — IBUPROFEN 600 MG PO TABS
600.00 | ORAL_TABLET | ORAL | Status: DC
Start: ? — End: 2019-02-17

## 2019-02-17 MED ORDER — ACETAMINOPHEN 325 MG PO TABS
650.00 | ORAL_TABLET | ORAL | Status: DC
Start: ? — End: 2019-02-17

## 2019-02-17 MED ORDER — METHYLERGONOVINE MALEATE 0.2 MG PO TABS
.20 | ORAL_TABLET | ORAL | Status: DC
Start: 2019-02-17 — End: 2019-02-17

## 2019-02-17 MED ORDER — BENZOCAINE 20 % EX AERO
1.00 | INHALATION_SPRAY | CUTANEOUS | Status: DC
Start: ? — End: 2019-02-17

## 2019-02-17 MED ORDER — OXYTOCIN-SODIUM CHLORIDE 20-0.9 UT/500ML-% IV SOLN
10.00 | INTRAVENOUS | Status: DC
Start: ? — End: 2019-02-17

## 2019-02-17 MED ORDER — ONDANSETRON HCL 4 MG/2ML IJ SOLN
4.00 | INTRAMUSCULAR | Status: DC
Start: ? — End: 2019-02-17

## 2019-02-17 MED ORDER — POLYETHYLENE GLYCOL 3350 17 G PO PACK
17.00 | PACK | ORAL | Status: DC
Start: ? — End: 2019-02-17

## 2019-02-17 MED ORDER — PNV PRENATAL PLUS MULTIVITAMIN 27-1 MG PO TABS
1.00 | ORAL_TABLET | ORAL | Status: DC
Start: 2019-02-18 — End: 2019-02-17

## 2019-03-10 ENCOUNTER — Ambulatory Visit (HOSPITAL_COMMUNITY): Payer: Medicaid Other

## 2019-03-10 ENCOUNTER — Encounter: Payer: Medicaid Other | Admitting: Obstetrics and Gynecology

## 2019-03-19 ENCOUNTER — Encounter (HOSPITAL_COMMUNITY): Payer: Self-pay

## 2019-03-19 ENCOUNTER — Other Ambulatory Visit: Payer: Self-pay

## 2019-03-19 ENCOUNTER — Telehealth: Payer: Self-pay | Admitting: Student

## 2019-03-19 ENCOUNTER — Inpatient Hospital Stay (HOSPITAL_COMMUNITY)
Admission: AD | Admit: 2019-03-19 | Discharge: 2019-03-19 | Disposition: A | Discharge status: Home / Self Care | Payer: Medicaid Other | Attending: Obstetrics and Gynecology | Admitting: Obstetrics and Gynecology

## 2019-03-19 DIAGNOSIS — R109 Unspecified abdominal pain: Secondary | ICD-10-CM | POA: Diagnosis not present

## 2019-03-19 DIAGNOSIS — Z3202 Encounter for pregnancy test, result negative: Secondary | ICD-10-CM | POA: Insufficient documentation

## 2019-03-19 LAB — POCT PREGNANCY, URINE: Preg Test, Ur: NEGATIVE

## 2019-03-19 LAB — HCG, QUANTITATIVE, PREGNANCY: hCG, Beta Chain, Quant, S: 6 m[IU]/mL — ABNORMAL HIGH (ref <5)

## 2019-03-19 NOTE — MAU Note (Signed)
Brenda West is a 34 y.o.  here in MAU reporting: states she had a miscarriage back in august, has been getting care in [place]. States on Thursday she went to her appointment and was talking to her doctor about some symptoms she was having that could be signs of early pregnancy. They did a UPT in the office since the patient reports she has been having sex since her miscarriage and an HCG level. Her providers wanted her to have her levels redrawn today. Having intermittent abdominal pain, not having the pain currently. No bleeding. Per [facility] pt's HCG level was 7 on 03/17/19  Pain score: 0/10  Vitals:   03/19/19 1341  BP: (!) 115/92  Pulse: 80  Resp: 18  Temp: 98.7 F (37.1 C)  SpO2: 100%      Lab orders placed from triage: none

## 2019-03-19 NOTE — Telephone Encounter (Signed)
Called patient and informed her of her quant (now 6), and need to follow up with her ob at [facility].  Recommended that she abstain from trying to get pregnant until her period has returned; patient is also taking acne medicine that is contraindicated in pregnancy. Patient will use birth control to avoid pregnancy until she speaks with her ob further.   Maye Hides

## 2019-03-19 NOTE — MAU Note (Cosign Needed)
Patient Brenda West is a 33 y.o. G1P0000 bc her provider wanted her BHCG level drawn today (provider is at [facility]). She endorses some on and off again abdominal pain but feels nothing today. UPT was negative; will drawn bHCG and call with results. Explained to patient that MAU is not for lab draws but that we understand her situation; in the future she will need to have her labs done at [place] or stand-alone phlebotomy lab.   Maye Hides

## 2019-03-24 ENCOUNTER — Encounter: Payer: Self-pay | Admitting: Student

## 2019-08-05 ENCOUNTER — Ambulatory Visit
Admission: EM | Admit: 2019-08-05 | Discharge: 2019-08-05 | Disposition: A | Discharge status: Home / Self Care | Payer: Medicaid Other

## 2019-11-27 ENCOUNTER — Ambulatory Visit
Admission: EM | Admit: 2019-11-27 | Discharge: 2019-11-27 | Disposition: A | Discharge status: Home / Self Care | Payer: Medicaid Other | Attending: Emergency Medicine | Admitting: Emergency Medicine

## 2019-11-27 ENCOUNTER — Other Ambulatory Visit: Payer: Self-pay

## 2019-11-27 ENCOUNTER — Encounter: Payer: Self-pay | Admitting: Emergency Medicine

## 2019-11-27 DIAGNOSIS — Z7251 High risk heterosexual behavior: Secondary | ICD-10-CM | POA: Insufficient documentation

## 2019-11-27 DIAGNOSIS — N760 Acute vaginitis: Secondary | ICD-10-CM | POA: Insufficient documentation

## 2019-11-27 DIAGNOSIS — R350 Frequency of micturition: Secondary | ICD-10-CM | POA: Diagnosis present

## 2019-11-27 LAB — POCT URINALYSIS DIP (MANUAL ENTRY)
Bilirubin, UA: NEGATIVE
Glucose, UA: NEGATIVE mg/dL
Ketones, POC UA: NEGATIVE mg/dL
Leukocytes, UA: NEGATIVE
Nitrite, UA: NEGATIVE
Protein Ur, POC: NEGATIVE mg/dL
Spec Grav, UA: >=1.03 — AB (ref 1.010–1.025)
Urobilinogen, UA: 0.2 E.U./dL
pH, UA: 6 (ref 5.0–8.0)

## 2019-11-27 LAB — POCT URINE PREGNANCY: Preg Test, Ur: NEGATIVE

## 2019-11-27 LAB — POCT FASTING CBG KUC MANUAL ENTRY: POCT Glucose (KUC): 94 mg/dL (ref 70–99)

## 2019-11-27 MED ORDER — FLUCONAZOLE 200 MG PO TABS: 200.0000 mg | ORAL_TABLET | Freq: Once | ORAL | 1 refills | Status: AC

## 2019-11-27 NOTE — ED Provider Notes (Signed)
[name]    CSN: [id number] Arrival date & time: 11/27/19  1129      History   Chief Complaint Chief Complaint  Patient presents with  . Vaginitis    HPI Brenda West is a 34 y.o. female with history of headaches, yeast infection, seasonal allergies presenting for urinary frequency, polydipsia "for months ".  Patient states she was evaluated by her PCP for this: Negative urine culture, no screening for diabetes.  Patient denies polyphagia, nausea, vomiting, abdominal pain.  Patient does note vaginal irritation: Unsure if this is BV or yeast.  Patient does endorse frequent yeast vaginitis.  Requesting urine pregnancy and urine dipstick due to persistent symptoms.  LMP 10/18/2019: Normal for her.  Denying pelvic or vaginal pain, bleeding, fever.   Past Medical History:  Diagnosis Date  . Allergy   . Anxiety   . Breast mass   . Bronchitis   . BV (bacterial vaginosis)   . Depression    doing ok  . Eye irritation   . Headache   . Infection    UTI  . Seasonal allergies   . Tooth abscess   . Vaginal Pap smear, abnormal   . Vaginal yeast infection   . Yeast infection     Patient Active Problem List   Diagnosis Date Noted  . Cervical insufficiency during pregnancy, antepartum 02/14/2019  . Inevitable abortion 02/14/2019  . 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 Encounter for supervision of normal pregnancy, unspecified, unspecified trimester 01/04/2019    Past Surgical History:  Procedure Laterality Date  . HIP SURGERY    . WISDOM TOOTH EXTRACTION      OB History    Gravida  1   Para  0   Term  0   Preterm  0   AB  0   Living  0     SAB  0   TAB  0   Ectopic  0   Multiple  0   Live Births               Home Medications    Prior to Admission medications   Medication Sig Start Date End Date Taking? Authorizing Provider  Blood Pressure Monitoring KIT 1 kit by Does not apply route once a week. 01/13/19   Glenice Bow, DO   calcium carbonate (TUMS - DOSED IN MG ELEMENTAL CALCIUM) 500 MG chewable tablet Chew 1 tablet by mouth daily.    [provider]  famotidine (PEPCID) 20 MG tablet Take 1 tablet (20 mg total) by mouth at bedtime. 01/25/19   Julianne Handler, CNM  fluconazole (DIFLUCAN) 200 MG tablet Take 1 tablet (200 mg total) by mouth once for 1 dose. May repeat in 72 hours if needed 11/27/19 11/27/19  Hall-Potvin, [place], PA-C  glycopyrrolate (ROBINUL) 1 MG tablet Take 1 tablet (1 mg total) by mouth 3 (three) times daily. For spitting 01/25/19   Julianne Handler, CNM  ondansetron (ZOFRAN) 8 MG tablet Take 1 tablet (8 mg total) by mouth every 8 (eight) hours as needed for nausea or vomiting. 12/20/18   Starr Lake, CNM  ondansetron (ZOFRAN-ODT) 8 MG disintegrating tablet Take 1 tablet (8 mg total) by mouth every 8 (eight) hours as needed for nausea or vomiting. 01/25/19   Julianne Handler, CNM  Prenatal MV-Min-FA-Omega-3 (PRENATAL GUMMIES/DHA & FA) 0.4-32.5 MG CHEW Chew 1 tablet by mouth daily. 12/20/18   Starr Lake, CNM  promethazine (PHENERGAN) 25 MG tablet Take 1 tablet (  25 mg total) by mouth every 6 (six) hours as needed for nausea or vomiting. 12/20/18   Starr Lake, CNM  scopolamine (TRANSDERM-SCOP, 1.5 MG,) 1 MG/3DAYS Place 1 patch (1.5 mg total) onto the skin every 3 (three) days. 01/25/19   Julianne Handler, CNM  triamcinolone (KENALOG) 0.025 % ointment Apply 1 application topically 2 (two) times daily. 12/23/18   Scot Jun, FNP    Family History Family History  Problem Relation Age of Onset  . Diabetes Father   . Cancer Father   . Hypertension Sister   . Diabetes Sister   . Glaucoma Brother   . Diabetes Brother   . Hypertension Brother     Social History Social History   Tobacco Use  . Smoking status: Never Smoker  . Smokeless tobacco: Never Used  Substance Use Topics  . Alcohol use: Not Currently    Comment: Occasional  . Drug use:  Not Currently    Types: Marijuana    Comment: Last Use beginning of May 2020     Allergies   Patient has no known allergies.   Review of Systems As per HPI   Physical Exam Triage Vital Signs ED Triage Vitals  Enc Vitals Group     BP      Pulse      Resp      Temp      Temp src      SpO2      Weight      Height      Head Circumference      Peak Flow      Pain Score      Pain Loc      Pain Edu?      Excl. in [place]?    No data found.  Updated Vital Signs BP 120/77   Pulse 86   Temp 98.7 F (37.1 C) (Oral)   Resp 18   LMP 11/12/2019   SpO2 98%   Visual Acuity Right Eye Distance:   Left Eye Distance:   Bilateral Distance:    Right Eye Near:   Left Eye Near:    Bilateral Near:     Physical Exam Constitutional:      General: She is not in acute distress. HENT:     Head: Normocephalic and atraumatic.  Eyes:     General: No scleral icterus.    Pupils: Pupils are equal, round, and reactive to light.  Cardiovascular:     Rate and Rhythm: Normal rate.  Pulmonary:     Effort: Pulmonary effort is normal.  Abdominal:     General: Bowel sounds are normal.     Palpations: Abdomen is soft.     Tenderness: There is no abdominal tenderness. There is no right CVA tenderness, left CVA tenderness or guarding.  Genitourinary:    Comments: Patient declined, self-swab performed Skin:    Coloration: Skin is not jaundiced or pale.  Neurological:     Mental Status: She is alert and oriented to person, place, and time.      UC Treatments / Results  Labs (all labs ordered are listed, but only abnormal results are displayed) Labs Reviewed  POCT URINALYSIS DIP (MANUAL ENTRY) - Abnormal; Notable for the following components:      Result Value   Spec Grav, UA >=1.030 (*)    Blood, UA large (*)    All other components within normal limits  POCT URINE PREGNANCY - Normal  POCT FASTING CBG KUC MANUAL  ENTRY - Normal  CERVICOVAGINAL ANCILLARY ONLY    EKG    Radiology No results found.  Procedures Procedures (including critical care time)  Medications Ordered in UC Medications - No data to display  Initial Impression / Assessment and Plan / UC Course  I have reviewed the triage vital signs and the nursing notes.  Pertinent labs & imaging results that were available during my care of the patient were reviewed by me and considered in my medical decision making (see chart for details).     Patient afebrile, nontoxic in office today.  CBG done in office: 94, Urine pregnancy negative.  Urine dipstick significant for persistent hematuria, elevated specific gravity.  Cytology pending: We will treat empirically for yeast vaginitis with Diflucan.  Patient to follow-up with PCP in 1-2 weeks for further evaluation if needed.  Had a contact information for urology, which PCP is recommended in the past for patient to schedule follow-up.  Return precautions discussed, patient verbalized understanding and is agreeable to plan. Final Clinical Impressions(s) / UC Diagnoses   Final diagnoses:  Urinary frequency  Acute vaginitis  Unprotected sex     Discharge Instructions     Testing for chlamydia, gonorrhea, trichomonas is pending: please look for these results on the MyChart app/website.  We will notify you if you are positive and outline treatment at that time.  Important to avoid all forms of sexual intercourse (oral, vaginal, anal) with any/all partners for the next 7 days to avoid spreading/reinfecting. Any/all sexual partners should be notified of testing/treatment today.  Return for persistent/worsening symptoms or if you develop fever, abdominal or pelvic pain, blood in your urine, or are re-exposed to an STI.    ED Prescriptions    Medication Sig Dispense Auth. Provider   fluconazole (DIFLUCAN) 200 MG tablet Take 1 tablet (200 mg total) by mouth once for 1 dose. May repeat in 72 hours if needed 2 tablet Hall-Potvin, [place], PA-C      PDMP not reviewed this encounter.   Hall-Potvin, [place], [place] 11/27/19 1231

## 2019-11-27 NOTE — Discharge Instructions (Signed)

## 2019-11-27 NOTE — ED Triage Notes (Signed)
Seen by provider

## 2019-11-29 LAB — CERVICOVAGINAL ANCILLARY ONLY
Bacterial Vaginitis (gardnerella): NEGATIVE
Candida Glabrata: NEGATIVE
Candida Vaginitis: NEGATIVE
Chlamydia: NEGATIVE
Comment: NEGATIVE
Comment: NEGATIVE
Comment: NEGATIVE
Comment: NEGATIVE
Comment: NEGATIVE
Comment: NORMAL
Neisseria Gonorrhea: NEGATIVE
Trichomonas: NEGATIVE

## 2019-12-11 ENCOUNTER — Ambulatory Visit
Admission: EM | Admit: 2019-12-11 | Discharge: 2019-12-11 | Disposition: A | Discharge status: Home / Self Care | Payer: Medicaid Other

## 2019-12-11 ENCOUNTER — Other Ambulatory Visit: Payer: Self-pay

## 2019-12-11 ENCOUNTER — Encounter: Payer: Self-pay | Admitting: *Deleted

## 2019-12-11 DIAGNOSIS — S61309A Unspecified open wound of unspecified finger with damage to nail, initial encounter: Secondary | ICD-10-CM
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
## 2019-12-11 NOTE — Discharge Instructions (Signed)
Keep fingernail covered. May apply ice to help with swelling, Tylenol ibuprofen for any pain. Nail should fall out in the next few days to weeks. Return for worsening pain, swelling, redness, smell, fever.
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
## 2020-01-16 ENCOUNTER — Ambulatory Visit
Admission: EM | Admit: 2020-01-16 | Discharge: 2020-01-16 | Disposition: A | Discharge status: Home / Self Care | Payer: Medicaid Other | Attending: Emergency Medicine | Admitting: Emergency Medicine

## 2020-01-16 ENCOUNTER — Other Ambulatory Visit: Payer: Self-pay

## 2020-01-16 ENCOUNTER — Encounter: Payer: Self-pay | Admitting: Emergency Medicine

## 2020-01-16 DIAGNOSIS — N898 Other specified noninflammatory disorders of vagina: Secondary | ICD-10-CM | POA: Diagnosis present

## 2020-01-16 LAB — POCT URINE PREGNANCY: Preg Test, Ur: NEGATIVE

## 2020-01-16 MED ORDER — METRONIDAZOLE 0.75 % VA GEL: 1.0000 | Freq: Two times a day (BID) | VAGINAL | 0 refills | Status: DC

## 2020-01-16 MED ORDER — FLUCONAZOLE 200 MG PO TABS: 200.0000 mg | ORAL_TABLET | Freq: Once | ORAL | 0 refills | Status: AC

## 2020-01-16 NOTE — ED Triage Notes (Signed)
Patient presents to [facility] for assessment of 2 weeks of vaginal discharge and malodor.  Patient denies pain during sex.  C/o lower abdominal pain.

## 2020-01-16 NOTE — Discharge Instructions (Signed)

## 2020-01-16 NOTE — ED Provider Notes (Signed)
[name]    CSN: [id number] Arrival date & time: 01/16/20  1647      History   Chief Complaint Chief Complaint  Patient presents with  . Vaginal Discharge    HPI Brenda West is a 34 y.o. female with history of BV presenting for 2-week course of vaginal discharge and malodor.  Patient is unable to describe odor.  Denies urinary symptoms such as frequency, urgency, dysuria.  Has had intermittent lower abdominal cramping.  LMP 7/6: Just finished.  No dyspareunia, back pain, fever.   Past Medical History:  Diagnosis Date  . Allergy   . Anxiety   . Breast mass   . Bronchitis   . BV (bacterial vaginosis)   . Depression    doing ok  . Eye irritation   . Headache   . Infection    UTI  . Seasonal allergies   . Tooth abscess   . Vaginal Pap smear, abnormal   . Vaginal yeast infection   . Yeast infection     Patient Active Problem List   Diagnosis Date Noted  . Cervical insufficiency during pregnancy, antepartum 02/14/2019  . Inevitable abortion 02/14/2019  . 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 Encounter for supervision of normal pregnancy, unspecified, unspecified trimester 01/04/2019    Past Surgical History:  Procedure Laterality Date  . HIP SURGERY    . WISDOM TOOTH EXTRACTION      OB History    Gravida  1   Para  0   Term  0   Preterm  0   AB  0   Living  0     SAB  0   TAB  0   Ectopic  0   Multiple  0   Live Births               Home Medications    Prior to Admission medications   Medication Sig Start Date End Date Taking? Authorizing Provider  sertraline (ZOLOFT) 25 MG tablet Take 25 mg by mouth daily.   Yes [provider]  Blood Pressure Monitoring KIT 1 kit by Does not apply route once a week. 01/13/19   Glenice Bow, DO  calcium carbonate (TUMS - DOSED IN MG ELEMENTAL CALCIUM) 500 MG chewable tablet Chew 1 tablet by mouth daily.    [provider]  famotidine (PEPCID) 20 MG tablet Take 1  tablet (20 mg total) by mouth at bedtime. 01/25/19   Julianne Handler, CNM  fluconazole (DIFLUCAN) 200 MG tablet Take 1 tablet (200 mg total) by mouth once for 1 dose. May repeat in 72 hours if needed 01/16/20 01/16/20  Hall-Potvin, [place], PA-C  glycopyrrolate (ROBINUL) 1 MG tablet Take 1 tablet (1 mg total) by mouth 3 (three) times daily. For spitting 01/25/19   Julianne Handler, CNM  metroNIDAZOLE (METROGEL) 0.75 % vaginal gel Place 1 Applicatorful vaginally 2 (two) times daily. 01/16/20   Hall-Potvin, [place], PA-C  Prenatal MV-Min-FA-Omega-3 (PRENATAL GUMMIES/DHA & FA) 0.4-32.5 MG CHEW Chew 1 tablet by mouth daily. 12/20/18   Starr Lake, CNM  SPIRONOLACTONE PO Take by mouth.    [provider]  triamcinolone (KENALOG) 0.025 % ointment Apply 1 application topically 2 (two) times daily. 12/23/18   Scot Jun, FNP  promethazine (PHENERGAN) 25 MG tablet Take 1 tablet (25 mg total) by mouth every 6 (six) hours as needed for nausea or vomiting. 12/20/18 01/16/20  Starr Lake, CNM    Family History Family History  Problem Relation Age of Onset  . Diabetes Father   . Cancer Father   . Hypertension Sister   . Diabetes Sister   . Glaucoma Brother   . Diabetes Brother   . Hypertension Brother     Social History Social History   Tobacco Use  . Smoking status: Never Smoker  . Smokeless tobacco: Never Used  Vaping Use  . Vaping Use: Never used  Substance Use Topics  . Alcohol use: Not Currently    Comment: Occasional  . Drug use: Not Currently    Types: Marijuana     Allergies   Patient has no known allergies.   Review of Systems As per HPI   Physical Exam Triage Vital Signs ED Triage Vitals  Enc Vitals Group     BP      Pulse      Resp      Temp      Temp src      SpO2      Weight      Height      Head Circumference      Peak Flow      Pain Score      Pain Loc      Pain Edu?      Excl. in [place]?    No data found.   Updated Vital Signs BP 125/79 (BP Location: Left Arm)   Pulse 75   Temp 98 F (36.7 C) (Oral)   Resp 18   LMP 01/10/2020   SpO2 97%   Visual Acuity Right Eye Distance:   Left Eye Distance:   Bilateral Distance:    Right Eye Near:   Left Eye Near:    Bilateral Near:     Physical Exam Constitutional:      General: She is not in acute distress. HENT:     Head: Normocephalic and atraumatic.  Eyes:     General: No scleral icterus.    Pupils: Pupils are equal, round, and reactive to light.  Cardiovascular:     Rate and Rhythm: Normal rate.  Pulmonary:     Effort: Pulmonary effort is normal.  Abdominal:     General: Bowel sounds are normal.     Palpations: Abdomen is soft.     Tenderness: There is no abdominal tenderness. There is no right CVA tenderness, left CVA tenderness or guarding.  Genitourinary:    Comments: Patient declined, self-swab performed Skin:    Coloration: Skin is not jaundiced or pale.  Neurological:     Mental Status: She is alert and oriented to person, place, and time.      UC Treatments / Results  Labs (all labs ordered are listed, but only abnormal results are displayed) Labs Reviewed  POCT URINE PREGNANCY - Normal  CERVICOVAGINAL ANCILLARY ONLY    EKG   Radiology No results found.  Procedures Procedures (including critical care time)  Medications Ordered in UC Medications - No data to display  Initial Impression / Assessment and Plan / UC Course  I have reviewed the triage vital signs and the nursing notes.  Pertinent labs & imaging results that were available during my care of the patient were reviewed by me and considered in my medical decision making (see chart for details).     Urine pregnancy negative.  Cytology pending: Will treat for BV as patient states this feels similar.  Return precautions discussed, patient verbalized understanding and is agreeable to plan. Final Clinical Impressions(s) / UC Diagnoses  Final  diagnoses:  Vaginal discharge     Discharge Instructions     Testing for chlamydia, gonorrhea, trichomonas is pending: please look for these results on the MyChart app/website.  We will notify you if you are positive and outline treatment at that time.  Important to avoid all forms of sexual intercourse (oral, vaginal, anal) with any/all partners for the next 7 days to avoid spreading/reinfecting. Any/all sexual partners should be notified of testing/treatment today.  Return for persistent/worsening symptoms or if you develop fever, abdominal or pelvic pain, blood in your urine, or are re-exposed to an STI.    ED Prescriptions    Medication Sig Dispense Auth. Provider   fluconazole (DIFLUCAN) 200 MG tablet Take 1 tablet (200 mg total) by mouth once for 1 dose. May repeat in 72 hours if needed 2 tablet Hall-Potvin, [place], PA-C   metroNIDAZOLE (METROGEL) 0.75 % vaginal gel Place 1 Applicatorful vaginally 2 (two) times daily. 70 g Hall-Potvin, [place], PA-C     PDMP not reviewed this encounter.   Hall-Potvin, [place], [place] 01/16/20 1821

## 2020-01-18 LAB — CERVICOVAGINAL ANCILLARY ONLY
Chlamydia: NEGATIVE
Comment: NEGATIVE
Comment: NEGATIVE
Comment: NORMAL
Neisseria Gonorrhea: NEGATIVE
Trichomonas: NEGATIVE

## 2020-03-29 ENCOUNTER — Inpatient Hospital Stay (HOSPITAL_COMMUNITY)
Admission: AD | Admit: 2020-03-29 | Discharge: 2020-03-30 | Disposition: A | Discharge status: Home / Self Care | Payer: Medicaid Other | Attending: Obstetrics and Gynecology | Admitting: Obstetrics and Gynecology

## 2020-03-29 DIAGNOSIS — O219 Vomiting of pregnancy, unspecified: Secondary | ICD-10-CM

## 2020-03-29 DIAGNOSIS — F419 Anxiety disorder, unspecified: Secondary | ICD-10-CM | POA: Insufficient documentation

## 2020-03-29 DIAGNOSIS — O26891 Other specified pregnancy related conditions, first trimester: Secondary | ICD-10-CM | POA: Insufficient documentation

## 2020-03-29 DIAGNOSIS — R102 Pelvic and perineal pain unspecified side: Secondary | ICD-10-CM

## 2020-03-29 DIAGNOSIS — Z3A01 Less than 8 weeks gestation of pregnancy: Secondary | ICD-10-CM | POA: Insufficient documentation

## 2020-03-29 DIAGNOSIS — R109 Unspecified abdominal pain: Secondary | ICD-10-CM | POA: Insufficient documentation

## 2020-03-29 DIAGNOSIS — O99341 Other mental disorders complicating pregnancy, first trimester: Secondary | ICD-10-CM | POA: Insufficient documentation

## 2020-03-29 DIAGNOSIS — O3680X0 Pregnancy with inconclusive fetal viability, not applicable or unspecified: Secondary | ICD-10-CM

## 2020-03-29 DIAGNOSIS — Z79899 Other long term (current) drug therapy: Secondary | ICD-10-CM | POA: Insufficient documentation

## 2020-03-29 DIAGNOSIS — F329 Major depressive disorder, single episode, unspecified: Secondary | ICD-10-CM | POA: Insufficient documentation

## 2020-03-30 ENCOUNTER — Encounter (HOSPITAL_COMMUNITY): Payer: Self-pay | Admitting: Obstetrics and Gynecology

## 2020-03-30 ENCOUNTER — Inpatient Hospital Stay (HOSPITAL_COMMUNITY): Payer: Medicaid Other

## 2020-03-30 ENCOUNTER — Other Ambulatory Visit: Payer: Self-pay

## 2020-03-30 ENCOUNTER — Telehealth: Payer: Self-pay | Admitting: Certified Nurse Midwife

## 2020-03-30 DIAGNOSIS — O26891 Other specified pregnancy related conditions, first trimester: Secondary | ICD-10-CM

## 2020-03-30 DIAGNOSIS — O219 Vomiting of pregnancy, unspecified: Secondary | ICD-10-CM

## 2020-03-30 DIAGNOSIS — F329 Major depressive disorder, single episode, unspecified: Secondary | ICD-10-CM | POA: Diagnosis not present

## 2020-03-30 DIAGNOSIS — Z3A01 Less than 8 weeks gestation of pregnancy: Secondary | ICD-10-CM

## 2020-03-30 DIAGNOSIS — R109 Unspecified abdominal pain: Secondary | ICD-10-CM

## 2020-03-30 DIAGNOSIS — F419 Anxiety disorder, unspecified: Secondary | ICD-10-CM | POA: Diagnosis not present

## 2020-03-30 DIAGNOSIS — Z79899 Other long term (current) drug therapy: Secondary | ICD-10-CM | POA: Diagnosis not present

## 2020-03-30 DIAGNOSIS — O99341 Other mental disorders complicating pregnancy, first trimester: Secondary | ICD-10-CM | POA: Diagnosis not present

## 2020-03-30 LAB — URINALYSIS, ROUTINE W REFLEX MICROSCOPIC
Bilirubin Urine: NEGATIVE
Glucose, UA: 250 mg/dL — AB
Ketones, ur: NEGATIVE mg/dL
Nitrite: NEGATIVE
Protein, ur: NEGATIVE mg/dL
Specific Gravity, Urine: >1.03 — ABNORMAL HIGH (ref 1.005–1.030)
pH: 6 (ref 5.0–8.0)

## 2020-03-30 LAB — URINALYSIS, MICROSCOPIC (REFLEX)

## 2020-03-30 MED ORDER — PROMETHAZINE HCL 25 MG PO TABS: 25.0000 mg | ORAL_TABLET | Freq: Four times a day (QID) | ORAL | 1 refills | Status: DC | PRN

## 2020-03-30 MED ORDER — PANTOPRAZOLE SODIUM 20 MG PO TBEC: 20.0000 mg | DELAYED_RELEASE_TABLET | Freq: Every day | ORAL | 0 refills | Status: DC

## 2020-03-30 MED ORDER — SCOPOLAMINE 1 MG/3DAYS TD PT72
1.0000 | MEDICATED_PATCH | Freq: Once | TRANSDERMAL | Status: DC
  Administered 2020-03-30: 1.5 mg via TRANSDERMAL
  Filled 2020-03-30: qty 1

## 2020-03-30 MED ORDER — SCOPOLAMINE 1 MG/3DAYS TD PT72: 1.0000 | MEDICATED_PATCH | TRANSDERMAL | 0 refills | Status: DC
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
## 2020-03-30 NOTE — MAU Note (Signed)
Started antibiotics for uti yesterday. Today having n/v and abd pain. Getting care at [facility] due to being high risk. Hx 16wk delivery. LMP 02/08/20. Constipation and used enema few nights ago that helped but unable to eat much since then. States when I used enema had blood on stool but is sure was from vagina.

## 2020-03-30 NOTE — Telephone Encounter (Signed)
Pt cannot get scopolamine patches until Monday and the one placed overnight by Wynelle Bourgeois, CNM has come off. Would like a different prescription for nausea to take until the patches arrive to her pharmacy. Phenergan sent for pt.

## 2020-03-30 NOTE — Discharge Instructions (Signed)
First Trimester of Pregnancy   Due Date 12/18/20 The first trimester of pregnancy is from week 1 until the end of week 13 (months 1 through 3). A week after a sperm fertilizes an egg, the egg will implant on the wall of the uterus. This embryo will begin to develop into a baby. Genes from you and your partner will form the baby. The female genes will determine whether the baby will be a boy or a girl. At 6-8 weeks, the eyes and face will be formed, and the heartbeat can be seen on ultrasound. At the end of 12 weeks, all the baby's organs will be formed. Now that you are pregnant, you will want to do everything you can to have a healthy baby. Two of the most important things are to get good prenatal care and to follow your health care provider's instructions. Prenatal care is all the medical care you receive before the baby's birth. This care will help prevent, find, and treat any problems during the pregnancy and childbirth. Body changes during your first trimester Your body goes through many changes during pregnancy. The changes vary from woman to woman.  You may gain or lose a couple of pounds at first.  You may feel sick to your stomach (nauseous) and you may throw up (vomit). If the vomiting is uncontrollable, call your health care provider.  You may tire easily.  You may develop headaches that can be relieved by medicines. All medicines should be approved by your health care provider.  You may urinate more often. Painful urination may mean you have a bladder infection.  You may develop heartburn as a result of your pregnancy.  You may develop constipation because certain hormones are causing the muscles that push stool through your intestines to slow down.  You may develop hemorrhoids or swollen veins (varicose veins).  Your breasts may begin to grow larger and become tender. Your nipples may stick out more, and the tissue that surrounds them (areola) may become darker.  Your gums may bleed  and may be sensitive to brushing and flossing.  Dark spots or blotches (chloasma, mask of pregnancy) may develop on your face. This will likely fade after the baby is born.  Your menstrual periods will stop.  You may have a loss of appetite.  You may develop cravings for certain kinds of food.  You may have changes in your emotions from day to day, such as being excited to be pregnant or being concerned that something may go wrong with the pregnancy and baby.  You may have more vivid and strange dreams.  You may have changes in your hair. These can include thickening of your hair, rapid growth, and changes in texture. Some women also have hair loss during or after pregnancy, or hair that feels dry or thin. Your hair will most likely return to normal after your baby is born. What to expect at prenatal visits During a routine prenatal visit:  You will be weighed to make sure you and the baby are growing normally.  Your blood pressure will be taken.  Your abdomen will be measured to track your baby's growth.  The fetal heartbeat will be listened to between weeks 10 and 14 of your pregnancy.  Test results from any previous visits will be discussed. Your health care provider may ask you:  How you are feeling.  If you are feeling the baby move.  If you have had any abnormal symptoms, such as leaking fluid,  bleeding, severe headaches, or abdominal cramping.  If you are using any tobacco products, including cigarettes, chewing tobacco, and electronic cigarettes.  If you have any questions. Other tests that may be performed during your first trimester include:  Blood tests to find your blood type and to check for the presence of any previous infections. The tests will also be used to check for low iron levels (anemia) and protein on red blood cells (Rh antibodies). Depending on your risk factors, or if you previously had diabetes during pregnancy, you may have tests to check for high  blood sugar that affects pregnant women (gestational diabetes).  Urine tests to check for infections, diabetes, or protein in the urine.  An ultrasound to confirm the proper growth and development of the baby.  Fetal screens for spinal cord problems (spina bifida) and Down syndrome.  HIV (human immunodeficiency virus) testing. Routine prenatal testing includes screening for HIV, unless you choose not to have this test.  You may need other tests to make sure you and the baby are doing well. Follow these instructions at home: Medicines  Follow your health care provider's instructions regarding medicine use. Specific medicines may be either safe or unsafe to take during pregnancy.  Take a prenatal vitamin that contains at least 600 micrograms (mcg) of folic acid.  If you develop constipation, try taking a stool softener if your health care provider approves. Eating and drinking   Eat a balanced diet that includes fresh fruits and vegetables, whole grains, good sources of protein such as meat, eggs, or tofu, and low-fat dairy. Your health care provider will help you determine the amount of weight gain that is right for you.  Avoid raw meat and uncooked cheese. These carry germs that can cause birth defects in the baby.  Eating four or five small meals rather than three large meals a day may help relieve nausea and vomiting. If you start to feel nauseous, eating a few soda crackers can be helpful. Drinking liquids between meals, instead of during meals, also seems to help ease nausea and vomiting.  Limit foods that are high in fat and processed sugars, such as fried and sweet foods.  To prevent constipation: ? Eat foods that are high in fiber, such as fresh fruits and vegetables, whole grains, and beans. ? Drink enough fluid to keep your urine clear or pale yellow. Activity  Exercise only as directed by your health care provider. Most women can continue their usual exercise routine  during pregnancy. Try to exercise for 30 minutes at least 5 days a week. Exercising will help you: ? Control your weight. ? Stay in shape. ? Be prepared for labor and delivery.  Experiencing pain or cramping in the lower abdomen or lower back is a good sign that you should stop exercising. Check with your health care provider before continuing with normal exercises.  Try to avoid standing for long periods of time. Move your legs often if you must stand in one place for a long time.  Avoid heavy lifting.  Wear low-heeled shoes and practice good posture.  You may continue to have sex unless your health care provider tells you not to. Relieving pain and discomfort  Wear a good support bra to relieve breast tenderness.  Take warm sitz baths to soothe any pain or discomfort caused by hemorrhoids. Use hemorrhoid cream if your health care provider approves.  Rest with your legs elevated if you have leg cramps or low back pain.  If  you develop varicose veins in your legs, wear support hose. Elevate your feet for 15 minutes, 3-4 times a day. Limit salt in your diet. Prenatal care  Schedule your prenatal visits by the twelfth week of pregnancy. They are usually scheduled monthly at first, then more often in the last 2 months before delivery.  Write down your questions. Take them to your prenatal visits.  Keep all your prenatal visits as told by your health care provider. This is important. Safety  Wear your seat belt at all times when driving.  Make a list of emergency phone numbers, including numbers for family, friends, the hospital, and police and fire departments. General instructions  Ask your health care provider for a referral to a local prenatal education class. Begin classes no later than the beginning of month 6 of your pregnancy.  Ask for help if you have counseling or nutritional needs during pregnancy. Your health care provider can offer advice or refer you to specialists  for help with various needs.  Do not use hot tubs, steam rooms, or saunas.  Do not douche or use tampons or scented sanitary pads.  Do not cross your legs for long periods of time.  Avoid cat litter boxes and soil used by cats. These carry germs that can cause birth defects in the baby and possibly loss of the fetus by miscarriage or stillbirth.  Avoid all smoking, herbs, alcohol, and medicines not prescribed by your health care provider. Chemicals in these products affect the formation and growth of the baby.  Do not use any products that contain nicotine or tobacco, such as cigarettes and e-cigarettes. If you need help quitting, ask your health care provider. You may receive counseling support and other resources to help you quit.  Schedule a dentist appointment. At home, brush your teeth with a soft toothbrush and be gentle when you floss. Contact a health care provider if:  You have dizziness.  You have mild pelvic cramps, pelvic pressure, or nagging pain in the abdominal area.  You have persistent nausea, vomiting, or diarrhea.  You have a bad smelling vaginal discharge.  You have pain when you urinate.  You notice increased swelling in your face, hands, legs, or ankles.  You are exposed to fifth disease or chickenpox.  You are exposed to [place] measles (rubella) and have never had it. Get help right away if:  You have a fever.  You are leaking fluid from your vagina.  You have spotting or bleeding from your vagina.  You have severe abdominal cramping or pain.  You have rapid weight gain or loss.  You vomit blood or material that looks like coffee grounds.  You develop a severe headache.  You have shortness of breath.  You have any kind of trauma, such as from a fall or a car accident. Summary  The first trimester of pregnancy is from week 1 until the end of week 13 (months 1 through 3).  Your body goes through many changes during pregnancy. The changes  vary from woman to woman.  You will have routine prenatal visits. During those visits, your health care provider will examine you, discuss any test results you may have, and talk with you about how you are feeling. This information is not intended to replace advice given to you by your health care provider. Make sure you discuss any questions you have with your health care provider. Document Revised: 06/05/2017 Document Reviewed: 06/04/2016 Elsevier Patient Education  2020 [employer] Morning  Sickness  Morning sickness is when a woman feels nauseous during pregnancy. This nauseous feeling may or may not come with vomiting. It often occurs in the morning, but it can be a problem at any time of day. Morning sickness is most common during the first trimester. In some cases, it may continue throughout pregnancy. Although morning sickness is unpleasant, it is usually harmless unless the woman develops severe and continual vomiting (hyperemesis gravidarum), a condition that requires more intense treatment. What are the causes? The exact cause of this condition is not known, but it seems to be related to normal hormonal changes that occur in pregnancy. What increases the risk? You are more likely to develop this condition if:  You experienced nausea or vomiting before your pregnancy.  You had morning sickness during a previous pregnancy.  You are pregnant with more than one baby, such as twins. What are the signs or symptoms? Symptoms of this condition include:  Nausea.  Vomiting. How is this diagnosed? This condition is usually diagnosed based on your signs and symptoms. How is this treated? In many cases, treatment is not needed for this condition. Making some changes to what you eat may help to control symptoms. Your health care provider may also prescribe or recommend:  Vitamin B6 supplements.  Anti-nausea medicines.  Ginger. Follow these instructions at home: Medicines  Take  over-the-counter and prescription medicines only as told by your health care provider. Do not use any prescription, over-the-counter, or herbal medicines for morning sickness without first talking with your health care provider.  Taking multivitamins before getting pregnant can prevent or decrease the severity of morning sickness in most women. Eating and drinking  Eat a piece of dry toast or crackers before getting out of bed in the morning.  Eat 5 or 6 small meals a day.  Eat dry and bland foods, such as rice or a baked potato. Foods that are high in carbohydrates are often helpful.  Avoid greasy, fatty, and spicy foods.  Have someone cook for you if the smell of any food causes nausea and vomiting.  If you feel nauseous after taking prenatal vitamins, take the vitamins at night or with a snack.  Snack on protein foods between meals if you are hungry. Nuts, yogurt, and cheese are good options.  Drink fluids throughout the day.  Try ginger ale made with real ginger, ginger tea made from fresh grated ginger, or ginger candies. General instructions  Do not use any products that contain nicotine or tobacco, such as cigarettes and e-cigarettes. If you need help quitting, ask your health care provider.  Get an air purifier to keep the air in your house free of odors.  Get plenty of fresh air.  Try to avoid odors that trigger your nausea.  Consider trying these methods to help relieve symptoms: ? Wearing an acupressure wristband. These wristbands are often worn for seasickness. ? Acupuncture. Contact a health care provider if:  Your home remedies are not working and you need medicine.  You feel dizzy or light-headed.  You are losing weight. Get help right away if:  You have persistent and uncontrolled nausea and vomiting.  You faint.  You have severe pain in your abdomen. Summary  Morning sickness is when a woman feels nauseous during pregnancy. This nauseous feeling may  or may not come with vomiting.  Morning sickness is most common during the first trimester.  It often occurs in the morning, but it can be a problem at  any time of day.  In many cases, treatment is not needed for this condition. Making some changes to what you eat may help to control symptoms. This information is not intended to replace advice given to you by your health care provider. Make sure you discuss any questions you have with your health care provider. Document Revised: 06/05/2017 Document Reviewed: 07/26/2016 Elsevier Patient Education  2020 [employer]

## 2020-04-03 ENCOUNTER — Other Ambulatory Visit: Payer: Self-pay

## 2020-04-03 ENCOUNTER — Inpatient Hospital Stay (HOSPITAL_COMMUNITY)
Admission: AD | Admit: 2020-04-03 | Discharge: 2020-04-03 | Disposition: A | Discharge status: Home / Self Care | Payer: Medicaid Other | Attending: Obstetrics and Gynecology | Admitting: Obstetrics and Gynecology

## 2020-04-03 ENCOUNTER — Inpatient Hospital Stay (HOSPITAL_COMMUNITY): Payer: Medicaid Other

## 2020-04-03 ENCOUNTER — Encounter (HOSPITAL_COMMUNITY): Payer: Self-pay | Admitting: Obstetrics and Gynecology

## 2020-04-03 DIAGNOSIS — O210 Mild hyperemesis gravidarum: Secondary | ICD-10-CM | POA: Insufficient documentation

## 2020-04-03 DIAGNOSIS — O26891 Other specified pregnancy related conditions, first trimester: Secondary | ICD-10-CM | POA: Diagnosis present

## 2020-04-03 DIAGNOSIS — O99341 Other mental disorders complicating pregnancy, first trimester: Secondary | ICD-10-CM | POA: Diagnosis not present

## 2020-04-03 DIAGNOSIS — K5901 Slow transit constipation: Secondary | ICD-10-CM | POA: Insufficient documentation

## 2020-04-03 DIAGNOSIS — F329 Major depressive disorder, single episode, unspecified: Secondary | ICD-10-CM | POA: Insufficient documentation

## 2020-04-03 DIAGNOSIS — O219 Vomiting of pregnancy, unspecified: Secondary | ICD-10-CM | POA: Diagnosis not present

## 2020-04-03 DIAGNOSIS — O99611 Diseases of the digestive system complicating pregnancy, first trimester: Secondary | ICD-10-CM

## 2020-04-03 DIAGNOSIS — R109 Unspecified abdominal pain: Secondary | ICD-10-CM | POA: Diagnosis not present

## 2020-04-03 DIAGNOSIS — Z3A01 Less than 8 weeks gestation of pregnancy: Secondary | ICD-10-CM | POA: Insufficient documentation

## 2020-04-03 DIAGNOSIS — Z79899 Other long term (current) drug therapy: Secondary | ICD-10-CM | POA: Diagnosis not present

## 2020-04-03 DIAGNOSIS — F419 Anxiety disorder, unspecified: Secondary | ICD-10-CM | POA: Diagnosis not present

## 2020-04-03 LAB — URINALYSIS, ROUTINE W REFLEX MICROSCOPIC
Bacteria, UA: NONE SEEN
Bilirubin Urine: NEGATIVE
Glucose, UA: NEGATIVE mg/dL
Ketones, ur: NEGATIVE mg/dL
Nitrite: NEGATIVE
Protein, ur: NEGATIVE mg/dL
Specific Gravity, Urine: 1.023 (ref 1.005–1.030)
pH: 5 (ref 5.0–8.0)

## 2020-04-03 LAB — CBC
HCT: 37.6 % (ref 36.0–46.0)
Hemoglobin: 12.4 g/dL (ref 12.0–15.0)
MCH: 27 pg (ref 26.0–34.0)
MCHC: 33 g/dL (ref 30.0–36.0)
MCV: 81.7 fL (ref 80.0–100.0)
Platelets: 395 10*3/uL (ref 150–400)
RBC: 4.6 MIL/uL (ref 3.87–5.11)
RDW: 13.9 % (ref 11.5–15.5)
WBC: 9.4 10*3/uL (ref 4.0–10.5)
nRBC: 0 % (ref 0.0–0.2)

## 2020-04-03 LAB — WET PREP, GENITAL
Sperm: NONE SEEN
Trich, Wet Prep: NONE SEEN
Yeast Wet Prep HPF POC: NONE SEEN

## 2020-04-03 LAB — HCG, QUANTITATIVE, PREGNANCY: hCG, Beta Chain, Quant, S: 240013 m[IU]/mL — ABNORMAL HIGH (ref <5)

## 2020-04-03 LAB — CULTURE, OB URINE

## 2020-04-03 LAB — HIV ANTIBODY (ROUTINE TESTING W REFLEX): HIV Screen 4th Generation wRfx: NONREACTIVE

## 2020-04-03 MED ORDER — GLYCERIN (LAXATIVE) 2.1 G RE SUPP
1.0000 | Freq: Once | RECTAL | Status: AC
  Administered 2020-04-03: 1 via RECTAL
  Filled 2020-04-03: qty 1

## 2020-04-03 MED ORDER — POLYETHYLENE GLYCOL 3350 17 GM/SCOOP PO POWD: 17.0000 g | Freq: Two times a day (BID) | ORAL | 0 refills | Status: DC

## 2020-04-03 MED ORDER — M.V.I. ADULT IV INJ
Freq: Once | INTRAVENOUS | Status: AC
  Filled 2020-04-03: qty 1000

## 2020-04-03 MED ORDER — PROMETHAZINE HCL 25 MG/ML IJ SOLN
25.0000 mg | Freq: Once | INTRAVENOUS | Status: AC
  Administered 2020-04-03: 25 mg via INTRAVENOUS
  Filled 2020-04-03: qty 1

## 2020-04-03 NOTE — Discharge Instructions (Signed)
Morning Sickness  Morning sickness is when you feel sick to your stomach (nauseous) during pregnancy. You may feel sick to your stomach and throw up (vomit). You may feel sick in the morning, but you can feel this way at any time of day. Some women feel very sick to their stomach and cannot stop throwing up (hyperemesis gravidarum). Follow these instructions at home: Medicines  Take over-the-counter and prescription medicines only as told by your doctor. Do not take any medicines until you talk with your doctor about them first.  Taking multivitamins before getting pregnant can stop or lessen the harshness of morning sickness. Eating and drinking  Eat dry toast or crackers before getting out of bed.  Eat 5 or 6 small meals a day.  Eat dry and bland foods like rice and baked potatoes.  Do not eat greasy, fatty, or spicy foods.  Have someone cook for you if the smell of food causes you to feel sick or throw up.  If you feel sick to your stomach after taking prenatal vitamins, take them at night or with a snack.  Eat protein when you need a snack. Nuts, yogurt, and cheese are good choices.  Drink fluids throughout the day.  Try ginger ale made with real ginger, ginger tea made from fresh grated ginger, or ginger candies. General instructions  Do not use any products that have nicotine or tobacco in them, such as cigarettes and e-cigarettes. If you need help quitting, ask your doctor.  Use an air purifier to keep the air in your house free of smells.  Get lots of fresh air.  Try to avoid smells that make you feel sick.  Try: ? Wearing a bracelet that is used for seasickness (acupressure wristband). ? Going to a doctor who puts thin needles into certain body points (acupuncture) to improve how you feel. Contact a doctor if:  You need medicine to feel better.  You feel dizzy or light-headed.  You are losing weight. Get help right away if:  You feel very sick to your  stomach and cannot stop throwing up.  You pass out (faint).  You have very bad pain in your belly. Summary  Morning sickness is when you feel sick to your stomach (nauseous) during pregnancy.  You may feel sick in the morning, but you can feel this way at any time of day.  Making some changes to what you eat may help your symptoms go away. This information is not intended to replace advice given to you by your health care provider. Make sure you discuss any questions you have with your health care provider. Document Revised: 06/05/2017 Document Reviewed: 07/24/2016 Elsevier Patient Education  2020 [employer].   Constipation, Adult Constipation is when a person:  Poops (has a bowel movement) fewer times in a week than normal.  Has a hard time pooping.  Has poop that is dry, hard, or bigger than normal. Follow these instructions at home: Eating and drinking   Eat foods that have a lot of fiber, such as: ? Fresh fruits and vegetables. ? Whole grains. ? Beans.  Eat less of foods that are high in fat, low in fiber, or overly processed, such as: ? [place] fries. ? Hamburgers. ? Cookies. ? Candy. ? Soda.  Drink enough fluid to keep your pee (urine) clear or pale yellow. General instructions  Exercise regularly or as told by your doctor.  Go to the restroom when you feel like you need to poop. Do  Do not hold it in.  Take over-the-counter and prescription medicines only as told by your doctor. These include any fiber supplements.  Do pelvic floor retraining exercises, such as: ? Doing deep breathing while relaxing your lower belly (abdomen). ? Relaxing your pelvic floor while pooping.  Watch your condition for any changes.  Keep all follow-up visits as told by your doctor. This is important. Contact a doctor if:  You have pain that gets worse.  You have a fever.  You have not pooped for 4 days.  You throw up (vomit).  You are not hungry.  You lose  weight.  You are bleeding from the anus.  You have thin, pencil-like poop (stool). Get help right away if:  You have a fever, and your symptoms suddenly get worse.  You leak poop or have blood in your poop.  Your belly feels hard or bigger than normal (is bloated).  You have very bad belly pain.  You feel dizzy or you faint. This information is not intended to replace advice given to you by your health care provider. Make sure you discuss any questions you have with your health care provider. Document Revised: 06/05/2017 Document Reviewed: 12/12/2015 Elsevier Patient Education  2020 [employer] Inc.   
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
## 2020-04-03 NOTE — MAU Note (Signed)
Pt reports pain on right side when she lies on that side. Vomiting and can't keep anything down. Also reports she is constipated, used an enema at home yesterday but only small results.

## 2020-04-04 LAB — GC/CHLAMYDIA PROBE AMP (~~LOC~~) NOT AT ARMC
Chlamydia: NEGATIVE
Comment: NEGATIVE
Comment: NORMAL
Neisseria Gonorrhea: NEGATIVE

## 2020-04-11 ENCOUNTER — Inpatient Hospital Stay (HOSPITAL_COMMUNITY)
Admission: AD | Admit: 2020-04-11 | Discharge: 2020-04-11 | Disposition: A | Discharge status: Home / Self Care | Payer: Medicaid Other | Attending: Obstetrics and Gynecology | Admitting: Obstetrics and Gynecology

## 2020-04-11 ENCOUNTER — Encounter (HOSPITAL_COMMUNITY): Payer: Self-pay | Admitting: Obstetrics and Gynecology

## 2020-04-11 ENCOUNTER — Other Ambulatory Visit: Payer: Self-pay

## 2020-04-11 ENCOUNTER — Telehealth: Payer: Self-pay | Admitting: Advanced Practice Midwife

## 2020-04-11 DIAGNOSIS — Z3A08 8 weeks gestation of pregnancy: Secondary | ICD-10-CM | POA: Diagnosis not present

## 2020-04-11 DIAGNOSIS — F419 Anxiety disorder, unspecified: Secondary | ICD-10-CM | POA: Insufficient documentation

## 2020-04-11 DIAGNOSIS — O99281 Endocrine, nutritional and metabolic diseases complicating pregnancy, first trimester: Secondary | ICD-10-CM | POA: Diagnosis not present

## 2020-04-11 DIAGNOSIS — F329 Major depressive disorder, single episode, unspecified: Secondary | ICD-10-CM | POA: Diagnosis not present

## 2020-04-11 DIAGNOSIS — E860 Dehydration: Secondary | ICD-10-CM

## 2020-04-11 DIAGNOSIS — Z79899 Other long term (current) drug therapy: Secondary | ICD-10-CM | POA: Insufficient documentation

## 2020-04-11 DIAGNOSIS — O219 Vomiting of pregnancy, unspecified: Secondary | ICD-10-CM | POA: Diagnosis not present

## 2020-04-11 DIAGNOSIS — O99341 Other mental disorders complicating pregnancy, first trimester: Secondary | ICD-10-CM | POA: Diagnosis not present

## 2020-04-11 LAB — COMPREHENSIVE METABOLIC PANEL
ALT: 13 U/L (ref 0–44)
AST: 12 U/L — ABNORMAL LOW (ref 15–41)
Albumin: 3.5 g/dL (ref 3.5–5.0)
Alkaline Phosphatase: 42 U/L (ref 38–126)
Anion gap: 10 (ref 5–15)
BUN: 7 mg/dL (ref 6–20)
CO2: 21 mmol/L — ABNORMAL LOW (ref 22–32)
Calcium: 9.4 mg/dL (ref 8.9–10.3)
Chloride: 103 mmol/L (ref 98–111)
Creatinine, Ser: 0.81 mg/dL (ref 0.44–1.00)
GFR calc non Af Amer: >60 mL/min (ref >60)
Glucose, Bld: 92 mg/dL (ref 70–99)
Potassium: 3.7 mmol/L (ref 3.5–5.1)
Sodium: 134 mmol/L — ABNORMAL LOW (ref 135–145)
Total Bilirubin: 0.1 mg/dL — ABNORMAL LOW (ref 0.3–1.2)
Total Protein: 7.6 g/dL (ref 6.5–8.1)

## 2020-04-11 LAB — URINALYSIS, ROUTINE W REFLEX MICROSCOPIC
Bilirubin Urine: NEGATIVE
Glucose, UA: >=500 mg/dL — AB
Ketones, ur: 5 mg/dL — AB
Nitrite: NEGATIVE
Protein, ur: NEGATIVE mg/dL
Specific Gravity, Urine: 1.016 (ref 1.005–1.030)
pH: 6 (ref 5.0–8.0)

## 2020-04-11 MED ORDER — ONDANSETRON HCL 4 MG/2ML IJ SOLN
4.0000 mg | Freq: Once | INTRAMUSCULAR | Status: AC
  Administered 2020-04-11: 4 mg via INTRAVENOUS
  Filled 2020-04-11: qty 2

## 2020-04-11 MED ORDER — M.V.I. ADULT IV INJ
Freq: Once | INTRAVENOUS | Status: AC
  Filled 2020-04-11: qty 10

## 2020-04-11 MED ORDER — LACTATED RINGERS IV SOLN: Freq: Once | INTRAVENOUS | Status: DC

## 2020-04-11 MED ORDER — LACTATED RINGERS IV SOLN: Freq: Once | INTRAVENOUS | Status: AC

## 2020-04-11 MED ORDER — DEXAMETHASONE SODIUM PHOSPHATE 10 MG/ML IJ SOLN
10.0000 mg | Freq: Once | INTRAMUSCULAR | Status: AC
  Administered 2020-04-11: 10 mg via INTRAVENOUS
  Filled 2020-04-11: qty 1

## 2020-04-11 MED ORDER — PYRIDOXINE HCL 100 MG/ML IJ SOLN
100.0000 mg | Freq: Once | INTRAMUSCULAR | Status: AC
  Administered 2020-04-11: 100 mg via INTRAVENOUS
  Filled 2020-04-11: qty 1

## 2020-04-11 NOTE — MAU Provider Note (Addendum)
Chief Complaint: Morning Sickness   First Provider Initiated Contact with Patient 04/11/20 0142        SUBJECTIVE HPI: Brenda West is a 34 y.o. G2P0010 at 37w4dby LMP who presents to maternity admissions reporting nausea and vomiting.  States cannot keep anything down. Was seen for this last week and prescribed Robinul, Protonix, Phenergan and scopolamine. States none of them help but has not taken them recently  . She denies vaginal bleeding, vaginal itching/burning, urinary symptoms, h/a, dizziness, or fever/chills.    Emesis  This is a recurrent problem. The current episode started in the past 7 days. The problem has been unchanged. There has been no fever. Pertinent negatives include no abdominal pain, chills, diarrhea, dizziness, fever, headaches or myalgias. She has tried nothing for the symptoms.   RN Note: Brenda WINNIEis a 34y.o. at 876w4dere in MAU reporting: nausea and vomiting for the last week. Has not been able to keep anything down for a week. She states that shes thrown up x5 in the past 24 hours. No Vb, does endorse abdominal pain and constant spitting.  Pain score: 8  Past Medical History:  Diagnosis Date  . Allergy   . Anxiety   . Breast mass   . Bronchitis   . BV (bacterial vaginosis)   . Depression    doing ok  . Eye irritation   . Headache   . Infection    UTI  . Seasonal allergies   . Tooth abscess   . Vaginal Pap smear, abnormal   . Vaginal yeast infection   . Yeast infection    Past Surgical History:  Procedure Laterality Date  . HIP SURGERY    . WISDOM TOOTH EXTRACTION     Social History   Socioeconomic History  . Marital status: Single    Spouse name: Not on file  . Number of children: Not on file  . Years of education: Not on file  . Highest education level: Not on file  Occupational History  . Not on file  Tobacco Use  . Smoking status: Never Smoker  . Smokeless tobacco: Never Used  Vaping Use  . Vaping Use: Never used   Substance and Sexual Activity  . Alcohol use: Not Currently    Comment: Occasional  . Drug use: Not Currently    Types: Marijuana  . Sexual activity: Yes  Other Topics Concern  . Not on file  Social History Narrative   ** Merged History Encounter **       Social Determinants of Health   Financial Resource Strain:   . Difficulty of Paying Living Expenses: Not on file  Food Insecurity:   . Worried About RuCharity fundraisern the Last Year: Not on file  . Ran Out of Food in the Last Year: Not on file  Transportation Needs:   . Lack of Transportation (Medical): Not on file  . Lack of Transportation (Non-Medical): Not on file  Physical Activity:   . Days of Exercise per Week: Not on file  . Minutes of Exercise per Session: Not on file  Stress:   . Feeling of Stress : Not on file  Social Connections:   . Frequency of Communication with Friends and Family: Not on file  . Frequency of Social Gatherings with Friends and Family: Not on file  . Attends Religious Services: Not on file  . Active Member of Clubs or Organizations: Not on file  . Attends Club or  Organization Meetings: Not on file  . Marital Status: Not on file  Intimate Partner Violence:   . Fear of Current or Ex-Partner: Not on file  . Emotionally Abused: Not on file  . Physically Abused: Not on file  . Sexually Abused: Not on file   No current facility-administered medications on file prior to encounter.   Current Outpatient Medications on File Prior to Encounter  Medication Sig Dispense Refill  . pantoprazole (PROTONIX) 20 MG tablet Take 1 tablet (20 mg total) by mouth daily. 30 tablet 0  . Prenatal MV-Min-FA-Omega-3 (PRENATAL GUMMIES/DHA & FA) 0.4-32.5 MG CHEW Chew 1 tablet by mouth daily. 90 tablet 1  . promethazine (PHENERGAN) 25 MG tablet Take 1 tablet (25 mg total) by mouth every 6 (six) hours as needed for nausea or vomiting. 30 tablet 1  . scopolamine (TRANSDERM-SCOP) 1 MG/3DAYS Place 1 patch (1.5 mg  total) onto the skin every 3 (three) days. 10 patch 0  . sertraline (ZOLOFT) 25 MG tablet Take 25 mg by mouth daily.    . Blood Pressure Monitoring KIT 1 kit by Does not apply route once a week. 1 kit 0  . glycopyrrolate (ROBINUL) 1 MG tablet Take 1 tablet (1 mg total) by mouth 3 (three) times daily. For spitting 90 tablet 1  . polyethylene glycol powder (GLYCOLAX/MIRALAX) 17 GM/SCOOP powder Take 17 g by mouth in the morning and at bedtime. Twice a day until normal BM then once daily 765 g 0   No Known Allergies  I have reviewed patient's Past Medical Hx, Surgical Hx, Family Hx, Social Hx, medications and allergies.   ROS:  Review of Systems  Constitutional: Negative for chills and fever.  Gastrointestinal: Positive for vomiting. Negative for abdominal pain and diarrhea.  Musculoskeletal: Negative for myalgias.  Neurological: Negative for dizziness and headaches.   Review of Systems  Other systems negative   Physical Exam  Physical Exam Patient Vitals for the past 24 hrs:  BP Temp Temp src Pulse Resp SpO2 Weight  04/11/20 0057 108/70 99.2 F (37.3 C) Oral 91 15 100 % 90.8 kg   Constitutional: Well-developed, well-nourished female in no acute distress.  Cardiovascular: normal rate Respiratory: normal effort GI: Abd soft, non-tender. Pos BS x 4 MS: Extremities nontender, no edema, normal ROM Neurologic: Alert and oriented x 4.  GU: Neg CVAT.  LAB RESULTS Results for orders placed or performed during the hospital encounter of 04/11/20 (from the past 24 hour(s))  Comprehensive metabolic panel     Status: Abnormal   Collection Time: 04/11/20  1:50 AM  Result Value Ref Range   Sodium 134 (L) 135 - 145 mmol/L   Potassium 3.7 3.5 - 5.1 mmol/L   Chloride 103 98 - 111 mmol/L   CO2 21 (L) 22 - 32 mmol/L   Glucose, Bld 92 70 - 99 mg/dL   BUN 7 6 - 20 mg/dL   Creatinine, Ser 0.81 0.44 - 1.00 mg/dL   Calcium 9.4 8.9 - 10.3 mg/dL   Total Protein 7.6 6.5 - 8.1 g/dL   Albumin 3.5 3.5  - 5.0 g/dL   AST 12 (L) 15 - 41 U/L   ALT 13 0 - 44 U/L   Alkaline Phosphatase 42 38 - 126 U/L   Total Bilirubin 0.1 (L) 0.3 - 1.2 mg/dL   GFR calc non Af Amer >60 >60 mL/min   Anion gap 10 5 - 15      IMAGING   MAU Management/MDM: Ordered chemistry to evaluate for  hypokalemia.  Potassium is normal  We started IV fluids, giving LR and a bag with multivitamins We also gave Zofran, Decadron and vitamin B6.  She achieved good relief from these treatments.   ASSESSMENT Single IUP at 53w4dNausea and vomiting Dehydration  PLAN Discharge home Continue home meds Call [facility] to notify them she was here   Recommend she discuss her plan of care for nausea medications with them.  She agreed Continue Miralax to alleviate constipation Pt stable at time of discharge. Encouraged to return here or to other Urgent Care/ED if she develops worsening of symptoms, increase in pain, fever, or other concerning symptoms.    MHansel FeinsteinCNM, MSN Certified Nurse-Midwife 04/11/2020  1:48 AM

## 2020-04-11 NOTE — Telephone Encounter (Signed)
Patient called MAU Provider office. Was evaluated in MAU last night for nausea and vomiting. States she was under the impression that new medications would be prescribed but she is at the pharmacy and there are no new meds waiting for her. Encounter reviewed including discharge plan in MAU Provider note. New regimen does not appear to have been prescribed. Will send secure message to CNM to confirm. Patient knows to expect MyChart message or phone call.   Clayton Bibles, MSN, CNM Certified Nurse Midwife, [facility] for [employer], [facility] 04/11/20 4:33 PM

## 2020-04-11 NOTE — MAU Note (Signed)
. °  Brenda West is a 34 y.o. at [redacted]w[redacted]d here in MAU reporting: nausea and vomiting for the last week. Has not been able to keep anything down for a week. She states that shes thrown up x5 in the past 24 hours. No Vb, does endorse abdominal pain and constant spitting.  Pain score: 8 Vitals:   04/11/20 0057  BP: 108/70  Pulse: 91  Resp: 15  Temp: 99.2 F (37.3 C)  SpO2: 100%      Lab orders placed from triage: UA

## 2020-04-11 NOTE — Discharge Instructions (Signed)

## 2020-04-15 ENCOUNTER — Inpatient Hospital Stay (HOSPITAL_COMMUNITY)
Admission: AD | Admit: 2020-04-15 | Discharge: 2020-04-16 | Disposition: A | Discharge status: Home / Self Care | Payer: Medicaid Other | Attending: Obstetrics & Gynecology | Admitting: Obstetrics & Gynecology

## 2020-04-15 ENCOUNTER — Other Ambulatory Visit: Payer: Self-pay

## 2020-04-15 ENCOUNTER — Encounter (HOSPITAL_COMMUNITY): Payer: Self-pay | Admitting: Obstetrics & Gynecology

## 2020-04-15 DIAGNOSIS — K5900 Constipation, unspecified: Secondary | ICD-10-CM

## 2020-04-15 DIAGNOSIS — O99341 Other mental disorders complicating pregnancy, first trimester: Secondary | ICD-10-CM | POA: Insufficient documentation

## 2020-04-15 DIAGNOSIS — Z3A09 9 weeks gestation of pregnancy: Secondary | ICD-10-CM | POA: Insufficient documentation

## 2020-04-15 DIAGNOSIS — F329 Major depressive disorder, single episode, unspecified: Secondary | ICD-10-CM | POA: Insufficient documentation

## 2020-04-15 DIAGNOSIS — Z79899 Other long term (current) drug therapy: Secondary | ICD-10-CM | POA: Insufficient documentation

## 2020-04-15 DIAGNOSIS — O219 Vomiting of pregnancy, unspecified: Secondary | ICD-10-CM

## 2020-04-15 DIAGNOSIS — O26891 Other specified pregnancy related conditions, first trimester: Secondary | ICD-10-CM | POA: Insufficient documentation

## 2020-04-15 DIAGNOSIS — O99611 Diseases of the digestive system complicating pregnancy, first trimester: Secondary | ICD-10-CM | POA: Insufficient documentation

## 2020-04-15 DIAGNOSIS — R102 Pelvic and perineal pain: Secondary | ICD-10-CM | POA: Insufficient documentation

## 2020-04-15 DIAGNOSIS — F419 Anxiety disorder, unspecified: Secondary | ICD-10-CM | POA: Insufficient documentation

## 2020-04-15 DIAGNOSIS — O210 Mild hyperemesis gravidarum: Secondary | ICD-10-CM | POA: Insufficient documentation

## 2020-04-15 NOTE — MAU Note (Signed)
.   Brenda West is a 34 y.o. at [redacted]w[redacted]d here in MAU reporting: nausea and vomiting that worsened throughout the day. She states that she took a phenergan suppository this morning at 11 and it did not help. She also reports constipation x4 days. No Vb. Reports vaginal pain like she "may have a boil" also states it is causing a foul odor. Has thrown up x6 in the past 24 hours.   Pain score: 10 Vitals:   04/15/20 2348  BP: 128/79  Pulse: (!) 111  Resp: 16  Temp: 98.4 F (36.9 C)  SpO2: 100%      Lab orders placed from triage: UA

## 2020-04-16 ENCOUNTER — Other Ambulatory Visit: Payer: Self-pay | Admitting: Women's Health

## 2020-04-16 DIAGNOSIS — O99611 Diseases of the digestive system complicating pregnancy, first trimester: Secondary | ICD-10-CM | POA: Diagnosis not present

## 2020-04-16 DIAGNOSIS — Z79899 Other long term (current) drug therapy: Secondary | ICD-10-CM | POA: Diagnosis not present

## 2020-04-16 DIAGNOSIS — Z3A09 9 weeks gestation of pregnancy: Secondary | ICD-10-CM

## 2020-04-16 DIAGNOSIS — F329 Major depressive disorder, single episode, unspecified: Secondary | ICD-10-CM | POA: Diagnosis not present

## 2020-04-16 DIAGNOSIS — K5900 Constipation, unspecified: Secondary | ICD-10-CM

## 2020-04-16 DIAGNOSIS — O99341 Other mental disorders complicating pregnancy, first trimester: Secondary | ICD-10-CM | POA: Diagnosis not present

## 2020-04-16 DIAGNOSIS — O219 Vomiting of pregnancy, unspecified: Secondary | ICD-10-CM

## 2020-04-16 DIAGNOSIS — O26891 Other specified pregnancy related conditions, first trimester: Secondary | ICD-10-CM | POA: Diagnosis not present

## 2020-04-16 DIAGNOSIS — F419 Anxiety disorder, unspecified: Secondary | ICD-10-CM | POA: Diagnosis not present

## 2020-04-16 DIAGNOSIS — O210 Mild hyperemesis gravidarum: Secondary | ICD-10-CM | POA: Diagnosis not present

## 2020-04-16 DIAGNOSIS — R102 Pelvic and perineal pain: Secondary | ICD-10-CM | POA: Diagnosis not present

## 2020-04-16 LAB — COMPREHENSIVE METABOLIC PANEL
ALT: 18 U/L (ref 0–44)
AST: 23 U/L (ref 15–41)
Albumin: 3.4 g/dL — ABNORMAL LOW (ref 3.5–5.0)
Alkaline Phosphatase: 39 U/L (ref 38–126)
Anion gap: 10 (ref 5–15)
BUN: 9 mg/dL (ref 6–20)
CO2: 22 mmol/L (ref 22–32)
Calcium: 9 mg/dL (ref 8.9–10.3)
Chloride: 102 mmol/L (ref 98–111)
Creatinine, Ser: 0.66 mg/dL (ref 0.44–1.00)
GFR, Estimated: >60 mL/min (ref >60)
Glucose, Bld: 88 mg/dL (ref 70–99)
Potassium: 4.5 mmol/L (ref 3.5–5.1)
Sodium: 134 mmol/L — ABNORMAL LOW (ref 135–145)
Total Bilirubin: 0.7 mg/dL (ref 0.3–1.2)
Total Protein: 6.9 g/dL (ref 6.5–8.1)

## 2020-04-16 LAB — GC/CHLAMYDIA PROBE AMP (~~LOC~~) NOT AT ARMC
Chlamydia: NEGATIVE
Comment: NEGATIVE
Comment: NORMAL
Neisseria Gonorrhea: NEGATIVE

## 2020-04-16 LAB — WET PREP, GENITAL
Clue Cells Wet Prep HPF POC: NONE SEEN
Sperm: NONE SEEN
Trich, Wet Prep: NONE SEEN
Yeast Wet Prep HPF POC: NONE SEEN

## 2020-04-16 MED ORDER — ONDANSETRON HCL 4 MG/2ML IJ SOLN
4.0000 mg | Freq: Once | INTRAMUSCULAR | Status: AC
  Administered 2020-04-16: 4 mg via INTRAVENOUS
  Filled 2020-04-16: qty 2

## 2020-04-16 MED ORDER — FAMOTIDINE IN NACL 20-0.9 MG/50ML-% IV SOLN
20.0000 mg | Freq: Once | INTRAVENOUS | Status: AC
  Administered 2020-04-16: 20 mg via INTRAVENOUS
  Filled 2020-04-16: qty 50

## 2020-04-16 MED ORDER — LACTATED RINGERS IV BOLUS
1000.0000 mL | Freq: Once | INTRAVENOUS | Status: AC
  Administered 2020-04-16: 1000 mL via INTRAVENOUS

## 2020-04-16 MED ORDER — ONDANSETRON HCL 4 MG PO TABS: 4.0000 mg | ORAL_TABLET | Freq: Three times a day (TID) | ORAL | 1 refills | Status: DC | PRN

## 2020-04-16 MED ORDER — GLYCOPYRROLATE 0.2 MG/ML IJ SOLN
0.2000 mg | Freq: Once | INTRAMUSCULAR | Status: AC
  Administered 2020-04-16: 0.2 mg via INTRAVENOUS
  Filled 2020-04-16: qty 1

## 2020-04-16 MED ORDER — ONDANSETRON 4 MG PO TBDP: 4.0000 mg | ORAL_TABLET | Freq: Three times a day (TID) | ORAL | 1 refills | Status: AC | PRN

## 2020-04-16 NOTE — MAU Provider Note (Signed)
History     CSN: [id number]  Arrival date and time: 04/15/20 2211   First Provider Initiated Contact with Patient 04/16/20 0021      Chief Complaint  Patient presents with  . Vaginal Pain  . Morning Sickness   Brenda West is a 34 y.o. G2P0 at 65w2dwho presents to MAU with complaints of nausea/vomiting, constipation and vaginal pain. Patient reports that nausea and vomiting has been occurring for weeks, is currently taking phenergan suppositories for symptoms. Reports emesis 6x in the past 24 hours. Patient reports that phenergan was working until today. Reports that nausea and vomiting continued to worsen throughout the day. She also reports constipation that has been occurring for 4 days - last BM 4 days ago, patient reports taking an enema 3 days ago without relief.  Reports vaginal pain like she "may have a boil" also states it is causing a foul odor. She denies vaginal discharge or bleeding.    OB History    Gravida  2   Para  0   Term  0   Preterm  0   AB  1   Living  0     SAB  1   TAB  0   Ectopic  0   Multiple  0   Live Births              Past Medical History:  Diagnosis Date  . Allergy   . Anxiety   . Breast mass   . Bronchitis   . BV (bacterial vaginosis)   . Depression    doing ok  . Eye irritation   . Headache   . Infection    UTI  . Seasonal allergies   . Tooth abscess   . Vaginal Pap smear, abnormal   . Vaginal yeast infection   . Yeast infection     Past Surgical History:  Procedure Laterality Date  . HIP SURGERY    . WISDOM TOOTH EXTRACTION      Family History  Problem Relation Age of Onset  . Diabetes Father   . Cancer Father   . Hypertension Sister   . Diabetes Sister   . Glaucoma Brother   . Diabetes Brother   . Hypertension Brother     Social History   Tobacco Use  . Smoking status: Never Smoker  . Smokeless tobacco: Never Used  Vaping Use  . Vaping Use: Never used  Substance Use Topics  . Alcohol  use: Not Currently    Comment: Occasional  . Drug use: Not Currently    Types: Marijuana    Allergies: No Known Allergies  Medications Prior to Admission  Medication Sig Dispense Refill Last Dose  . pantoprazole (PROTONIX) 20 MG tablet Take 1 tablet (20 mg total) by mouth daily. 30 tablet 0 Past Week at Unknown time  . promethazine (PHENERGAN) 25 MG tablet Take 1 tablet (25 mg total) by mouth every 6 (six) hours as needed for nausea or vomiting. 30 tablet 1 04/14/2020 at 1100  . Blood Pressure Monitoring KIT 1 kit by Does not apply route once a week. 1 kit 0   . glycopyrrolate (ROBINUL) 1 MG tablet Take 1 tablet (1 mg total) by mouth 3 (three) times daily. For spitting 90 tablet 1   . polyethylene glycol powder (GLYCOLAX/MIRALAX) 17 GM/SCOOP powder Take 17 g by mouth in the morning and at bedtime. Twice a day until normal BM then once daily 765 g 0   . Prenatal  MV-Min-FA-Omega-3 (PRENATAL GUMMIES/DHA & FA) 0.4-32.5 MG CHEW Chew 1 tablet by mouth daily. 90 tablet 1   . scopolamine (TRANSDERM-SCOP) 1 MG/3DAYS Place 1 patch (1.5 mg total) onto the skin every 3 (three) days. 10 patch 0   . sertraline (ZOLOFT) 25 MG tablet Take 25 mg by mouth daily.       Review of Systems  Constitutional: Negative.   Respiratory: Negative.   Cardiovascular: Negative.   Gastrointestinal: Positive for constipation, nausea and vomiting. Negative for abdominal pain and diarrhea.  Genitourinary: Positive for vaginal pain.  Musculoskeletal: Negative.   Neurological: Negative.   Psychiatric/Behavioral: Negative.    Physical Exam   Blood pressure 128/79, pulse (!) 111, temperature 98.4 F (36.9 C), resp. rate 16, last menstrual period 02/08/2020, SpO2 100 %.  Physical Exam Vitals and nursing note reviewed.  HENT:     Head: Normocephalic.  Cardiovascular:     Rate and Rhythm: Normal rate and regular rhythm.  Pulmonary:     Effort: Pulmonary effort is normal. No respiratory distress.     Breath sounds:  Normal breath sounds. No wheezing.  Abdominal:     General: There is no distension.     Palpations: Abdomen is soft. There is no mass.     Tenderness: There is no abdominal tenderness. There is no guarding.  Genitourinary:   Skin:    General: Skin is warm and dry.  Neurological:     Mental Status: She is alert and oriented to person, place, and time.  Psychiatric:        Mood and Affect: Mood normal.        Behavior: Behavior normal.        Thought Content: Thought content normal.     MAU Course  Procedures  MDM Orders Placed This Encounter  Procedures  . Wet prep, genital  . Comprehensive metabolic panel   Meds ordered this encounter  Medications  . lactated ringers bolus 1,000 mL  . glycopyrrolate (ROBINUL) injection 0.2 mg  . ondansetron (ZOFRAN) injection 4 mg  . famotidine (PEPCID) IVPB 20 mg premix   Treatments in MAU include IV LR bolus, pepcid IV, robinul and zofran  Patient also given an enema in MAU prior to DC home d/t no BM for 4 days   Reassessment after treatments. Patient reports that she has been able to have BMx 2, PO challenge passed and patient able to rest.   Discussed with patient ingrown hair, encouraged warm compress and exfoliation. For constipation encouraged patient to increase water consumption and fiber in diet.   Discussed reasons to return to MAU. Follow up as scheduled in the office. Return to MAU as needed. Pt stable at time of discharge. Rx for zofran sent to pharmacy of choice.   Assessment and Plan   1. Nausea and vomiting during pregnancy   2. Constipation during pregnancy in first trimester    Discharge home Follow up as scheduled in the office for prenatal care Return to MAU as needed for reasons discussed and/or emergencies  Rx for zofran    Follow-up [facility] Follow up.   Specialty: Obstetrics and Gynecology Why: Make appointment to be seen for prenatal care Contact  information: [address], [address] [place] 04/16/2020, 5:27 AM

## 2020-04-16 NOTE — Progress Notes (Signed)
Patient called in and identified by two identifiers. Patient called to ask where her RX for Zofran is. RX for Zofran ODT sent to patient preferred pharmacy (was originally sent to incorrect pharmacy). Patient called in to state she cannot take pills and needs the ODT. Confirmed NKDA and confirmed list of current medications.  Marylen Ponto, NP  11:49 AM 04/16/2020

## 2020-04-18 ENCOUNTER — Other Ambulatory Visit: Payer: Self-pay | Admitting: Women's Health

## 2020-04-18 ENCOUNTER — Other Ambulatory Visit: Payer: Self-pay

## 2020-04-18 ENCOUNTER — Encounter (HOSPITAL_COMMUNITY): Payer: Self-pay | Admitting: Obstetrics and Gynecology

## 2020-04-18 ENCOUNTER — Inpatient Hospital Stay (HOSPITAL_COMMUNITY)
Admission: AD | Admit: 2020-04-18 | Discharge: 2020-04-18 | Disposition: A | Discharge status: Home / Self Care | Payer: Medicaid Other | Attending: Obstetrics and Gynecology | Admitting: Obstetrics and Gynecology

## 2020-04-18 DIAGNOSIS — Z79899 Other long term (current) drug therapy: Secondary | ICD-10-CM | POA: Diagnosis not present

## 2020-04-18 DIAGNOSIS — R112 Nausea with vomiting, unspecified: Secondary | ICD-10-CM | POA: Diagnosis not present

## 2020-04-18 DIAGNOSIS — O219 Vomiting of pregnancy, unspecified: Secondary | ICD-10-CM

## 2020-04-18 DIAGNOSIS — O26891 Other specified pregnancy related conditions, first trimester: Secondary | ICD-10-CM | POA: Diagnosis not present

## 2020-04-18 DIAGNOSIS — O99891 Other specified diseases and conditions complicating pregnancy: Secondary | ICD-10-CM | POA: Diagnosis not present

## 2020-04-18 DIAGNOSIS — Z3A09 9 weeks gestation of pregnancy: Secondary | ICD-10-CM | POA: Diagnosis not present

## 2020-04-18 DIAGNOSIS — F419 Anxiety disorder, unspecified: Secondary | ICD-10-CM | POA: Diagnosis not present

## 2020-04-18 DIAGNOSIS — O99611 Diseases of the digestive system complicating pregnancy, first trimester: Secondary | ICD-10-CM

## 2020-04-18 DIAGNOSIS — K5900 Constipation, unspecified: Secondary | ICD-10-CM | POA: Diagnosis not present

## 2020-04-18 DIAGNOSIS — O99341 Other mental disorders complicating pregnancy, first trimester: Secondary | ICD-10-CM | POA: Insufficient documentation

## 2020-04-18 DIAGNOSIS — R12 Heartburn: Secondary | ICD-10-CM | POA: Diagnosis not present

## 2020-04-18 LAB — URINALYSIS, ROUTINE W REFLEX MICROSCOPIC
Bilirubin Urine: NEGATIVE
Glucose, UA: >=500 mg/dL — AB
Ketones, ur: NEGATIVE mg/dL
Leukocytes,Ua: NEGATIVE
Nitrite: NEGATIVE
Protein, ur: NEGATIVE mg/dL
Specific Gravity, Urine: 1.024 (ref 1.005–1.030)
pH: 6 (ref 5.0–8.0)

## 2020-04-18 MED ORDER — DOCUSATE SODIUM 100 MG PO CAPS: 100.0000 mg | ORAL_CAPSULE | Freq: Every day | ORAL | 2 refills | Status: DC | PRN

## 2020-04-18 MED ORDER — PROMETHAZINE HCL 25 MG/ML IJ SOLN
12.5000 mg | Freq: Once | INTRAMUSCULAR | Status: AC
  Administered 2020-04-18: 12.5 mg via INTRAMUSCULAR
  Filled 2020-04-18: qty 1

## 2020-04-18 MED ORDER — TRANSDERM-SCOP (1.5 MG) 1 MG/3DAYS TD PT72: 1.0000 | MEDICATED_PATCH | TRANSDERMAL | 1 refills | Status: DC

## 2020-04-18 NOTE — Discharge Instructions (Signed)
You have constipation which is hard stools that are difficult to pass. It is important to have regular bowel movements every 1-3 days that are soft and easy to pass. Hard stools increase your risk of hemorrhoids and are very uncomfortable.   To prevent constipation you can increase the amount of fiber in your diet. Examples of foods with fiber are leafy greens, whole grain breads, oatmeal and other grains.  It is also important to drink at least eight 8oz glass of water everyday.   If you have not has a bowel movement in 4-5 days you made need to clean out your bowel.  This will have establish normal movement through your bowel.    Miralax Clean out  Take 8 capfuls of miralax in 64 oz of gatorade. You can use any fluid that appeals to you (gatorade, water, juice)  Continue to drink at least eight 8 oz glasses of water throughout the day  You can repeat with another 8 capfuls of miralax in 64 oz of gatorade if you are not having a large amount of stools  You will need to be at home and close to a bathroom for about 8 hours when you do the above as you may need to go to the bathroom frequently.   After you are cleaned out: - Start Colace100mg  twice daily - Start Miralax once daily - Start a daily fiber supplement like metamucil or citrucel - You can safely use enemas in pregnancy  - if you are having diarrhea you can reduce to Colace once a day or miralax every other day or a 1/2 capful daily.         Constipation, Adult Constipation is when a person has fewer bowel movements in a week than normal, has difficulty having a bowel movement, or has stools that are dry, hard, or larger than normal. Constipation may be caused by an underlying condition. It may become worse with age if a person takes certain medicines and does not take in enough fluids. Follow these instructions at home: Eating and drinking   Eat foods that have a lot of fiber, such as fresh fruits and vegetables, whole  grains, and beans.  Limit foods that are high in fat, low in fiber, or overly processed, such as french fries, hamburgers, cookies, candies, and soda.  Drink enough fluid to keep your urine clear or pale yellow. General instructions  Exercise regularly or as told by your health care provider.  Go to the restroom when you have the urge to go. Do not hold it in.  Take over-the-counter and prescription medicines only as told by your health care provider. These include any fiber supplements.  Practice pelvic floor retraining exercises, such as deep breathing while relaxing the lower abdomen and pelvic floor relaxation during bowel movements.  Watch your condition for any changes.  Keep all follow-up visits as told by your health care provider. This is important. Contact a health care provider if:  You have pain that gets worse.  You have a fever.  You do not have a bowel movement after 4 days.  You vomit.  You are not hungry.  You lose weight.  You are bleeding from the anus.  You have thin, pencil-like stools. Get help right away if:  You have a fever and your symptoms suddenly get worse.  You leak stool or have blood in your stool.  Your abdomen is bloated.  You have severe pain in your abdomen.  You feel dizzy  or you faint. This information is not intended to replace advice given to you by your health care provider. Make sure you discuss any questions you have with your health care provider. Document Revised: 06/05/2017 Document Reviewed: 12/12/2015 Elsevier Patient Education  2020 [employer]         Heartburn During Pregnancy  Heartburn is pain or discomfort in the throat or chest. It may cause a burning feeling. It happens when stomach acid moves up into the tube that carries food from your mouth to your stomach (esophagus). Heartburn is common during pregnancy. It usually goes away or gets better after giving birth. Follow these instructions at  home: Eating and drinking  Do not drink alcohol while you are pregnant.  Figure out which foods and beverages make you feel worse, and avoid them.  Beverages that you may want to avoid include: ? Coffee and tea (with or without caffeine). ? Energy drinks and sports drinks. ? Bubbly (carbonated) drinks or sodas. ? Citrus fruit juices.  Foods that you may want to avoid include: ? Chocolate and cocoa. ? Peppermint and mint flavorings. ? Garlic, onions, and horseradish. ? Spicy and acidic foods. These include peppers, chili powder, curry powder, vinegar, hot sauces, and barbecue sauce. ? Citrus fruits, such as oranges, lemons, and limes. ? Tomato-based foods, such as red sauce, chili, and salsa. ? Fried and fatty foods, such as donuts, french fries, potato chips, and high-fat dressings. ? High-fat meats, such as hot dogs, cold cuts, sausage, ham, and bacon. ? High-fat dairy items, such as whole milk, butter, and cheese.  Eat small meals often, instead of large meals.  Avoid drinking a lot of liquid with your meals.  Avoid eating meals during the 2-3 hours before you go to bed.  Avoid lying down right after you eat.  Do not exercise right after you eat. Medicines  Take over-the-counter and prescription medicines only as told by your doctor.  Do not take aspirin, ibuprofen, or other NSAIDs unless your doctor tells you to do that.  Your doctor may tell you to avoid medicines that have sodium bicarbonate in them. General instructions   If told, raise the head of your bed about 6 inches (15 cm). You can do this by putting blocks under the legs. Sleeping with more pillows does not help with heartburn.  Do not use any products that contain nicotine or tobacco, such as cigarettes and e-cigarettes. If you need help quitting, ask your doctor.  Wear loose-fitting clothing.  Try to lower your stress, such as with yoga or meditation. If you need help, ask your doctor.  Stay at a  healthy weight. If you are overweight, work with your doctor to safely lose weight.  Keep all follow-up visits as told by your doctor. This is important. Contact a doctor if:  You get new symptoms.  Your symptoms do not get better with treatment.  You have weight loss and you do not know why.  You have trouble swallowing.  You make loud sounds when you breathe (wheeze).  You have a cough that does not go away.  You have heartburn often for more than 2 weeks.  You feel sick to your stomach (nauseous), and this does not get better with treatment.  You are throwing up (vomiting), and this does not get better with treatment.  You have pain in your belly (abdomen). Get help right away if:  You have very bad chest pain that spreads to your arm, neck, or  jaw.  You feel sweaty, dizzy, or light-headed.  You have trouble breathing.  You have pain when swallowing.  You throw up and your throw-up looks like blood or coffee grounds.  Your poop (stool) is bloody or black. This information is not intended to replace advice given to you by your health care provider. Make sure you discuss any questions you have with your health care provider. Document Revised: 10/14/2018 Document Reviewed: 03/10/2016 Elsevier Patient Education  2020 [employer].

## 2020-04-18 NOTE — MAU Note (Signed)
.   Brenda West is a 34 y.o. at [redacted]w[redacted]d here in MAU reporting: she has N/V, lower abdominal cramping and constipation. States she gave herself an enema last night with no results LMP: 02/08/20 Onset of complaint: ongoing Pain score: 8 Vitals:   04/18/20 1531 04/18/20 1532  BP:  132/75  Pulse:  89  Resp:  16  Temp:  99.2 F (37.3 C)  SpO2: 100%      FHT: Lab orders placed from triage: [facility]

## 2020-04-18 NOTE — MAU Provider Note (Signed)
History     CSN: [id number]  Arrival date and time: 04/18/20 1447   First Provider Initiated Contact with Patient 04/18/20 1710      Chief Complaint  Patient presents with  . Constipation  . Emesis   [name] is a 34 y.o. G2P0010 at 52w4dwho presents to MAU for nausea and vomiting and constipation. Patient reports she has told her OB about the issues that she was having, but has not found successful treatment with them.  Patient reports N/V started this morning. Patient reports vomiting x2 since this morning. Patient reports she is also spitting and is carrying around a spit bottle with her. Patient reports she was given Robinul in the past, but it dried out her mouth and she declines additional use of Robinul at this time. Patient reports she has been able to eat and drink normally up until this morning. Patient reports she has not taken any nausea or vomiting medications since 7AM this morning.  Patient reports constipation that started when she found out she was pregnant, which she does not remember how long ago that was. Before pregnancy, patient reports she would have a bowel movement every day or every other day. Patient reports she was in MAU 3-4 days ago and received an enema that made her go to the bathroom 4 times. Patient reports she has tried Colace and took it only for 2 days and was taking 1-2 pills per day. Patient also reports taking Mirlax for "whatever the prescription said." Patient reports the last time she took any oral medication for constipation was last night, but she threw it back up and reports she was taking it daily until that time.  Pt denies VB, LOF, vaginal discharge/odor/itching. Pt denies abdominal pain, diarrhea, or urinary problems. Pt denies fever, chills, fatigue, sweating or changes in appetite. Pt denies SOB or chest pain. Pt denies dizziness, HA, light-headedness, weakness.  Problems this pregnancy include: pt reports she is considered  high risk because she lost her last baby at 16 weeks and reports she is getting a cerclage at 16 weeks. Allergies? NKDA Current medications/supplements? See list in computer, patient reports she has not taken any medications since 7AM this morning Prenatal care provider? [facility], next appt in one week   OB History    Gravida  2   Para  0   Term  0   Preterm  0   AB  1   Living  0     SAB  1   TAB  0   Ectopic  0   Multiple  0   Live Births              Past Medical History:  Diagnosis Date  . Allergy   . Anxiety   . Breast mass   . Bronchitis   . BV (bacterial vaginosis)   . Depression    doing ok  . Eye irritation   . Headache   . Infection    UTI  . Seasonal allergies   . Tooth abscess   . Vaginal Pap smear, abnormal   . Vaginal yeast infection   . Yeast infection     Past Surgical History:  Procedure Laterality Date  . HIP SURGERY    . WISDOM TOOTH EXTRACTION      Family History  Problem Relation Age of Onset  . Diabetes Father   . Cancer Father   . Hypertension Sister   . Diabetes Sister   .  Glaucoma Brother   . Diabetes Brother   . Hypertension Brother     Social History   Tobacco Use  . Smoking status: Never Smoker  . Smokeless tobacco: Never Used  Vaping Use  . Vaping Use: Never used  Substance Use Topics  . Alcohol use: Not Currently    Comment: Occasional  . Drug use: Not Currently    Types: Marijuana    Allergies: No Known Allergies  Medications Prior to Admission  Medication Sig Dispense Refill Last Dose  . ondansetron (ZOFRAN ODT) 4 MG disintegrating tablet Take 1 tablet (4 mg total) by mouth every 8 (eight) hours as needed for nausea or vomiting. 20 tablet 1 04/18/2020 at Unknown time  . pantoprazole (PROTONIX) 20 MG tablet Take 1 tablet (20 mg total) by mouth daily. 30 tablet 0 04/17/2020 at Unknown time  . polyethylene glycol powder (GLYCOLAX/MIRALAX) 17 GM/SCOOP powder Take 17 g by mouth in the morning  and at bedtime. Twice a day until normal BM then once daily 765 g 0 Past Week at Unknown time  . promethazine (PHENERGAN) 25 MG tablet Take 1 tablet (25 mg total) by mouth every 6 (six) hours as needed for nausea or vomiting. 30 tablet 1 04/17/2020 at Unknown time  . Blood Pressure Monitoring KIT 1 kit by Does not apply route once a week. 1 kit 0   . Prenatal MV-Min-FA-Omega-3 (PRENATAL GUMMIES/DHA & FA) 0.4-32.5 MG CHEW Chew 1 tablet by mouth daily. 90 tablet 1   . scopolamine (TRANSDERM-SCOP) 1 MG/3DAYS Place 1 patch (1.5 mg total) onto the skin every 3 (three) days. 10 patch 0  at not taking    Review of Systems  Constitutional: Negative for chills, diaphoresis, fatigue and fever.  Eyes: Negative for visual disturbance.  Respiratory: Negative for shortness of breath.   Cardiovascular: Negative for chest pain.  Gastrointestinal: Positive for constipation, nausea and vomiting. Negative for abdominal pain and diarrhea.  Genitourinary: Negative for dysuria, flank pain, frequency, pelvic pain, urgency, vaginal bleeding and vaginal discharge.  Neurological: Negative for dizziness, weakness, light-headedness and headaches.   Physical Exam   Blood pressure 131/73, pulse (!) 111, temperature 98.8 F (37.1 C), temperature source Oral, resp. rate 17, last menstrual period 02/08/2020, SpO2 100 %.  Patient Vitals for the past 24 hrs:  BP Temp Temp src Pulse Resp SpO2  04/18/20 2004 131/73 98.8 F (37.1 C) Oral (!) 111 17 100 %  04/18/20 1532 132/75 99.2 F (37.3 C) -- 89 16 --  04/18/20 1531 -- -- -- -- -- 100 %   Physical Exam Vitals and nursing note reviewed.     MAU Course  Procedures  MDM  NAUSEA/VOMITING -pt states she was able to eat and drink normally up until last night/this morning -pt reports eating [place] food and [place] -pt endorses vomiting x2 since this morning and no episodes of vomiting since arriving to MAU -patient has scopolamine, phenergan, zofran and  protonix prescribed for home use, but is not actively using them -patient is actively spitting, but declines Robinul -pt is requesting vitamin bag, but shows no evidence of dehydration or ketones on her UA and received a vitamin bag on 04/11/2020 -UA: hazy/>/=500GLU/sm hgb -12.75m IM Phenergan given in MAU -pt able to drink Diet Dr. PMalachi Bonds eat mashed potatoes in MAU  CONSTIPATION -pt stated that she had an enema 4 days ago and had 4 bowel movements after the enema -upon chart review, enema was given about 48 hours ago on 04/16/2020 -discussed with  patient enema is not warranted in this short period -discussed Miralax protocol and instructions given on constipation regimen to use prior to attempting Miralax protocol with instructions that it may take a few days before patient notices results -referral made to GI for further constipation -also discussed s/e of constipation with Zofran, and advised patient to take other medications for n/v and use Zofran as a last resort  OTHER CONCERNS -patient did not report to provider, but did report to RN that she is requesting medications for anxiety and heartburn -patient was prescribed Protonix on 03/30/2020 for home use and states took it for the first time last night -will refer to mental health counseling for anxiety  -[facility] Lev[facility] Dy present for conversation regarding patient history and treatment plan -during conversation, patient unwilling to hear entire set of instructions from provider with constant interruptions and unwillingness to listen to full treatment plan -patient visitor present for almost entire visit  -pt asking to leave shortly after IM injection, unwilling to stay for [place] to check for FHR  -pt discharged to home in stable condition  Orders Placed This Encounter  Procedures  . Urinalysis, Routine w reflex microscopic Urine, Clean Catch    Standing Status:   Standing    Number of Occurrences:   1  . Ambulatory referral to Behavioral  Health    Referral Priority:   Routine    Referral Type:   Psychiatric    Referral Reason:   Specialty Services Required    Requested Specialty:   Behavioral Health    Number of Visits Requested:   1  . Ambulatory referral to Gastroenterology    Referral Priority:   Routine    Referral Type:   Consultation    Referral Reason:   Specialty Services Required    Number of Visits Requested:   1  . Diet regular Room service appropriate? Yes; Fluid consistency: Thin    Standing Status:   Standing    Number of Occurrences:   1    Order Specific Question:   Room service appropriate?    Answer:   Yes    Order Specific Question:   Fluid consistency:    Answer:   Thin  . Discharge patient    Order Specific Question:   Discharge disposition    Answer:   01-Home or Self Care [1]    Order Specific Question:   Discharge patient date    Answer:   04/18/2020   Meds ordered this encounter  Medications  . promethazine (PHENERGAN) injection 12.5 mg  . docusate sodium (COLACE) 100 MG capsule    Sig: Take 1 capsule (100 mg total) by mouth daily as needed.    Dispense:  30 capsule    Refill:  2    Order Specific Question:   Supervising Provider    Answer:   Rip Harbour, MICHAEL L [[id number]]    Assessment and Plan   1. Nausea and vomiting in pregnancy prior to [redacted] weeks gestation   2. Constipation during pregnancy in first trimester   3. Anxiety in pregnancy in first trimester, antepartum   4. Heartburn during pregnancy in first trimester     Allergies as of 04/18/2020   No Known Allergies     Medication List    TAKE these medications   Blood Pressure Monitoring Kit 1 kit by Does not apply route once a week.   docusate sodium 100 MG capsule Commonly known as: Colace Take 1 capsule (100 mg total) by mouth daily  as needed.   docusate sodium 100 MG capsule Commonly known as: Colace Take 1 capsule (100 mg total) by mouth daily as needed.   ondansetron 4 MG disintegrating tablet Commonly known  as: Zofran ODT Take 1 tablet (4 mg total) by mouth every 8 (eight) hours as needed for nausea or vomiting.   pantoprazole 20 MG tablet Commonly known as: PROTONIX Take 1 tablet (20 mg total) by mouth daily.   polyethylene glycol powder 17 GM/SCOOP powder Commonly known as: GLYCOLAX/MIRALAX Take 17 g by mouth in the morning and at bedtime. Twice a day until normal BM then once daily   Prenatal Gummies/DHA & FA 0.4-32.5 MG Chew Chew 1 tablet by mouth daily.   promethazine 25 MG tablet Commonly known as: PHENERGAN Take 1 tablet (25 mg total) by mouth every 6 (six) hours as needed for nausea or vomiting.   scopolamine 1 MG/3DAYS Commonly known as: TRANSDERM-SCOP Place 1 patch (1.5 mg total) onto the skin every 3 (three) days. What changed: Another medication with the same name was added. Make sure you understand how and when to take each.   Transderm-Scop (1.5 MG) 1 MG/3DAYS Generic drug: scopolamine Place 1 patch (1.5 mg total) onto the skin every 3 (three) days for 8 doses. What changed: You were already taking a medication with the same name, and this prescription was added. Make sure you understand how and when to take each.       -pt advised to take medications around the clock and not to stop taking if feeling better -discussed nonpharmacologic and pharmacologic treatments of N/V -discussed normal expectations for N/V in pregnancy -pt discharged to home in stable condition -Miralax and Gatorade protocol given -discussed home constipation protocol with Colace, Miralax and fiber supplement -referral to GI For constipation -referral made to mental health counseling as patient is reporting anxiety in this pregnancy over the loss of her previous pregnancy -return MAU precautions given -pt discharged to home in stable condition  Elmyra Ricks E Ferris Tally 04/18/2020, 8:17 PM

## 2020-04-18 NOTE — MAU Note (Addendum)
Responded to second call from patient with complaints of gastric reflux, nausea and anxiety. Enquired about pts feeling of anxiety and she stated "I cant stop thinking about my baby." ( Referring to previous pregnancy loss)Support given. Concerns relayed to provider. NAD.

## 2020-04-19 NOTE — MAU Note (Signed)
Pt was given extensive education about appropriate diet for nausea/vomiting and constipation. Patient stated that she could not get "healthy food" because she is too sick to cook or go to the store. States she can often only order takeout and is unaware what food is healthy.  She had insisted that we provide her with "healthy" food to take home with her. She stated that her support person had gone to the cafeteria to get her some food, but could not find anything.  She wanted [place] to order room service for her. The provider was notified about patient's request.  [facility]rich Pates' [facility] came to room and ordered the patient some mashed potatoes to go.  Patient indicated satisfaction with the plan discussed with the nurse to eat bland, healthy food until her nausea and vomiting/constipation resolve.

## 2020-04-20 NOTE — Telephone Encounter (Signed)
Patient called the MAU provider line. She reports that she is still having constipation. On review of the chart this has been an ongoing issue for her for many months including prior to pregnancy. Usual constipation comfort measures reviewed. Patient also reports that she has called for a GI appt, but has not been scheduled for this yet.   Thressa Sheller DNP, CNM  04/20/20  7:20 PM

## 2020-04-26 ENCOUNTER — Encounter (HOSPITAL_COMMUNITY): Payer: Self-pay | Admitting: Obstetrics and Gynecology

## 2020-04-26 ENCOUNTER — Other Ambulatory Visit: Payer: Self-pay

## 2020-04-26 ENCOUNTER — Inpatient Hospital Stay (HOSPITAL_COMMUNITY)
Admission: AD | Admit: 2020-04-26 | Discharge: 2020-04-27 | Disposition: A | Discharge status: Home / Self Care | Payer: Medicaid Other | Attending: Obstetrics and Gynecology | Admitting: Obstetrics and Gynecology

## 2020-04-26 DIAGNOSIS — R110 Nausea: Secondary | ICD-10-CM

## 2020-04-26 DIAGNOSIS — O26891 Other specified pregnancy related conditions, first trimester: Secondary | ICD-10-CM | POA: Diagnosis not present

## 2020-04-26 DIAGNOSIS — K117 Disturbances of salivary secretion: Secondary | ICD-10-CM | POA: Insufficient documentation

## 2020-04-26 DIAGNOSIS — O219 Vomiting of pregnancy, unspecified: Secondary | ICD-10-CM | POA: Diagnosis not present

## 2020-04-26 DIAGNOSIS — K5900 Constipation, unspecified: Secondary | ICD-10-CM | POA: Diagnosis not present

## 2020-04-26 DIAGNOSIS — Z3A10 10 weeks gestation of pregnancy: Secondary | ICD-10-CM | POA: Insufficient documentation

## 2020-04-26 DIAGNOSIS — K5909 Other constipation: Secondary | ICD-10-CM | POA: Diagnosis not present

## 2020-04-26 DIAGNOSIS — O99611 Diseases of the digestive system complicating pregnancy, first trimester: Secondary | ICD-10-CM | POA: Insufficient documentation

## 2020-04-26 MED ORDER — ALUM & MAG HYDROXIDE-SIMETH 200-200-20 MG/5ML PO SUSP
30.0000 mL | Freq: Once | ORAL | Status: AC
  Administered 2020-04-26: 30 mL via ORAL
  Filled 2020-04-26: qty 30

## 2020-04-26 MED ORDER — M.V.I. ADULT IV INJ
Freq: Once | INTRAVENOUS | Status: AC
  Filled 2020-04-26 (×2): qty 10

## 2020-04-26 MED ORDER — LIDOCAINE VISCOUS HCL 2 % MT SOLN
15.0000 mL | Freq: Once | OROMUCOSAL | Status: AC
  Administered 2020-04-26: 15 mL via ORAL
  Filled 2020-04-26: qty 15

## 2020-04-26 MED ORDER — LACTATED RINGERS IV SOLN: Freq: Once | INTRAVENOUS | Status: AC

## 2020-04-26 NOTE — MAU Provider Note (Signed)
Chief Complaint: Chest Pain and Dehydration   First Provider Initiated Contact with Patient 04/26/20 2229        SUBJECTIVE HPI: Brenda West is a 34 y.o. G2P0010 at 55w5dby LMP who presents to maternity admissions reporting constipation, chest pain/epigastric pain, and "dehydration".  Has had NO vomiting in 24 hours.  Has lots of spitting, but states does not mind it and does not take med because it makes her mouth dry. States she eats frequently with no vomiting but does not drink because "I don't want it".  . She denies vaginal bleeding, vaginal itching/burning, urinary symptoms, h/a, dizziness, or fever/chills.    Has had multiple MAU visits for the same thing.   Requesting IV fluids, multivitamin IV, soap suds enema, and nausea meds.  States needs more scop patches because there are no refills according to pharmacy (records reveal she does)  States takes prescribed meds with no relief.  [facility]   HPI RN Note: Brenda TOLLEYis a 34y.o. at 13w5dere in MAU reporting: [facility] She states that it is intermittent and in the middle of her chest. She also reports constipation and dehydration. Has not thrown up in the past 24 hours. Has not had a bowel movement since she received an enema in MAU on 04/16/20  Past Medical History:  Diagnosis Date  . Allergy   . Anxiety   . Breast mass   . Bronchitis   . BV (bacterial vaginosis)   . Depression    doing ok  . Eye irritation   . Headache   . Infection    UTI  . Seasonal allergies   . Tooth abscess   . Vaginal Pap smear, abnormal   . Vaginal yeast infection   . Yeast infection    Past Surgical History:  Procedure Laterality Date  . HIP SURGERY    . WISDOM TOOTH EXTRACTION     Social History   Socioeconomic History  . Marital status: Single    Spouse name: Not on file  . Number of children: Not  on file  . Years of education: Not on file  . Highest education level: Not on file  Occupational History  . Not on file  Tobacco Use  . Smoking status: Never Smoker  . Smokeless tobacco: Never Used  Vaping Use  . Vaping Use: Never used  Substance and Sexual Activity  . Alcohol use: Not Currently    Comment: Occasional  . Drug use: Not Currently    Types: Marijuana  . Sexual activity: Yes  Other Topics Concern  . Not on file  Social History Narrative   ** Merged History Encounter **       Social Determinants of Health   Financial Resource Strain:   . Difficulty of Paying Living Expenses: Not on file  Food Insecurity:   . Worried About RuCharity fundraisern the Last Year: Not on file  . Ran Out of Food in the Last Year: Not on file  Transportation Needs:   . Lack of Transportation (Medical): Not on file  . Lack of Transportation (Non-Medical): Not on file  Physical Activity:   . Days of Exercise per Week: Not on file  . Minutes of Exercise per Session: Not on file  Stress:   . Feeling of Stress : Not on file  Social Connections:   .  Frequency of Communication with Friends and Family: Not on file  . Frequency of Social Gatherings with Friends and Family: Not on file  . Attends Religious Services: Not on file  . Active Member of Clubs or Organizations: Not on file  . Attends Archivist Meetings: Not on file  . Marital Status: Not on file  Intimate Partner Violence:   . Fear of Current or Ex-Partner: Not on file  . Emotionally Abused: Not on file  . Physically Abused: Not on file  . Sexually Abused: Not on file   No current facility-administered medications on file prior to encounter.   Current Outpatient Medications on File Prior to Encounter  Medication Sig Dispense Refill  . Blood Pressure Monitoring KIT 1 kit by Does not apply route once a week. 1 kit 0  . docusate sodium (COLACE) 100 MG capsule Take 1 capsule (100 mg total) by mouth daily as needed.  30 capsule 2  . docusate sodium (COLACE) 100 MG capsule Take 1 capsule (100 mg total) by mouth daily as needed. 30 capsule 2  . ondansetron (ZOFRAN ODT) 4 MG disintegrating tablet Take 1 tablet (4 mg total) by mouth every 8 (eight) hours as needed for nausea or vomiting. 20 tablet 1  . pantoprazole (PROTONIX) 20 MG tablet Take 1 tablet (20 mg total) by mouth daily. 30 tablet 0  . polyethylene glycol powder (GLYCOLAX/MIRALAX) 17 GM/SCOOP powder Take 17 g by mouth in the morning and at bedtime. Twice a day until normal BM then once daily 765 g 0  . Prenatal MV-Min-FA-Omega-3 (PRENATAL GUMMIES/DHA & FA) 0.4-32.5 MG CHEW Chew 1 tablet by mouth daily. 90 tablet 1  . promethazine (PHENERGAN) 25 MG tablet Take 1 tablet (25 mg total) by mouth every 6 (six) hours as needed for nausea or vomiting. 30 tablet 1  . scopolamine (TRANSDERM-SCOP) 1 MG/3DAYS Place 1 patch (1.5 mg total) onto the skin every 3 (three) days. 10 patch 0  . TRANSDERM-SCOP, 1.5 MG, 1 MG/3DAYS Place 1 patch (1.5 mg total) onto the skin every 3 (three) days for 8 doses. 4 patch 1   No Known Allergies  I have reviewed patient's Past Medical Hx, Surgical Hx, Family Hx, Social Hx, medications and allergies.   ROS:  Review of Systems  Constitutional: Negative for chills and fever.  Respiratory: Negative for shortness of breath.   Cardiovascular: Positive for chest pain.  Gastrointestinal: Positive for constipation and nausea. Negative for abdominal pain, diarrhea and vomiting.  Genitourinary: Negative for dysuria, pelvic pain and vaginal bleeding.   Review of Systems  Other systems negative   Physical Exam  Physical Exam Patient Vitals for the past 24 hrs:  BP Pulse Resp SpO2  04/26/20 2200 131/81 96 15 100 %   Constitutional: Well-developed, well-nourished female in no acute distress.  Cardiovascular: normal rate Respiratory: normal effort GI: Abd soft, non-tender. Pos BS x 4 MS: Extremities nontender, no edema, normal  ROM Neurologic: Alert and oriented x 4.  GU: Neg CVAT.  PELVIC EXAM: deferred.   LAB RESULTS Results for orders placed or performed during the hospital encounter of 04/26/20 (from the past 24 hour(s))  CBC     Status: Abnormal   Collection Time: 04/26/20 11:31 PM  Result Value Ref Range   WBC 11.2 (H) 4.0 - 10.5 K/uL   RBC 4.17 3.87 - 5.11 MIL/uL   Hemoglobin 11.1 (L) 12.0 - 15.0 g/dL   HCT 33.9 (L) 36 - 46 %   MCV 81.3 80.0 -  100.0 fL   MCH 26.6 26.0 - 34.0 pg   MCHC 32.7 30.0 - 36.0 g/dL   RDW 13.9 11.5 - 15.5 %   Platelets 344 150 - 400 K/uL   nRBC 0.0 0.0 - 0.2 %  Comprehensive metabolic panel     Status: Abnormal   Collection Time: 04/26/20 11:31 PM  Result Value Ref Range   Sodium 133 (L) 135 - 145 mmol/L   Potassium 3.5 3.5 - 5.1 mmol/L   Chloride 106 98 - 111 mmol/L   CO2 20 (L) 22 - 32 mmol/L   Glucose, Bld 94 70 - 99 mg/dL   BUN <5 (L) 6 - 20 mg/dL   Creatinine, Ser 0.74 0.44 - 1.00 mg/dL   Calcium 8.6 (L) 8.9 - 10.3 mg/dL   Total Protein 6.6 6.5 - 8.1 g/dL   Albumin 3.1 (L) 3.5 - 5.0 g/dL   AST 11 (L) 15 - 41 U/L   ALT 13 0 - 44 U/L   Alkaline Phosphatase 39 38 - 126 U/L   Total Bilirubin 0.2 (L) 0.3 - 1.2 mg/dL   GFR, Estimated >60 >60 mL/min   Anion gap 7 5 - 15        IMAGING 12 lead EKD done Normal sinus rhythim Rate 86  MAU Management/MDM: Ordered 12 lead EKG due to c/o chest pain.  This showed NSR. GI cocktail given with excellent relief of chest pain LR infusion given.  Patient then requests MVI infusion since she could not take PNV Phenergan 12.31m given for nausea. I told her I would not give her any zofran because of her chronic constipation.  She has had this for quite a long time and chronically uses enemas at home. States Fleet enema does not help but the "big one here does help" SSE given here with minimal results.  I told her it was likely that the food is not in her large intestine yet.  Also that her decision not to drink much  contributes to this as does her use of zofran. Has had no vomiting. Since pain is resolved, will discharge home [facility].  ASSESSMENT Single IUP at [id number] constipation Nausea/vomiting Ptyalism  PLAN Discharge home refuses to use Robinul but wants more Scope patches, though they fall off frequently Has meds at home for nausea Discouraged use of zofran given her chronic constipation Rx Fiber Con capsules Refilled scopolamine patches  Pt stable at time of discharge. Encouraged to return here or to other Urgent Care/ED if she develops worsening of symptoms, increase in pain, fever, or other concerning symptoms.    MaHansel FeinsteinNM, MSN Certified Nurse-Midwife 04/26/2020  10:30 PM

## 2020-04-26 NOTE — MAU Note (Signed)
.   Brenda West is a 34 y.o. at 107w5d here in MAU reporting: chest pain that started after her appointment at [facility] today. She states that it is intermittent and in the middle of her chest. She also reports constipation and dehydration. Has not thrown up in the past 24 hours. Has not had a bowel movement since she received an enema in MAU on 04/16/20.   Pain score: 9 Vitals:   04/26/20 2200  BP: 131/81  Pulse: 96  Resp: 15  SpO2: 100%     FHT:167

## 2020-04-27 DIAGNOSIS — O219 Vomiting of pregnancy, unspecified: Secondary | ICD-10-CM

## 2020-04-27 DIAGNOSIS — K5900 Constipation, unspecified: Secondary | ICD-10-CM

## 2020-04-27 DIAGNOSIS — O26891 Other specified pregnancy related conditions, first trimester: Secondary | ICD-10-CM | POA: Diagnosis not present

## 2020-04-27 DIAGNOSIS — Z3A10 10 weeks gestation of pregnancy: Secondary | ICD-10-CM

## 2020-04-27 DIAGNOSIS — R110 Nausea: Secondary | ICD-10-CM

## 2020-04-27 DIAGNOSIS — K117 Disturbances of salivary secretion: Secondary | ICD-10-CM

## 2020-04-27 LAB — COMPREHENSIVE METABOLIC PANEL
ALT: 13 U/L (ref 0–44)
AST: 11 U/L — ABNORMAL LOW (ref 15–41)
Albumin: 3.1 g/dL — ABNORMAL LOW (ref 3.5–5.0)
Alkaline Phosphatase: 39 U/L (ref 38–126)
Anion gap: 7 (ref 5–15)
BUN: <5 mg/dL — ABNORMAL LOW (ref 6–20)
CO2: 20 mmol/L — ABNORMAL LOW (ref 22–32)
Calcium: 8.6 mg/dL — ABNORMAL LOW (ref 8.9–10.3)
Chloride: 106 mmol/L (ref 98–111)
Creatinine, Ser: 0.74 mg/dL (ref 0.44–1.00)
GFR, Estimated: >60 mL/min (ref >60)
Glucose, Bld: 94 mg/dL (ref 70–99)
Potassium: 3.5 mmol/L (ref 3.5–5.1)
Sodium: 133 mmol/L — ABNORMAL LOW (ref 135–145)
Total Bilirubin: 0.2 mg/dL — ABNORMAL LOW (ref 0.3–1.2)
Total Protein: 6.6 g/dL (ref 6.5–8.1)

## 2020-04-27 LAB — CBC
HCT: 33.9 % — ABNORMAL LOW (ref 36.0–46.0)
Hemoglobin: 11.1 g/dL — ABNORMAL LOW (ref 12.0–15.0)
MCH: 26.6 pg (ref 26.0–34.0)
MCHC: 32.7 g/dL (ref 30.0–36.0)
MCV: 81.3 fL (ref 80.0–100.0)
Platelets: 344 10*3/uL (ref 150–400)
RBC: 4.17 MIL/uL (ref 3.87–5.11)
RDW: 13.9 % (ref 11.5–15.5)
WBC: 11.2 10*3/uL — ABNORMAL HIGH (ref 4.0–10.5)
nRBC: 0 % (ref 0.0–0.2)

## 2020-04-27 MED ORDER — PROMETHAZINE HCL 25 MG/ML IJ SOLN
12.5000 mg | Freq: Once | INTRAMUSCULAR | Status: AC
  Administered 2020-04-27: 12.5 mg via INTRAVENOUS
  Filled 2020-04-27: qty 1

## 2020-04-27 MED ORDER — FIBERCON 625 MG PO TABS: 625.0000 mg | ORAL_TABLET | Freq: Every day | ORAL | 3 refills | Status: DC

## 2020-04-27 MED ORDER — SCOPOLAMINE 1 MG/3DAYS TD PT72: 1.0000 | MEDICATED_PATCH | TRANSDERMAL | 3 refills | Status: DC

## 2020-04-27 NOTE — Discharge Instructions (Signed)
Constipation, Adult Constipation is when a person has fewer bowel movements in a week than normal, has difficulty having a bowel movement, or has stools that are dry, hard, or larger than normal. Constipation may be caused by an underlying condition. It may become worse with age if a person takes certain medicines and does not take in enough fluids. Follow these instructions at home: Eating and drinking   Eat foods that have a lot of fiber, such as fresh fruits and vegetables, whole grains, and beans.  Limit foods that are high in fat, low in fiber, or overly processed, such as french fries, hamburgers, cookies, candies, and soda.  Drink enough fluid to keep your urine clear or pale yellow. General instructions  Exercise regularly or as told by your health care provider.  Go to the restroom when you have the urge to go. Do not hold it in.  Take over-the-counter and prescription medicines only as told by your health care provider. These include any fiber supplements.  Practice pelvic floor retraining exercises, such as deep breathing while relaxing the lower abdomen and pelvic floor relaxation during bowel movements.  Watch your condition for any changes.  Keep all follow-up visits as told by your health care provider. This is important. Contact a health care provider if:  You have pain that gets worse.  You have a fever.  You do not have a bowel movement after 4 days.  You vomit.  You are not hungry.  You lose weight.  You are bleeding from the anus.  You have thin, pencil-like stools. Get help right away if:  You have a fever and your symptoms suddenly get worse.  You leak stool or have blood in your stool.  Your abdomen is bloated.  You have severe pain in your abdomen.  You feel dizzy or you faint. This information is not intended to replace advice given to you by your health care provider. Make sure you discuss any questions you have with your health care  provider. Document Revised: 06/05/2017 Document Reviewed: 12/12/2015 Elsevier Patient Education  2020 Elsevier Inc.  Morning Sickness  Morning sickness is when a woman feels nauseous during pregnancy. This nauseous feeling may or may not come with vomiting. It often occurs in the morning, but it can be a problem at any time of day. Morning sickness is most common during the first trimester. In some cases, it may continue throughout pregnancy. Although morning sickness is unpleasant, it is usually harmless unless the woman develops severe and continual vomiting (hyperemesis gravidarum), a condition that requires more intense treatment. What are the causes? The exact cause of this condition is not known, but it seems to be related to normal hormonal changes that occur in pregnancy. What increases the risk? You are more likely to develop this condition if:  You experienced nausea or vomiting before your pregnancy.  You had morning sickness during a previous pregnancy.  You are pregnant with more than one baby, such as twins. What are the signs or symptoms? Symptoms of this condition include:  Nausea.  Vomiting. How is this diagnosed? This condition is usually diagnosed based on your signs and symptoms. How is this treated? In many cases, treatment is not needed for this condition. Making some changes to what you eat may help to control symptoms. Your health care provider may also prescribe or recommend:  Vitamin B6 supplements.  Anti-nausea medicines.  Ginger. Follow these instructions at home: Medicines  Take over-the-counter and prescription medicines only  only as told by your health care provider. Do not use any prescription, over-the-counter, or herbal medicines for morning sickness without first talking with your health care provider.  Taking multivitamins before getting pregnant can prevent or decrease the severity of morning sickness in most women. Eating and drinking  Eat  a piece of dry toast or crackers before getting out of bed in the morning.  Eat 5 or 6 small meals a day.  Eat dry and bland foods, such as rice or a baked potato. Foods that are high in carbohydrates are often helpful.  Avoid greasy, fatty, and spicy foods.  Have someone cook for you if the smell of any food causes nausea and vomiting.  If you feel nauseous after taking prenatal vitamins, take the vitamins at night or with a snack.  Snack on protein foods between meals if you are hungry. Nuts, yogurt, and cheese are good options.  Drink fluids throughout the day.  Try ginger ale made with real ginger, ginger tea made from fresh grated ginger, or ginger candies. General instructions  Do not use any products that contain nicotine or tobacco, such as cigarettes and e-cigarettes. If you need help quitting, ask your health care provider.  Get an air purifier to keep the air in your house free of odors.  Get plenty of fresh air.  Try to avoid odors that trigger your nausea.  Consider trying these methods to help relieve symptoms: ? Wearing an acupressure wristband. These wristbands are often worn for seasickness. ? Acupuncture. Contact a health care provider if:  Your home remedies are not working and you need medicine.  You feel dizzy or light-headed.  You are losing weight. Get help right away if:  You have persistent and uncontrolled nausea and vomiting.  You faint.  You have severe pain in your abdomen. Summary  Morning sickness is when a woman feels nauseous during pregnancy. This nauseous feeling may or may not come with vomiting.  Morning sickness is most common during the first trimester.  It often occurs in the morning, but it can be a problem at any time of day.  In many cases, treatment is not needed for this condition. Making some changes to what you eat may help to control symptoms. This information is not intended to replace advice given to you by your  health care provider. Make sure you discuss any questions you have with your health care provider. Document Revised: 06/05/2017 Document Reviewed: 07/26/2016 Elsevier Patient Education  2020 Elsevier Inc.  

## 2020-05-08 ENCOUNTER — Encounter (HOSPITAL_COMMUNITY): Payer: Self-pay | Admitting: Obstetrics & Gynecology

## 2020-05-08 ENCOUNTER — Inpatient Hospital Stay (HOSPITAL_COMMUNITY)
Admission: AD | Admit: 2020-05-08 | Discharge: 2020-05-09 | Disposition: A | Discharge status: Home / Self Care | Payer: Medicaid Other | Attending: Obstetrics & Gynecology | Admitting: Obstetrics & Gynecology

## 2020-05-08 ENCOUNTER — Other Ambulatory Visit: Payer: Self-pay

## 2020-05-08 DIAGNOSIS — Z3A12 12 weeks gestation of pregnancy: Secondary | ICD-10-CM | POA: Insufficient documentation

## 2020-05-08 DIAGNOSIS — O219 Vomiting of pregnancy, unspecified: Secondary | ICD-10-CM

## 2020-05-08 DIAGNOSIS — R102 Pelvic and perineal pain: Secondary | ICD-10-CM | POA: Insufficient documentation

## 2020-05-08 DIAGNOSIS — O3431 Maternal care for cervical incompetence, first trimester: Secondary | ICD-10-CM

## 2020-05-08 DIAGNOSIS — O210 Mild hyperemesis gravidarum: Secondary | ICD-10-CM | POA: Diagnosis not present

## 2020-05-08 DIAGNOSIS — O26891 Other specified pregnancy related conditions, first trimester: Secondary | ICD-10-CM | POA: Diagnosis not present

## 2020-05-08 LAB — COMPREHENSIVE METABOLIC PANEL
ALT: 12 U/L (ref 0–44)
AST: 18 U/L (ref 15–41)
Albumin: 3.3 g/dL — ABNORMAL LOW (ref 3.5–5.0)
Alkaline Phosphatase: 40 U/L (ref 38–126)
Anion gap: 10 (ref 5–15)
BUN: 6 mg/dL (ref 6–20)
CO2: 21 mmol/L — ABNORMAL LOW (ref 22–32)
Calcium: 9.1 mg/dL (ref 8.9–10.3)
Chloride: 101 mmol/L (ref 98–111)
Creatinine, Ser: 0.68 mg/dL (ref 0.44–1.00)
GFR, Estimated: >60 mL/min (ref >60)
Glucose, Bld: 82 mg/dL (ref 70–99)
Potassium: 3.6 mmol/L (ref 3.5–5.1)
Sodium: 132 mmol/L — ABNORMAL LOW (ref 135–145)
Total Bilirubin: <0.1 mg/dL — ABNORMAL LOW (ref 0.3–1.2)
Total Protein: 6.9 g/dL (ref 6.5–8.1)

## 2020-05-08 LAB — URINALYSIS, ROUTINE W REFLEX MICROSCOPIC
Bilirubin Urine: NEGATIVE
Glucose, UA: 150 mg/dL — AB
Ketones, ur: 5 mg/dL — AB
Leukocytes,Ua: NEGATIVE
Nitrite: NEGATIVE
Protein, ur: 30 mg/dL — AB
Specific Gravity, Urine: 1.033 — ABNORMAL HIGH (ref 1.005–1.030)
pH: 5 (ref 5.0–8.0)

## 2020-05-08 LAB — CBC
HCT: 36.6 % (ref 36.0–46.0)
Hemoglobin: 11.6 g/dL — ABNORMAL LOW (ref 12.0–15.0)
MCH: 26.2 pg (ref 26.0–34.0)
MCHC: 31.7 g/dL (ref 30.0–36.0)
MCV: 82.8 fL (ref 80.0–100.0)
Platelets: 365 10*3/uL (ref 150–400)
RBC: 4.42 MIL/uL (ref 3.87–5.11)
RDW: 14.1 % (ref 11.5–15.5)
WBC: 12.7 10*3/uL — ABNORMAL HIGH (ref 4.0–10.5)
nRBC: 0 % (ref 0.0–0.2)

## 2020-05-08 LAB — RAPID URINE DRUG SCREEN, HOSP PERFORMED
Amphetamines: NOT DETECTED
Barbiturates: NOT DETECTED
Benzodiazepines: NOT DETECTED
Cocaine: NOT DETECTED
Opiates: NOT DETECTED
Tetrahydrocannabinol: NOT DETECTED

## 2020-05-08 MED ORDER — PROMETHAZINE HCL 25 MG/ML IJ SOLN
25.0000 mg | Freq: Once | INTRAMUSCULAR | Status: AC
  Administered 2020-05-08: 25 mg via INTRAVENOUS
  Filled 2020-05-08: qty 1

## 2020-05-08 MED ORDER — LACTATED RINGERS IV BOLUS
1000.0000 mL | Freq: Once | INTRAVENOUS | Status: AC
  Administered 2020-05-08: 1000 mL via INTRAVENOUS

## 2020-05-08 NOTE — MAU Provider Note (Signed)
History     CSN: [id number]  Arrival date and time: 05/08/20 1850   First Provider Initiated Contact with Patient 05/08/20 2215      Chief Complaint  Patient presents with  . Pelvic Pain  . Emesis   HPI Brenda West is a 34 y.o. G2P0010 at 50w3dwho presents to MAU with chief complaints of nausea, vomiting and concern for "severe dehydration".  These are recurrent concerns for which patient has previously been treated in MAU. She reports vomiting 4-5 times today 05/08/2020 and took Zofran SL around 0530. She endorses episodes of dizziness after vomiting.   Patient also c/o pelvic pain, new onset 05/08/2020. She endorses pain with walking and prolonged standing. No pain at rest. She has not taken medication or tried other treatments for this complaint.  [facility]. She endorses intermittent scant spotting which she discussed with them. She denies overt vaginal bleeding.  She also denies dysuria, fever, abdominal pain, or recent illness.  OB History    Gravida  2   Para  0   Term  0   Preterm  0   AB  1   Living  0     SAB  1   TAB  0   Ectopic  0   Multiple  0   Live Births              Past Medical History:  Diagnosis Date  . Allergy   . Anxiety   . Breast mass   . Bronchitis   . BV (bacterial vaginosis)   . Depression    doing ok  . Eye irritation   . Headache   . Infection    UTI  . Seasonal allergies   . Tooth abscess   . Vaginal Pap smear, abnormal   . Vaginal yeast infection   . Yeast infection     Past Surgical History:  Procedure Laterality Date  . HIP SURGERY    . WISDOM TOOTH EXTRACTION      Family History  Problem Relation Age of Onset  . Diabetes Father   . Cancer Father   . Hypertension Sister   . Diabetes Sister   . Glaucoma Brother   . Diabetes Brother   . Hypertension Brother     Social History   Tobacco Use  . Smoking status: Never  Smoker  . Smokeless tobacco: Never Used  Vaping Use  . Vaping Use: Never used  Substance Use Topics  . Alcohol use: Not Currently    Comment: Occasional  . Drug use: Not Currently    Types: Marijuana    Allergies: No Known Allergies  Medications Prior to Admission  Medication Sig Dispense Refill Last Dose  . famotidine (PEPCID) 10 MG tablet Take 5 mg by mouth 2 (two) times daily.     . ondansetron (ZOFRAN ODT) 4 MG disintegrating tablet Take 1 tablet (4 mg total) by mouth every 8 (eight) hours as needed for nausea or vomiting. 20 tablet 1 05/08/2020 at Unknown time  . Prenatal MV-Min-FA-Omega-3 (PRENATAL GUMMIES/DHA & FA) 0.4-32.5 MG CHEW Chew 1 tablet by mouth daily. 90 tablet 1 Past Week at Unknown time  . promethazine (PHENERGAN) 25 MG tablet Take 1 tablet (25 mg total) by mouth every 6 (six) hours as needed for nausea or vomiting. 30 tablet 1 Past Week at Unknown time  . Blood Pressure Monitoring KIT 1 kit by Does not apply route  once a week. 1 kit 0   . docusate sodium (COLACE) 100 MG capsule Take 1 capsule (100 mg total) by mouth daily as needed. 30 capsule 2   . docusate sodium (COLACE) 100 MG capsule Take 1 capsule (100 mg total) by mouth daily as needed. 30 capsule 2   . pantoprazole (PROTONIX) 20 MG tablet Take 1 tablet (20 mg total) by mouth daily. 30 tablet 0   . polycarbophil (FIBERCON) 625 MG tablet Take 1 tablet (625 mg total) by mouth daily. 30 tablet 3   . polyethylene glycol powder (GLYCOLAX/MIRALAX) 17 GM/SCOOP powder Take 17 g by mouth in the morning and at bedtime. Twice a day until normal BM then once daily 765 g 0   . scopolamine (TRANSDERM-SCOP, 1.5 MG,) 1 MG/3DAYS Place 1 patch (1.5 mg total) onto the skin every 3 (three) days. 10 patch 3     Review of Systems  Gastrointestinal: Positive for nausea and vomiting.  All other systems reviewed and are negative.  Physical Exam   Blood pressure 128/74, pulse 94, temperature 99.5 F (37.5 C), temperature source  Oral, resp. rate 20, height _0  (1.6 m), weight 93.8 kg, last menstrual period 02/08/2020.  Physical Exam Vitals and nursing note reviewed. Exam conducted with a chaperone present.  Constitutional:      General: She is not in acute distress.    Appearance: She is not ill-appearing or toxic-appearing.  Cardiovascular:     Rate and Rhythm: Normal rate.     Pulses: Normal pulses.     Heart sounds: Normal heart sounds.  Pulmonary:     Effort: Pulmonary effort is normal.     Breath sounds: Normal breath sounds.  Abdominal:     General: Abdomen is flat. Bowel sounds are normal.     Tenderness: There is no abdominal tenderness. There is no right CVA tenderness, left CVA tenderness or guarding.  Genitourinary:    General: Normal vulva.     Comments: Pelvic exam: External genitalia normal, vaginal walls pink and well rugated, cervix visually closed, no lesions noted. Cerclage suture visible. No bleeding noted  Skin:    General: Skin is warm and dry.     Capillary Refill: Capillary refill takes less than 2 seconds.  Neurological:     General: No focal deficit present.     Mental Status: She is alert.  Psychiatric:        Mood and Affect: Mood normal.        Behavior: Behavior normal.        Thought Content: Thought content normal.        Judgment: Judgment normal.     MAU Course  Procedures: speculum exam  --[facility] records visible in Care Everywhere. Reviewed by CNM. Hgb 12.2 on 10/28, now 11.6  --Abnormal UA, moderate hemoglobin without leuks or bacteria. Suggests contamination from spotting associated with relatively recent cerclage placement  --Patient sleeping after Phenergan. No episodes of vomiting during evaluation in MAU  Patient Vitals for the past 24 hrs:  BP Temp Temp src Pulse Resp SpO2 Height Weight  05/09/20 0133 98/61 -- -- -- -- -- -- --  05/09/20 0003 98/61 -- -- 84 14 100 % -- --  05/08/20 2134 (!) 112/57 -- -- 83 -- -- -- --  05/08/20 1920 128/74 99.5 F  (37.5 C) Oral 94 20 -- _1  (1.6 m) 93.8 kg   Results for orders placed or performed during the hospital encounter of 05/08/20 (from the past 24  hour(s))  Urinalysis, Routine w reflex microscopic Urine, Clean Catch     Status: Abnormal   Collection Time: 05/08/20  7:24 PM  Result Value Ref Range   Color, Urine YELLOW YELLOW   APPearance HAZY (A) CLEAR   Specific Gravity, Urine 1.033 (H) 1.005 - 1.030   pH 5.0 5.0 - 8.0   Glucose, UA 150 (A) NEGATIVE mg/dL   Hgb urine dipstick MODERATE (A) NEGATIVE   Bilirubin Urine NEGATIVE NEGATIVE   Ketones, ur 5 (A) NEGATIVE mg/dL   Protein, ur 30 (A) NEGATIVE mg/dL   Nitrite NEGATIVE NEGATIVE   Leukocytes,Ua NEGATIVE NEGATIVE   RBC / HPF 11-20 0 - 5 RBC/hpf   WBC, UA 0-5 0 - 5 WBC/hpf   Bacteria, UA RARE (A) NONE SEEN   Squamous Epithelial / LPF 0-5 0 - 5   Mucus PRESENT    Ca Oxalate Crys, UA PRESENT   Rapid urine drug screen (hospital performed)     Status: None   Collection Time: 05/08/20  7:35 PM  Result Value Ref Range   Opiates NONE DETECTED NONE DETECTED   Cocaine NONE DETECTED NONE DETECTED   Benzodiazepines NONE DETECTED NONE DETECTED   Amphetamines NONE DETECTED NONE DETECTED   Tetrahydrocannabinol NONE DETECTED NONE DETECTED   Barbiturates NONE DETECTED NONE DETECTED  CBC     Status: Abnormal   Collection Time: 05/08/20  8:41 PM  Result Value Ref Range   WBC 12.7 (H) 4.0 - 10.5 K/uL   RBC 4.42 3.87 - 5.11 MIL/uL   Hemoglobin 11.6 (L) 12.0 - 15.0 g/dL   HCT 36.6 36 - 46 %   MCV 82.8 80.0 - 100.0 fL   MCH 26.2 26.0 - 34.0 pg   MCHC 31.7 30.0 - 36.0 g/dL   RDW 14.1 11.5 - 15.5 %   Platelets 365 150 - 400 K/uL   nRBC 0.0 0.0 - 0.2 %  Comprehensive metabolic panel     Status: Abnormal   Collection Time: 05/08/20  8:41 PM  Result Value Ref Range   Sodium 132 (L) 135 - 145 mmol/L   Potassium 3.6 3.5 - 5.1 mmol/L   Chloride 101 98 - 111 mmol/L   CO2 21 (L) 22 - 32 mmol/L   Glucose, Bld 82 70 - 99 mg/dL   BUN 6 6 - 20  mg/dL   Creatinine, Ser 0.68 0.44 - 1.00 mg/dL   Calcium 9.1 8.9 - 10.3 mg/dL   Total Protein 6.9 6.5 - 8.1 g/dL   Albumin 3.3 (L) 3.5 - 5.0 g/dL   AST 18 15 - 41 U/L   ALT 12 0 - 44 U/L   Alkaline Phosphatase 40 38 - 126 U/L   Total Bilirubin <0.1 (L) 0.3 - 1.2 mg/dL   GFR, Estimated >60 >60 mL/min   Anion gap 10 5 - 15    Assessment and Plan  --34 y.o. G2P0010 at [redacted]w[redacted]d --FHT 160 by doppler --Cerclage in place --Blood type A POS --Continue outpatient antiemetics as previously prescribed --Discharge home in stable condition  F/U:  --Next appt with [facility] is 05/10/2020  SDarlina Rumpf CNM 05/09/2020, 1:44 AM

## 2020-05-08 NOTE — Discharge Instructions (Signed)

## 2020-05-08 NOTE — MAU Note (Signed)
PT SAYS SHE FEELS DEHYDRATED- VOMITED X4 TODAY . HAS PELVIC PAIN- STARTED YESTERDAY .  HAS CIRCLAGE IN AT [facility] - COMES HERE FOR AN EMERGENCY.

## 2020-05-09 DIAGNOSIS — R102 Pelvic and perineal pain: Secondary | ICD-10-CM

## 2020-05-09 DIAGNOSIS — O219 Vomiting of pregnancy, unspecified: Secondary | ICD-10-CM | POA: Diagnosis not present

## 2020-05-09 DIAGNOSIS — Z3A12 12 weeks gestation of pregnancy: Secondary | ICD-10-CM

## 2020-05-09 DIAGNOSIS — O26891 Other specified pregnancy related conditions, first trimester: Secondary | ICD-10-CM

## 2020-05-16 ENCOUNTER — Other Ambulatory Visit: Payer: Self-pay

## 2020-05-16 ENCOUNTER — Ambulatory Visit
Admission: EM | Admit: 2020-05-16 | Discharge: 2020-05-16 | Disposition: A | Discharge status: Home / Self Care | Payer: Medicaid Other | Attending: Physician Assistant | Admitting: Physician Assistant

## 2020-05-16 DIAGNOSIS — L309 Dermatitis, unspecified: Secondary | ICD-10-CM | POA: Diagnosis not present

## 2020-05-16 DIAGNOSIS — B373 Candidiasis of vulva and vagina: Secondary | ICD-10-CM

## 2020-05-16 DIAGNOSIS — L304 Erythema intertrigo: Secondary | ICD-10-CM

## 2020-05-16 DIAGNOSIS — B3731 Acute candidiasis of vulva and vagina: Secondary | ICD-10-CM

## 2020-05-16 MED ORDER — MICONAZOLE NITRATE 200 MG VA SUPP: 200.0000 mg | Freq: Every day | VAGINAL | 0 refills | Status: AC

## 2020-05-16 MED ORDER — NYSTATIN 100000 UNIT/GM EX CREA: TOPICAL_CREAM | CUTANEOUS | 0 refills | Status: DC

## 2020-05-16 MED ORDER — TRIAMCINOLONE ACETONIDE 0.1 % EX CREA: 1.0000 "application " | TOPICAL_CREAM | Freq: Two times a day (BID) | CUTANEOUS | 0 refills | Status: DC

## 2020-05-16 NOTE — ED Provider Notes (Signed)
[name]    CSN: [id number] Arrival date & time: 05/16/20  1912      History   Chief Complaint Chief Complaint  Patient presents with  . Rash    x 3 days    HPI Brenda West is a 34 y.o. female.   34 year old female who is [redacted] weeks pregnant comes in for multiple complaints.   1. Rash to the chest/neck x 3 days. Itching in sensation. Denies pain, spreading redness, warmth, pain. Denies drainage. Does have some hygiene product changes.  2. Vaginal itching x few days. Noticed clumpy discharge. Denies urinary changes, abdominal pain, vaginal bleeding.      Past Medical History:  Diagnosis Date  . Allergy   . Anxiety   . Breast mass   . Bronchitis   . BV (bacterial vaginosis)   . Depression    doing ok  . Eye irritation   . Headache   . Infection    UTI  . Seasonal allergies   . Tooth abscess   . Vaginal Pap smear, abnormal   . Vaginal yeast infection   . Yeast infection     Patient Active Problem List   Diagnosis Date Noted  . Cervical insufficiency during pregnancy, antepartum 02/14/2019  . Inevitable abortion 02/14/2019  . 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 Encounter for supervision of normal pregnancy, unspecified, unspecified trimester 01/04/2019    Past Surgical History:  Procedure Laterality Date  . HIP SURGERY    . WISDOM TOOTH EXTRACTION      OB History    Gravida  2   Para  0   Term  0   Preterm  0   AB  1   Living  0     SAB  1   TAB  0   Ectopic  0   Multiple  0   Live Births               Home Medications    Prior to Admission medications   Medication Sig Start Date End Date Taking? Authorizing Provider  Blood Pressure Monitoring KIT 1 kit by Does not apply route once a week. 01/13/19  Yes Gerri Lins S, DO  famotidine (PEPCID) 10 MG tablet Take 5 mg by mouth 2 (two) times daily.   Yes [provider]  ondansetron (ZOFRAN ODT) 4 MG disintegrating tablet Take 1 tablet (4 mg total) by  mouth every 8 (eight) hours as needed for nausea or vomiting. 04/16/20  Yes Nugent, Gerrie Nordmann, NP  polycarbophil (FIBERCON) 625 MG tablet Take 1 tablet (625 mg total) by mouth daily. 04/27/20  Yes Seabron Spates, CNM  Prenatal MV-Min-FA-Omega-3 (PRENATAL GUMMIES/DHA & FA) 0.4-32.5 MG CHEW Chew 1 tablet by mouth daily. 12/20/18  Yes Starr Lake, CNM  miconazole (MICOTIN) 200 MG vaginal suppository Place 1 suppository (200 mg total) vaginally at bedtime. 05/16/20   Tasia Catchings, Syrah Daughtrey V, PA-C  nystatin cream (MYCOSTATIN) Apply to affected area 2 times daily 05/16/20   Tasia Catchings, Rutha Melgoza V, PA-C  polyethylene glycol powder (GLYCOLAX/MIRALAX) 17 GM/SCOOP powder Take 17 g by mouth in the morning and at bedtime. Twice a day until normal BM then once daily 04/03/20   Julianne Handler, CNM  promethazine (PHENERGAN) 25 MG tablet Take 1 tablet (25 mg total) by mouth every 6 (six) hours as needed for nausea or vomiting. 03/30/20   Gaylan Gerold R, CNM  scopolamine (TRANSDERM-SCOP, 1.5 MG,) 1 MG/3DAYS Place 1 patch (1.5 mg  total) onto the skin every 3 (three) days. 04/27/20   Seabron Spates, CNM  triamcinolone cream (KENALOG) 0.1 % Apply 1 application topically 2 (two) times daily. 05/16/20   Tasia Catchings, Velma Agnes V, PA-C  pantoprazole (PROTONIX) 20 MG tablet Take 1 tablet (20 mg total) by mouth daily. 03/30/20 05/16/20  Seabron Spates, CNM    Family History Family History  Problem Relation Age of Onset  . Diabetes Father   . Cancer Father   . Hypertension Sister   . Diabetes Sister   . Glaucoma Brother   . Diabetes Brother   . Hypertension Brother     Social History Social History   Tobacco Use  . Smoking status: Never Smoker  . Smokeless tobacco: Never Used  Vaping Use  . Vaping Use: Never used  Substance Use Topics  . Alcohol use: Not Currently    Comment: Occasional  . Drug use: Not Currently    Types: Marijuana     Allergies   Patient has no known allergies.   Review of Systems Review of  Systems  Reason unable to perform ROS: See HPI as above.     Physical Exam Triage Vital Signs ED Triage Vitals  Enc Vitals Group     BP 05/16/20 1948 114/78     Pulse Rate 05/16/20 1948 100     Resp 05/16/20 1948 20     Temp 05/16/20 1948 99.2 F (37.3 C)     Temp Source 05/16/20 1948 Oral     SpO2 05/16/20 1948 98 %     Weight --      Height --      Head Circumference --      Peak Flow --      Pain Score 05/16/20 2004 0     Pain Loc --      Pain Edu? --      Excl. in [place]? --    No data found.  Updated Vital Signs BP 114/78 (BP Location: Left Arm)   Pulse 100   Temp 99.2 F (37.3 C) (Oral)   Resp 20   LMP 02/08/2020   SpO2 98%   Physical Exam Constitutional:      General: Brenda West is not in acute distress.    Appearance: Normal appearance. Brenda West is well-developed. Brenda West is not toxic-appearing or diaphoretic.  HENT:     Head: Normocephalic and atraumatic.  Eyes:     Conjunctiva/sclera: Conjunctivae normal.     Pupils: Pupils are equal, round, and reactive to light.  Pulmonary:     Effort: Pulmonary effort is normal. No respiratory distress.  Musculoskeletal:     Cervical back: Normal range of motion and neck supple.  Skin:    General: Skin is warm and dry.     Comments: Hypopigmentation with skin thickening around the next. No erythema, warmth. No drainage.  Hyperpigmentation with some erythema under bilateral breast.   Neurological:     Mental Status: Brenda West is alert and oriented to person, place, and time.      UC Treatments / Results  Labs (all labs ordered are listed, but only abnormal results are displayed) Labs Reviewed - No data to display  EKG   Radiology No results found.  Procedures Procedures (including critical care time)  Medications Ordered in UC Medications - No data to display  Initial Impression / Assessment and Plan / UC Course  I have reviewed the triage vital signs and the nursing notes.  Pertinent labs & imaging results that  were  available during my care of the patient were reviewed by me and considered in my medical decision making (see chart for details).    1. Dermatitis Triamcinolone around the neck. Avoid new hygiene product for now. Return precautions given.  2. Intertrigo Discussed to keep area clean and dry. Triamcinolone/nystatin as directed. Return precautions given.  3. Yeast vaginitis Miconazole as directed to cover for yeast. Return precautions given. Otherwise to follow up with OB as scheduled for reevaluation.  Patient expresses understanding and agrees to plan.  Final Clinical Impressions(s) / UC Diagnoses   Final diagnoses:  Dermatitis  Intertrigo  Yeast vaginitis   ED Prescriptions    Medication Sig Dispense Auth. Provider   miconazole (MICOTIN) 200 MG vaginal suppository Place 1 suppository (200 mg total) vaginally at bedtime. 3 suppository Daryana Whirley V, PA-C   triamcinolone cream (KENALOG) 0.1 % Apply 1 application topically 2 (two) times daily. 80 g Haunani Dickard V, PA-C   nystatin cream (MYCOSTATIN) Apply to affected area 2 times daily 30 g Ok Edwards, PA-C     PDMP not reviewed this encounter.   Ok Edwards, PA-C 05/21/20 [phone]

## 2020-05-16 NOTE — Discharge Instructions (Addendum)
Start triamcinolone cream to the neck and under the breast.  Nystatin cream under the breast.  Miconazole vaginal suppository as directed for yeast infection.  Follow-up with OB/GYN as scheduled next Tuesday for reevaluation.

## 2020-05-16 NOTE — ED Triage Notes (Signed)
Pt states she has had a rash in her groin area as well as a possible yeast infection. Pt is also 3 months pregnant. Pt is aox4 and ambulatory.

## 2020-06-11 IMAGING — US OBSTETRIC <14 WK ULTRASOUND
1 series · 15 of 28 positions shown · non-contrast
Comparison: None.

CLINICAL DATA: Abdominal pain in 1st trimester pregnancy.

EXAM:
OBSTETRIC <14 WK US AND TRANSVAGINAL OB US
TECHNIQUE: Both transabdominal and transvaginal ultrasound examinations were
performed for complete evaluation of the gestation as well as the
maternal uterus, adnexal regions, and pelvic cul-de-sac.
Transvaginal technique was performed to assess early pregnancy.

[Series 1: obstetric <14 wk ultrasound · 15 of 42 slices shown]
[im 1/42]
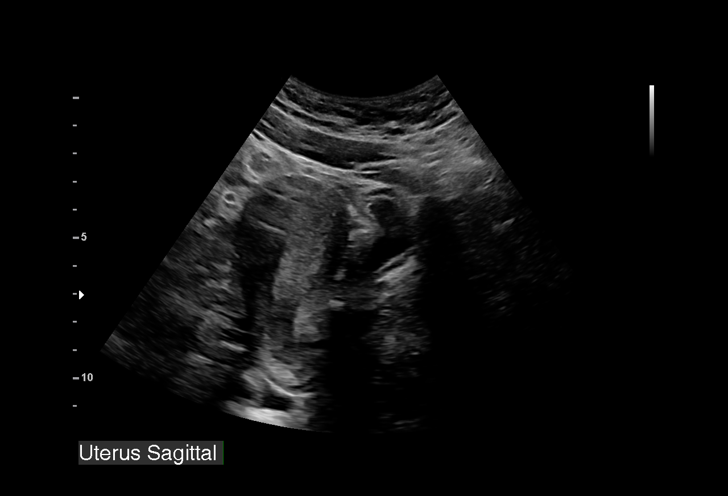
[im 4/42]
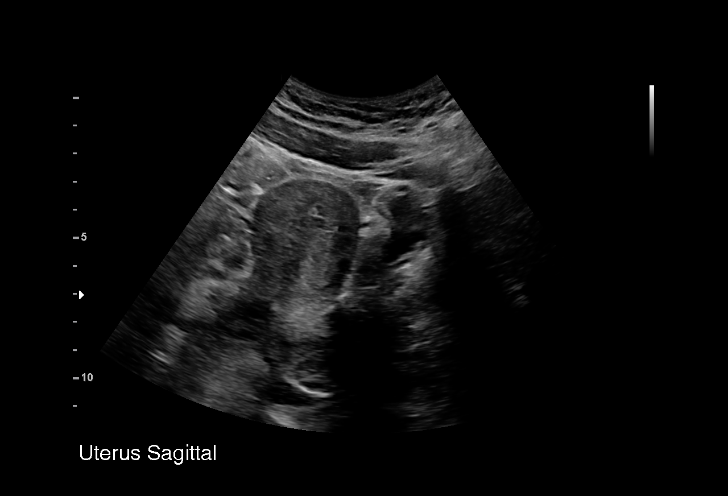
[im 7/42]
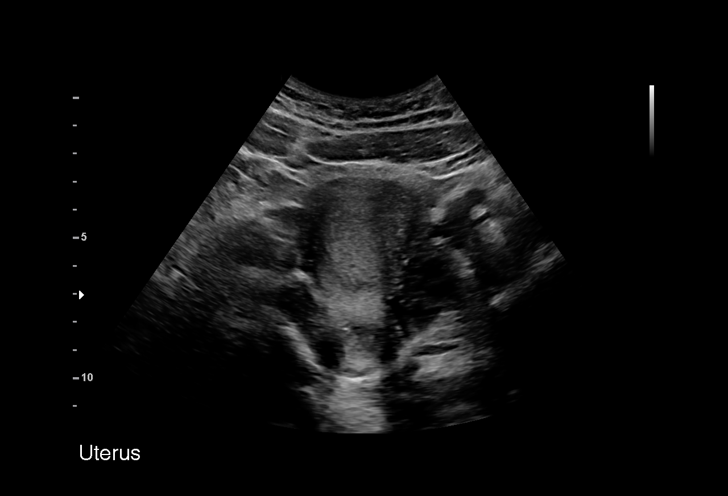
[im 10/42]
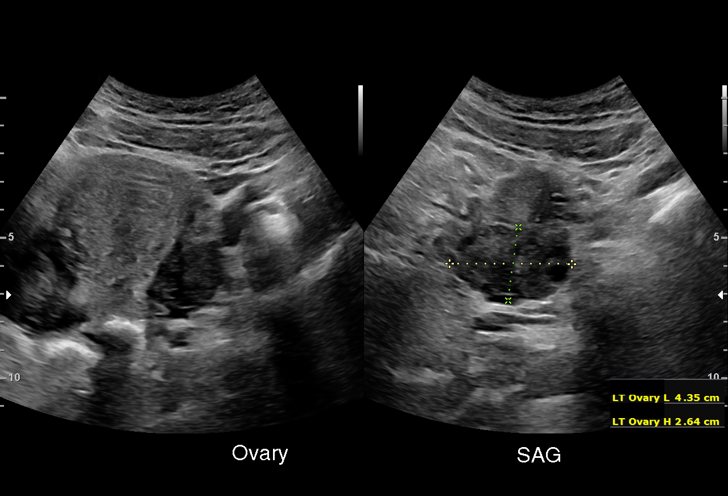
[im 13/42]
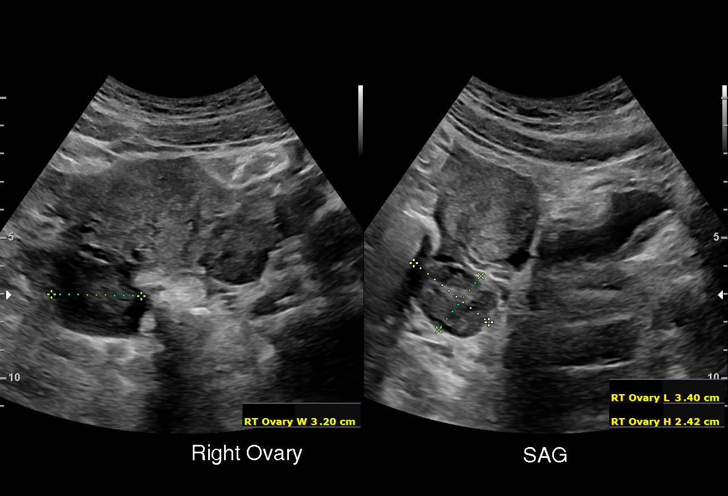
[im 16/42]
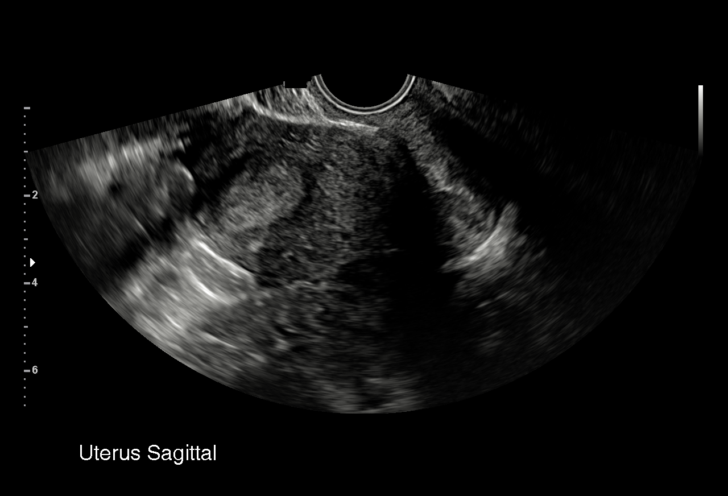
[im 19/42]
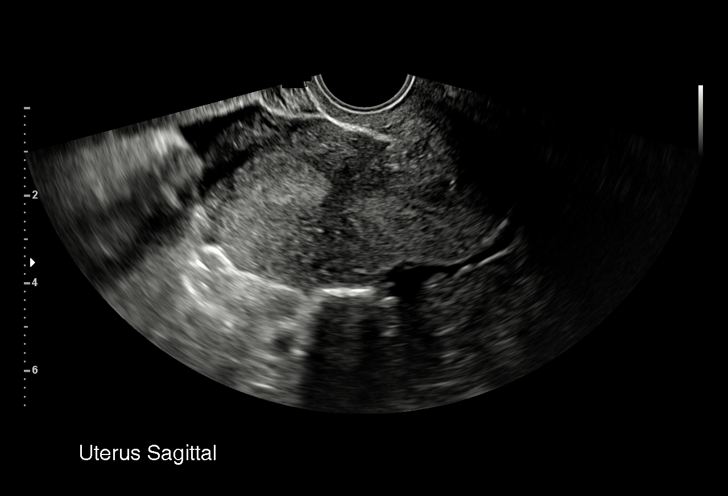
[im 22/42]
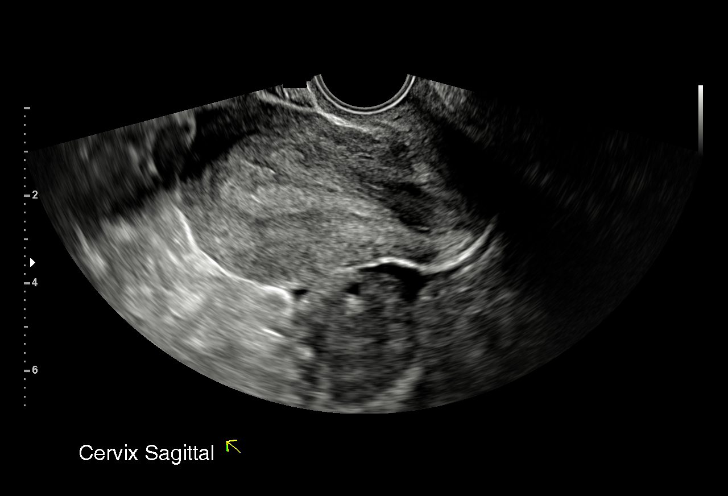
[im 23/42]
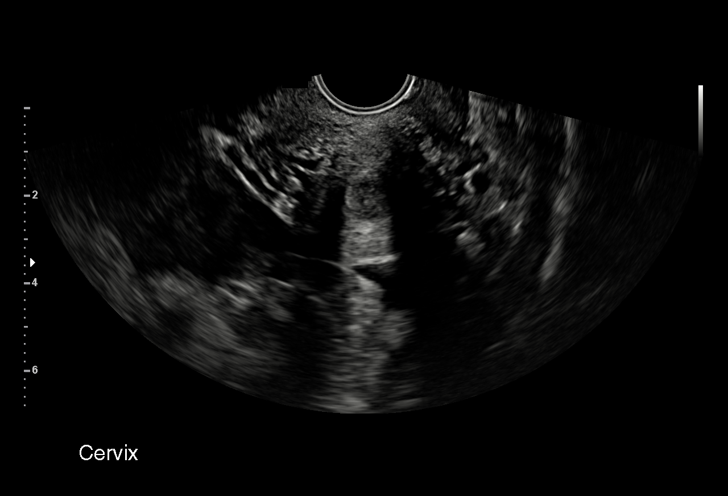
[im 26/42]
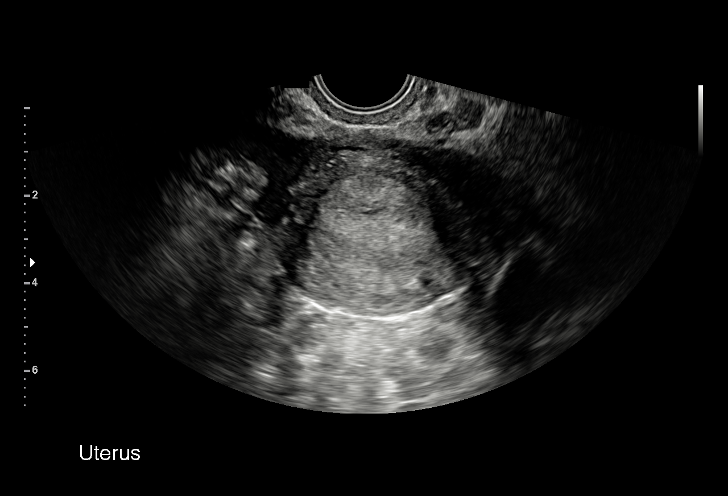
[im 29/42]
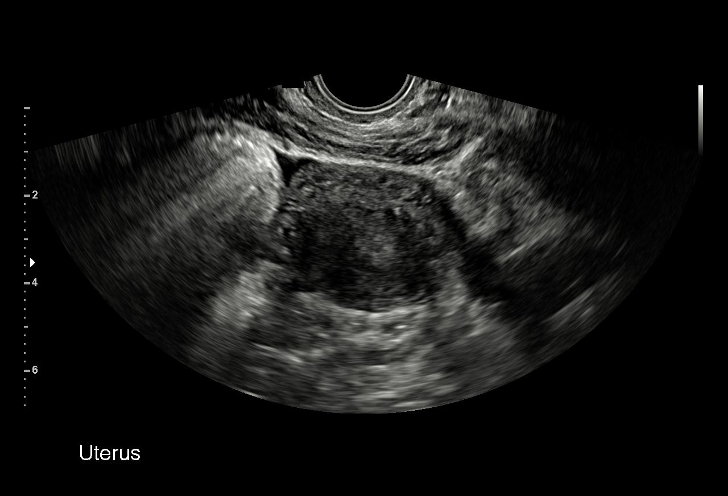
[im 32/42]
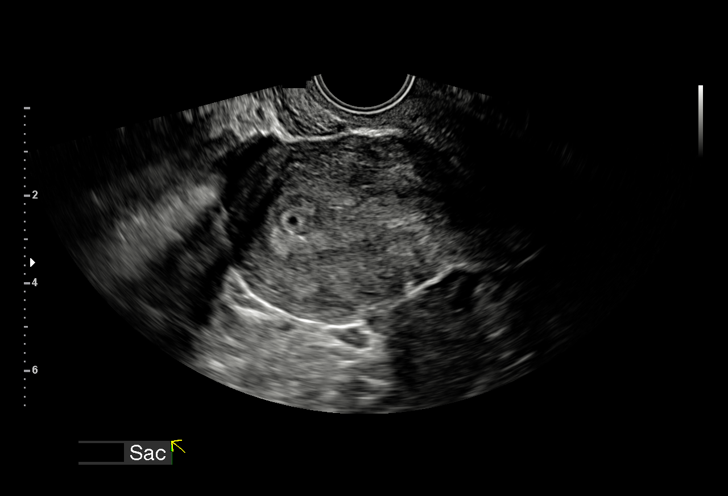
[im 35/42]
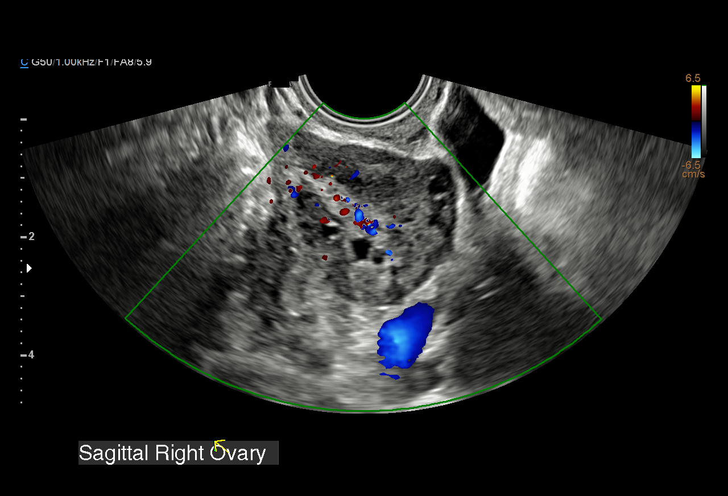
[im 38/42]
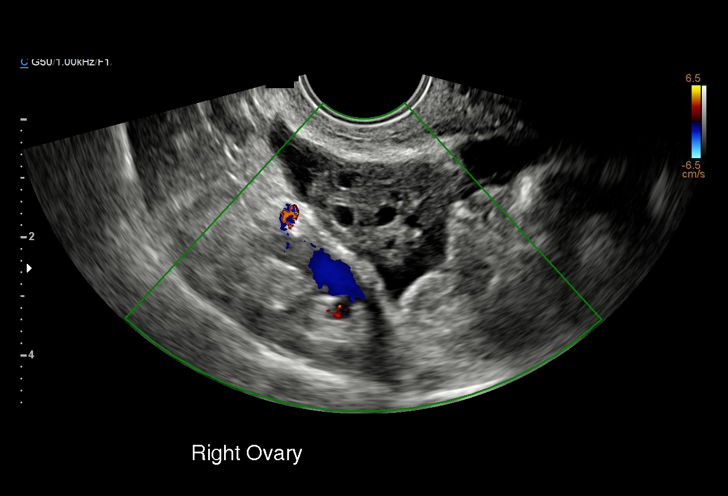
[im 42/42]
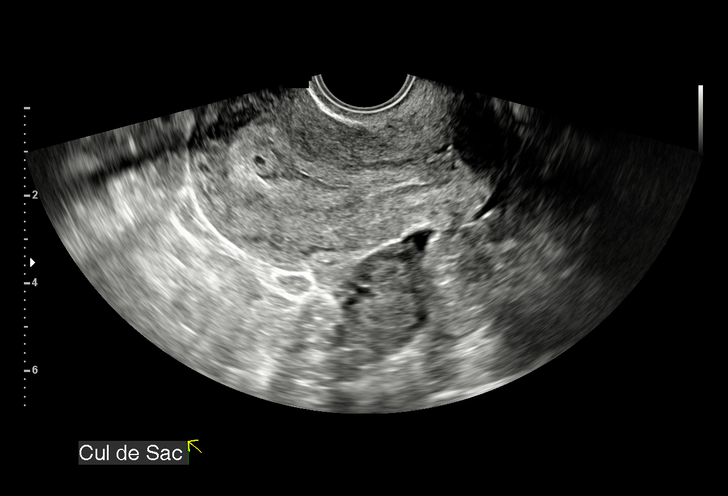

[15 of 28 positions shown; findings below may reference images not displayed]

FINDINGS: Intrauterine gestational sac: Single

Yolk sac:  Not Visualized.

Embryo:  Not Visualized.

MSD: 2.5 mm   4 w   6 d

Subchorionic hemorrhage:  None visualized.

Maternal uterus/adnexae: Both ovaries are normal in appearance. No
mass identified. Tiny amount of simple free fluid noted.
IMPRESSION: Single early approximately 5 week intrauterine gestational sac.
Suggest correlation with serial b-hCG levels, and consider followup
ultrasound to assess viability in 14 days.

No adnexal mass identified.

## 2020-06-13 ENCOUNTER — Other Ambulatory Visit: Payer: Self-pay

## 2020-06-13 ENCOUNTER — Encounter (HOSPITAL_COMMUNITY): Payer: Self-pay | Admitting: Obstetrics & Gynecology

## 2020-06-13 ENCOUNTER — Inpatient Hospital Stay (HOSPITAL_COMMUNITY)
Admission: AD | Admit: 2020-06-13 | Discharge: 2020-06-13 | Disposition: A | Discharge status: Home / Self Care | Payer: Medicaid Other | Attending: Obstetrics & Gynecology | Admitting: Obstetrics & Gynecology

## 2020-06-13 DIAGNOSIS — R519 Headache, unspecified: Secondary | ICD-10-CM | POA: Diagnosis not present

## 2020-06-13 DIAGNOSIS — Z79899 Other long term (current) drug therapy: Secondary | ICD-10-CM | POA: Diagnosis not present

## 2020-06-13 DIAGNOSIS — O26892 Other specified pregnancy related conditions, second trimester: Secondary | ICD-10-CM | POA: Diagnosis not present

## 2020-06-13 DIAGNOSIS — M79604 Pain in right leg: Secondary | ICD-10-CM | POA: Diagnosis not present

## 2020-06-13 DIAGNOSIS — R109 Unspecified abdominal pain: Secondary | ICD-10-CM | POA: Insufficient documentation

## 2020-06-13 DIAGNOSIS — O219 Vomiting of pregnancy, unspecified: Secondary | ICD-10-CM | POA: Insufficient documentation

## 2020-06-13 DIAGNOSIS — Z3A17 17 weeks gestation of pregnancy: Secondary | ICD-10-CM | POA: Insufficient documentation

## 2020-06-13 DIAGNOSIS — O3432 Maternal care for cervical incompetence, second trimester: Secondary | ICD-10-CM | POA: Insufficient documentation

## 2020-06-13 LAB — URINALYSIS, ROUTINE W REFLEX MICROSCOPIC
Bilirubin Urine: NEGATIVE
Glucose, UA: 150 mg/dL — AB
Ketones, ur: NEGATIVE mg/dL
Nitrite: NEGATIVE
Protein, ur: NEGATIVE mg/dL
Specific Gravity, Urine: 1.03 (ref 1.005–1.030)
pH: 5 (ref 5.0–8.0)

## 2020-06-13 LAB — WET PREP, GENITAL
Clue Cells Wet Prep HPF POC: NONE SEEN
Sperm: NONE SEEN
Trich, Wet Prep: NONE SEEN
Yeast Wet Prep HPF POC: NONE SEEN

## 2020-06-13 MED ORDER — PANTOPRAZOLE SODIUM 40 MG IV SOLR
40.0000 mg | Freq: Once | INTRAVENOUS | Status: AC
  Administered 2020-06-13: 40 mg via INTRAVENOUS
  Filled 2020-06-13: qty 40

## 2020-06-13 MED ORDER — LACTATED RINGERS IV BOLUS
1000.0000 mL | Freq: Once | INTRAVENOUS | Status: AC
  Administered 2020-06-13: 1000 mL via INTRAVENOUS

## 2020-06-13 MED ORDER — PANTOPRAZOLE SODIUM 20 MG PO TBEC: 20.0000 mg | DELAYED_RELEASE_TABLET | Freq: Every day | ORAL | 0 refills | Status: AC

## 2020-06-13 MED ORDER — SODIUM CHLORIDE 0.9 % IV SOLN
8.0000 mg | Freq: Once | INTRAVENOUS | Status: AC
  Administered 2020-06-13: 8 mg via INTRAVENOUS
  Filled 2020-06-13: qty 4

## 2020-06-13 NOTE — MAU Provider Note (Addendum)
History     CSN: [id number]  Arrival date and time: 06/13/20 1703   First Provider Initiated Contact with Patient 06/13/20 1929      Chief Complaint  Patient presents with  . Abdominal Pain  . Headache  . Emesis   Brenda West is a 34 y.o. G2P0010 at 95w4dwho [facility]  She presents today for Abdominal Pain, Headache, and Emesis.   Upon provider entrance patient gives extensive pregnancy history and dissatisfaction with care with current and past pregnancy. When redirected, by provider, on what she would like to focus on in regards to her care today patient states: *Nausea/Dehydration *Vaginal Pressure *Leg Pain  Patient states she has been only able to chew on ice and can not tolerate a full cup of water.  Patient reports she throws up everytime she eats. She reports she is "scared to eat or drink because I feel like I am going to throw it up."  She states she takes medication, but feels "like it doesn't help especially if I take it to late."  She reports taking zofran and phenergan suppository daily with last dose of zofran this morning around 11am. She last vomited at 1550.  She reports she had a cerclage placed on Oct 28th and has been experiencing vaginal pressure.  She also reports pain and "fluid build up" on her right leg that causes some difficulty with ambulation.  She states these symptoms have been on and off. She states she takes medication for acid reflux, but ran out of her prescription.      OB History    Gravida  2   Para  0   Term  0   Preterm  0   AB  1   Living  0     SAB  1   TAB  0   Ectopic  0   Multiple  0   Live Births              Past Medical History:  Diagnosis Date  . Allergy   . Anxiety   . Breast mass   . Bronchitis   . BV (bacterial vaginosis)   . Depression    doing ok  . Eye irritation   . Headache   . Infection    UTI  . Seasonal allergies   . Tooth abscess   . Vaginal Pap smear, abnormal    . Vaginal yeast infection   . Yeast infection     Past Surgical History:  Procedure Laterality Date  . HIP SURGERY    . WISDOM TOOTH EXTRACTION      Family History  Problem Relation Age of Onset  . Diabetes Father   . Cancer Father   . Hypertension Sister   . Diabetes Sister   . Glaucoma Brother   . Diabetes Brother   . Hypertension Brother     Social History   Tobacco Use  . Smoking status: Never Smoker  . Smokeless tobacco: Never Used  Vaping Use  . Vaping Use: Never used  Substance Use Topics  . Alcohol use: Not Currently    Comment: Occasional  . Drug use: Not Currently    Types: Marijuana    Allergies: No Known Allergies  Medications Prior to Admission  Medication Sig Dispense Refill Last Dose  . famotidine (PEPCID) 10 MG tablet Take 5 mg by mouth 2 (two) times daily.   06/13/2020 at Unknown time  . ondansetron (ZOFRAN ODT) 4 MG  disintegrating tablet Take 1 tablet (4 mg total) by mouth every 8 (eight) hours as needed for nausea or vomiting. 20 tablet 1 06/13/2020 at Unknown time  . polycarbophil (FIBERCON) 625 MG tablet Take 1 tablet (625 mg total) by mouth daily. 30 tablet 3 Past Month at Unknown time  . polyethylene glycol powder (GLYCOLAX/MIRALAX) 17 GM/SCOOP powder Take 17 g by mouth in the morning and at bedtime. Twice a day until normal BM then once daily 765 g 0 Past Month at Unknown time  . Prenatal MV-Min-FA-Omega-3 (PRENATAL GUMMIES/DHA & FA) 0.4-32.5 MG CHEW Chew 1 tablet by mouth daily. 90 tablet 1 06/13/2020 at Unknown time  . promethazine (PHENERGAN) 25 MG tablet Take 1 tablet (25 mg total) by mouth every 6 (six) hours as needed for nausea or vomiting. 30 tablet 1 06/12/2020 at Unknown time  . Blood Pressure Monitoring KIT 1 kit by Does not apply route once a week. 1 kit 0   . miconazole (MICOTIN) 200 MG vaginal suppository Place 1 suppository (200 mg total) vaginally at bedtime. 3 suppository 0   . nystatin cream (MYCOSTATIN) Apply to affected area 2  times daily 30 g 0   . scopolamine (TRANSDERM-SCOP, 1.5 MG,) 1 MG/3DAYS Place 1 patch (1.5 mg total) onto the skin every 3 (three) days. 10 patch 3   . triamcinolone cream (KENALOG) 0.1 % Apply 1 application topically 2 (two) times daily. 80 g 0     Review of Systems  Constitutional: Negative for chills and fever.  Respiratory: Negative for cough and shortness of breath.   Gastrointestinal: Positive for constipation and nausea. Negative for diarrhea and vomiting.  Genitourinary: Positive for vaginal discharge and vaginal pain (Pressure). Negative for difficulty urinating, dysuria and vaginal bleeding.  Neurological: Positive for headaches (7-8/10). Negative for dizziness and light-headedness.   Physical Exam   Blood pressure 126/77, pulse 100, temperature 98.8 F (37.1 C), resp. rate 17, weight 95.4 kg, last menstrual period 02/08/2020.  Physical Exam Vitals and nursing note reviewed. Exam conducted with a chaperone present.  Constitutional:      Appearance: Normal appearance. She is well-developed.  HENT:     Head: Normocephalic and atraumatic.  Eyes:     Conjunctiva/sclera: Conjunctivae normal.  Cardiovascular:     Rate and Rhythm: Normal rate and regular rhythm.  Pulmonary:     Effort: Pulmonary effort is normal.     Breath sounds: Normal breath sounds.  Abdominal:     Tenderness: There is no abdominal tenderness.  Genitourinary:    General: Normal vulva.     Comments: Speculum Exam: -Normal External Genitalia: Non tender, white discharge noted at introitus and within gluteal folds.  -Vaginal Vault: Pink mucosa with good rugae. Moderate amt grayish white discharge -wet prep collected -Cervix:Pink, no lesions, cysts, or polyps.  Appears closed. No active bleeding from os -Bimanual Exam:  Deferred   Musculoskeletal:        General: No tenderness. Normal range of motion.     Right lower leg: No edema.     Left lower leg: No edema.  Skin:    General: Skin is warm and dry.   Neurological:     Mental Status: She is alert and oriented to person, place, and time.  Psychiatric:        Mood and Affect: Mood normal.        Behavior: Behavior normal.        Thought Content: Thought content normal.     MAU Course  Procedures  Results for orders placed or performed during the hospital encounter of 06/13/20 (from the past 24 hour(s))  Urinalysis, Routine w reflex microscopic Urine, Clean Catch     Status: Abnormal   Collection Time: 06/13/20  6:26 PM  Result Value Ref Range   Color, Urine YELLOW YELLOW   APPearance HAZY (A) CLEAR   Specific Gravity, Urine 1.030 1.005 - 1.030   pH 5.0 5.0 - 8.0   Glucose, UA 150 (A) NEGATIVE mg/dL   Hgb urine dipstick MODERATE (A) NEGATIVE   Bilirubin Urine NEGATIVE NEGATIVE   Ketones, ur NEGATIVE NEGATIVE mg/dL   Protein, ur NEGATIVE NEGATIVE mg/dL   Nitrite NEGATIVE NEGATIVE   Leukocytes,Ua SMALL (A) NEGATIVE   RBC / HPF 6-10 0 - 5 RBC/hpf   WBC, UA 6-10 0 - 5 WBC/hpf   Bacteria, UA RARE (A) NONE SEEN   Squamous Epithelial / LPF 21-50 0 - 5   Mucus PRESENT    Ca Oxalate Crys, UA PRESENT   Wet prep, genital     Status: Abnormal   Collection Time: 06/13/20  8:01 PM  Result Value Ref Range   Yeast Wet Prep HPF POC NONE SEEN NONE SEEN   Trich, Wet Prep NONE SEEN NONE SEEN   Clue Cells Wet Prep HPF POC NONE SEEN NONE SEEN   WBC, Wet Prep HPF POC MANY (A) NONE SEEN   Sperm NONE SEEN     MDM Start IV LR Bolus Antiemetic PPI Assessment and Plan  34 year old, G2P0010  SIUP at 75.4weeks Nausea Leg Pain Pelvic Pressure  -Reviewed POC with patient. -Exam performed and findings discussed.  -Reassured that leg size is as anticipated.  Discussed body asymmetry and expectation of right leg being larger considering history. -Informed that she is not dehydrated, but will start IV and give fluids. -Will give Zofran IV and Protonix. -Discussed usage of Diclegis for ongoing N/V and patient states she can take  pills. -Report given to L.Leftwich-Kirby, CNM  Brenda West 06/13/2020, 7:29 PM   MDM: Pt symptoms improved after IV fluids and medications.    A/P: 1. Nausea and vomiting during pregnancy prior to [redacted] weeks gestation   2. Headache in pregnancy, antepartum, second trimester   3. [redacted] weeks gestation of pregnancy   4. Cervical cerclage suture present in second trimester    D/C home with precautions.  F/U with provider at [facility]. Return as needed for emergencies.  Fatima Blank, CNM 9:56 PM

## 2020-06-13 NOTE — MAU Note (Signed)
Patient reports abdominal cramping, a bad HA and vomiting that is not responding to her meds (odt zofran, pantoprazole, famotidine).  Also reports vaginal/pelvic pressure.  States her symptoms have been going on for 4 days.  Denies any VB. States she has a cerclage in place-gets her care at [facility] but lives here.

## 2020-07-20 ENCOUNTER — Other Ambulatory Visit: Payer: Self-pay | Admitting: Family Medicine

## 2021-01-11 ENCOUNTER — Other Ambulatory Visit (HOSPITAL_COMMUNITY): Payer: Self-pay

## 2021-05-24 ENCOUNTER — Ambulatory Visit (HOSPITAL_COMMUNITY): Admit: 2021-05-24 | Discharge status: Against Medical Advice | Payer: Medicaid Other

## 2021-05-25 ENCOUNTER — Encounter: Payer: Self-pay | Admitting: Emergency Medicine

## 2021-05-25 ENCOUNTER — Ambulatory Visit
Admission: EM | Admit: 2021-05-25 | Discharge: 2021-05-25 | Disposition: A | Discharge status: Home / Self Care | Payer: Medicaid Other | Attending: Internal Medicine | Admitting: Internal Medicine

## 2021-05-25 ENCOUNTER — Other Ambulatory Visit: Payer: Self-pay

## 2021-05-25 DIAGNOSIS — M79604 Pain in right leg: Secondary | ICD-10-CM | POA: Diagnosis not present

## 2021-05-25 MED ORDER — CYCLOBENZAPRINE HCL 5 MG PO TABS: 5.0000 mg | ORAL_TABLET | Freq: Two times a day (BID) | ORAL | 0 refills | Status: DC | PRN
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
## 2021-07-07 ENCOUNTER — Emergency Department (HOSPITAL_COMMUNITY): Payer: Medicaid Other

## 2021-07-07 ENCOUNTER — Emergency Department (HOSPITAL_COMMUNITY)
Admission: EM | Admit: 2021-07-07 | Discharge: 2021-07-08 | Disposition: A | Discharge status: Home / Self Care | Payer: Medicaid Other | Attending: Emergency Medicine | Admitting: Emergency Medicine

## 2021-07-07 DIAGNOSIS — R519 Headache, unspecified: Secondary | ICD-10-CM | POA: Diagnosis not present

## 2021-07-07 DIAGNOSIS — M542 Cervicalgia: Secondary | ICD-10-CM | POA: Insufficient documentation

## 2021-07-07 DIAGNOSIS — R079 Chest pain, unspecified: Secondary | ICD-10-CM | POA: Insufficient documentation

## 2021-07-07 DIAGNOSIS — M79641 Pain in right hand: Secondary | ICD-10-CM

## 2021-07-07 DIAGNOSIS — S60931A Unspecified superficial injury of right thumb, initial encounter: Secondary | ICD-10-CM | POA: Diagnosis present

## 2021-07-07 DIAGNOSIS — Y92481 Parking lot as the place of occurrence of the external cause: Secondary | ICD-10-CM | POA: Insufficient documentation

## 2021-07-07 DIAGNOSIS — R1030 Lower abdominal pain, unspecified: Secondary | ICD-10-CM | POA: Diagnosis not present

## 2021-07-07 DIAGNOSIS — S60311A Abrasion of right thumb, initial encounter: Secondary | ICD-10-CM | POA: Insufficient documentation

## 2021-07-07 DIAGNOSIS — M25562 Pain in left knee: Secondary | ICD-10-CM | POA: Diagnosis not present

## 2021-07-07 DIAGNOSIS — R52 Pain, unspecified: Secondary | ICD-10-CM

## 2021-07-07 LAB — CBC
HCT: 36.1 % (ref 36.0–46.0)
Hemoglobin: 11.3 g/dL — ABNORMAL LOW (ref 12.0–15.0)
MCH: 25.9 pg — ABNORMAL LOW (ref 26.0–34.0)
MCHC: 31.3 g/dL (ref 30.0–36.0)
MCV: 82.8 fL (ref 80.0–100.0)
Platelets: 362 10*3/uL (ref 150–400)
RBC: 4.36 MIL/uL (ref 3.87–5.11)
RDW: 14.1 % (ref 11.5–15.5)
WBC: 9.2 10*3/uL (ref 4.0–10.5)
nRBC: 0 % (ref 0.0–0.2)

## 2021-07-07 LAB — I-STAT BETA HCG BLOOD, ED (MC, WL, AP ONLY): I-stat hCG, quantitative: <5 m[IU]/mL (ref <5)

## 2021-07-07 LAB — PROTIME-INR
INR: 1 (ref 0.8–1.2)
Prothrombin Time: 12.7 seconds (ref 11.4–15.2)

## 2021-07-07 LAB — ETHANOL: Alcohol, Ethyl (B): <10 mg/dL (ref <10)

## 2021-07-07 MED ORDER — FENTANYL CITRATE PF 50 MCG/ML IJ SOSY
50.0000 ug | PREFILLED_SYRINGE | Freq: Once | INTRAMUSCULAR | Status: AC
  Administered 2021-07-08: 50 ug via INTRAVENOUS
  Filled 2021-07-07: qty 1
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
## 2021-07-07 NOTE — ED Notes (Signed)
Assumed care by previous RN.
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
## 2021-07-08 ENCOUNTER — Emergency Department (HOSPITAL_COMMUNITY): Payer: Medicaid Other

## 2021-07-08 LAB — COMPREHENSIVE METABOLIC PANEL
ALT: 13 U/L (ref 0–44)
AST: 16 U/L (ref 15–41)
Albumin: 3.7 g/dL (ref 3.5–5.0)
Alkaline Phosphatase: 45 U/L (ref 38–126)
Anion gap: 6 (ref 5–15)
BUN: 9 mg/dL (ref 6–20)
CO2: 25 mmol/L (ref 22–32)
Calcium: 8.9 mg/dL (ref 8.9–10.3)
Chloride: 106 mmol/L (ref 98–111)
Creatinine, Ser: 0.95 mg/dL (ref 0.44–1.00)
GFR, Estimated: >60 mL/min (ref >60)
Glucose, Bld: 94 mg/dL (ref 70–99)
Potassium: 3.5 mmol/L (ref 3.5–5.1)
Sodium: 137 mmol/L (ref 135–145)
Total Bilirubin: 0.3 mg/dL (ref 0.3–1.2)
Total Protein: 7.2 g/dL (ref 6.5–8.1)

## 2021-07-08 LAB — LACTIC ACID, PLASMA: Lactic Acid, Venous: 1 mmol/L (ref 0.5–1.9)

## 2021-07-08 MED ORDER — METHOCARBAMOL 500 MG PO TABS
1000.0000 mg | ORAL_TABLET | Freq: Once | ORAL | Status: AC
  Administered 2021-07-08: 1000 mg via ORAL
  Filled 2021-07-08: qty 2

## 2021-07-08 MED ORDER — FENTANYL CITRATE PF 50 MCG/ML IJ SOSY
50.0000 ug | PREFILLED_SYRINGE | Freq: Once | INTRAMUSCULAR | Status: AC
  Administered 2021-07-08: 50 ug via INTRAVENOUS
  Filled 2021-07-08: qty 1

## 2021-07-08 MED ORDER — IOHEXOL 300 MG/ML  SOLN
100.0000 mL | Freq: Once | INTRAMUSCULAR | Status: AC | PRN
  Administered 2021-07-08: 100 mL via INTRAVENOUS

## 2021-07-08 MED ORDER — METHOCARBAMOL 500 MG PO TABS: 500.0000 mg | ORAL_TABLET | Freq: Two times a day (BID) | ORAL | 0 refills | Status: DC

## 2021-07-08 MED ORDER — IBUPROFEN 800 MG PO TABS: 800.0000 mg | ORAL_TABLET | Freq: Three times a day (TID) | ORAL | 0 refills | Status: DC

## 2021-07-08 NOTE — ED Notes (Signed)
Pt was undressed completely for full assessment. Reports tenderness right upper chest, generalized abdominal pain and tenderness, pain right wrist, bilateral leg pain worse left leg with decreased ROM. Pt is A&Ox4, neck pain but refused c-collar. PA Jarrett Soho updated pt is undressed. Family and baby at bedside.

## 2021-07-08 NOTE — Discharge Instructions (Addendum)

## 2021-10-15 IMAGING — US US OB COMP LESS 14 WK
1 series · 15 of 28 positions shown · non-contrast
Comparison: None.

CLINICAL DATA: Pelvic pain, pregnant

EXAM:
OBSTETRIC <14 WK ULTRASOUND
TECHNIQUE: Transabdominal ultrasound was performed for evaluation of the
gestation as well as the maternal uterus and adnexal regions.

[Series 1: us ob comp less 14 wk · 40 acquisitions, 15 frames shown]
[im 1/40]
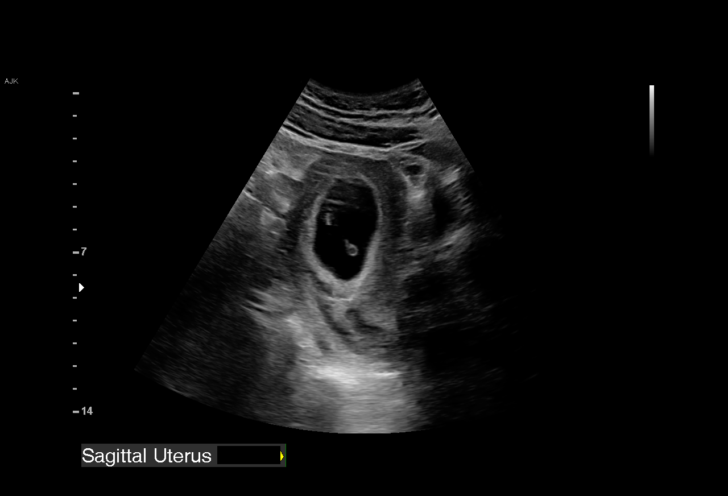
[im 3/40]
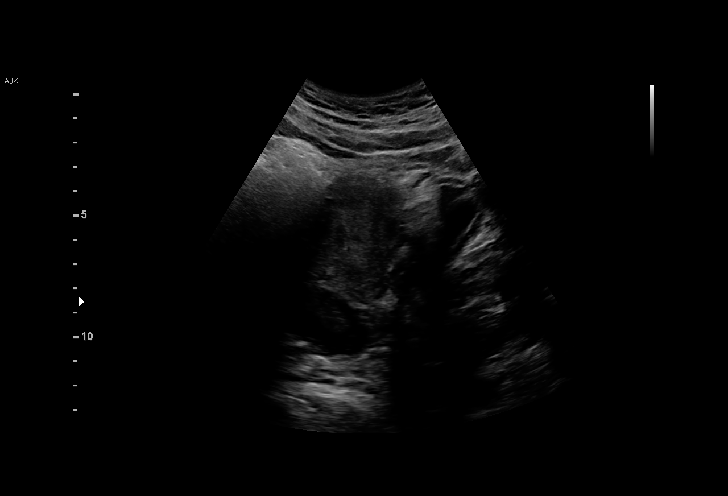
[im 6/40]
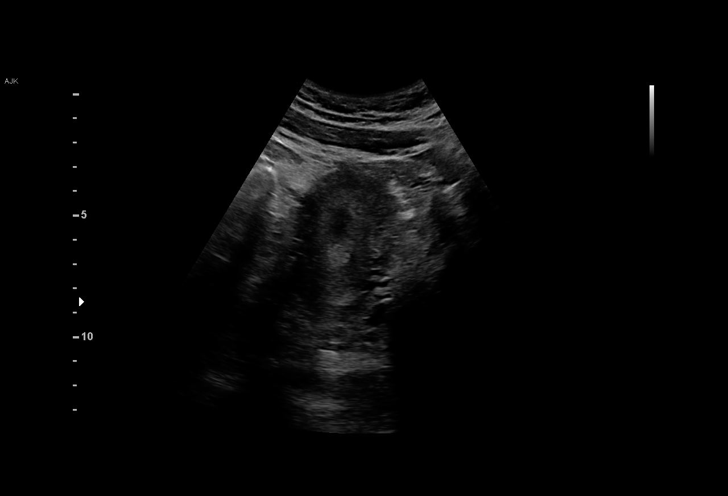
[im 9/40]
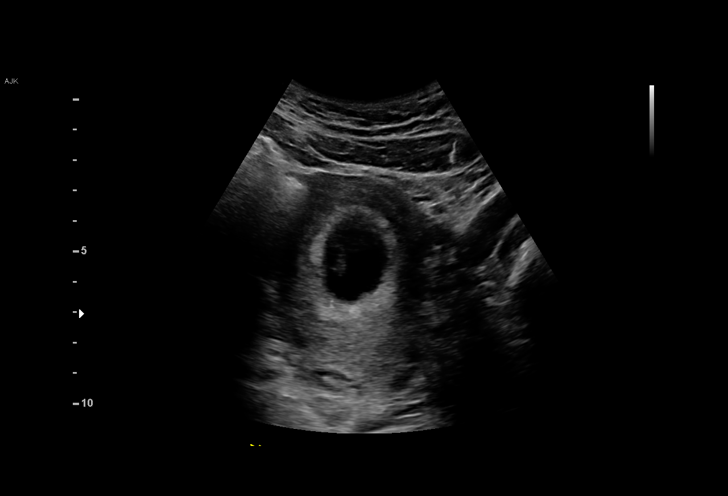
[im 12/40]
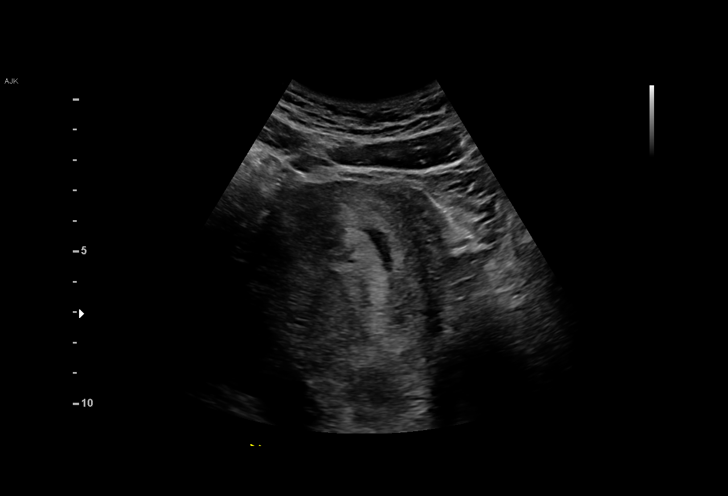
[im 15/40]
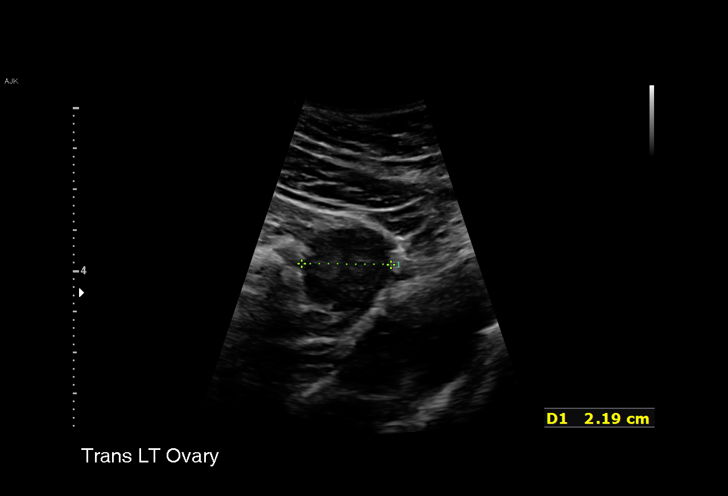
[im 18/40]
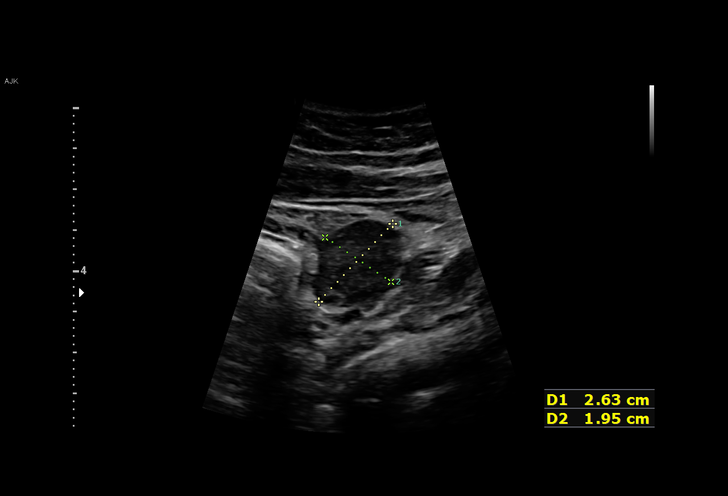
[im 21/40]
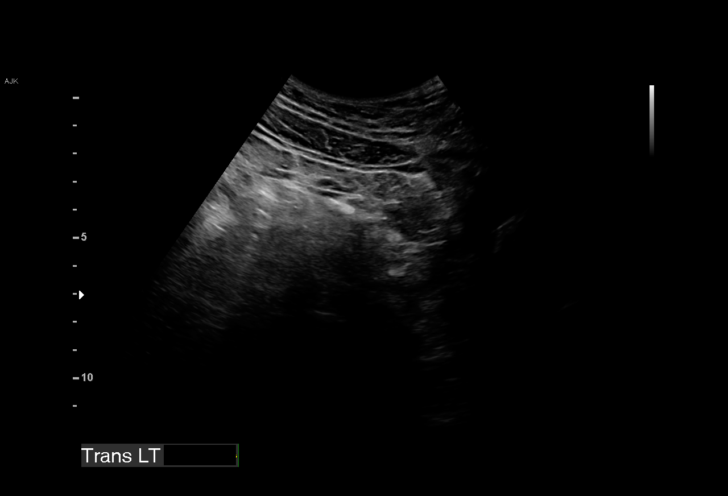
[im 22/40]
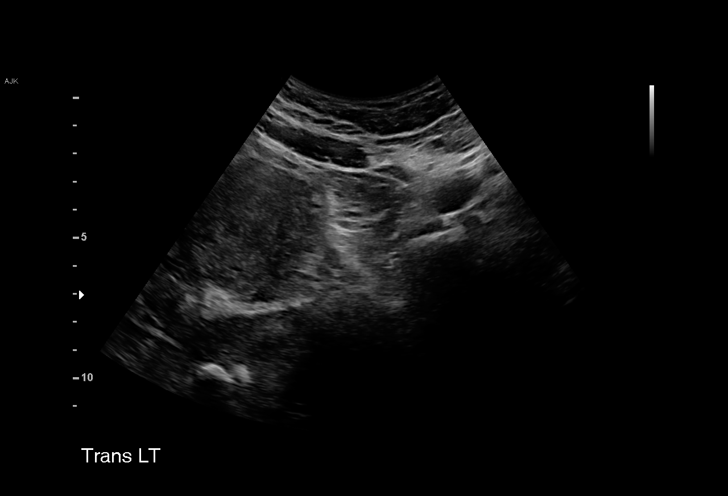
[im 25/40]
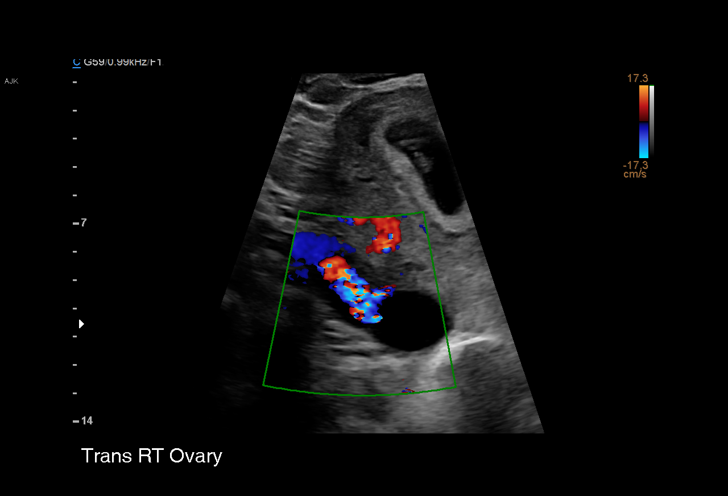
[im 28/40]
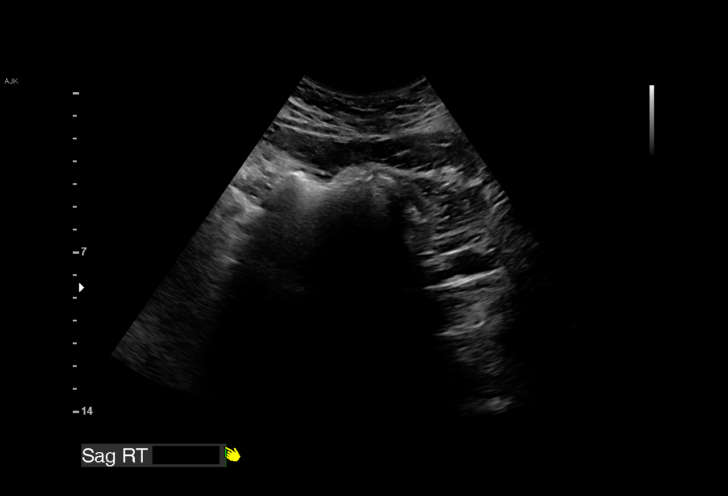
[im 31/40]
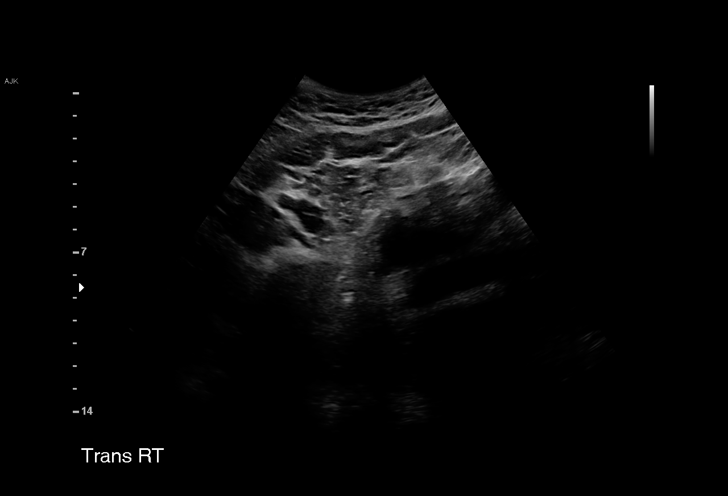
[im 34/40]
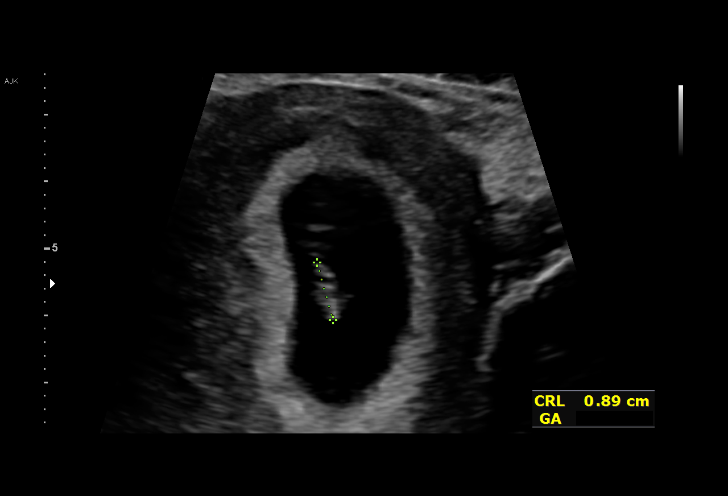
[im 37/40]
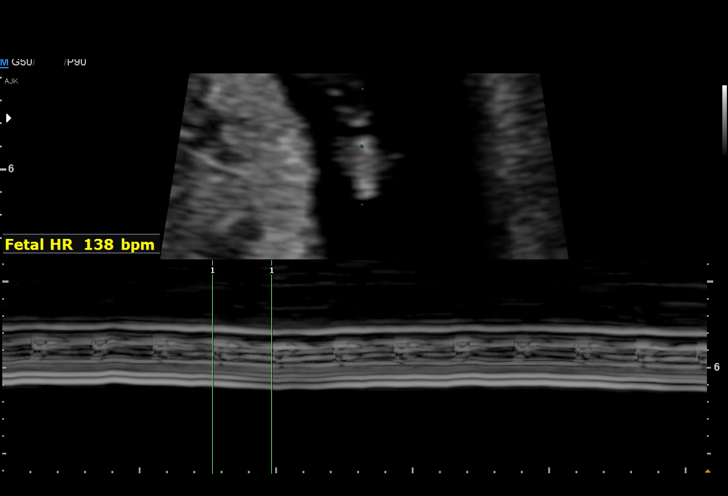
[im 40/40]
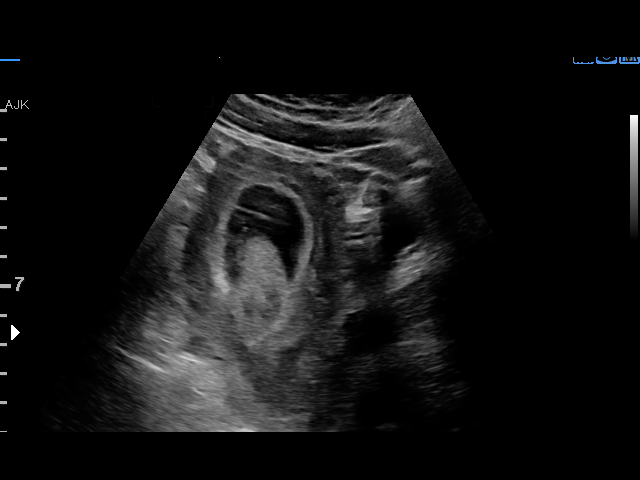

[15 of 28 positions shown; findings below may reference images not displayed]

FINDINGS: Intrauterine gestational sac: Present, single

Yolk sac:  Present, single, normal-appearing

Embryo:  Present, single

Cardiac Activity: Present, regular

Heart Rate: 138 bpm

MSD: Appropriate given fetal size

CRL:   9 mm   6 w 6 d                  US EDC: 11/17/2020

Subchorionic hemorrhage:  None visualized.

Maternal uterus/adnexae: The uterus is anteverted. No intrauterine
masses are seen. The cervix is not optimally visualized on this
examination due to transabdominal technique but appears closed and
unremarkable, best appreciated on cine images. There is no free
fluid within the pelvis. The maternal ovaries are normal in size and
echogenicity. Corpus luteum noted within the right ovary.
IMPRESSION: Single living intrauterine gestation with an estimated gestational
age of 6 weeks, 6 days.

## 2021-10-19 IMAGING — US US OB COMP LESS 14 WK
1 series · 15 of 28 positions shown · non-contrast
Comparison: 03/30/2020

CLINICAL DATA: Right lower quadrant pain. Estimated gestational age
by LMP is 7 weeks 6 days. Quantitative beta HCG is in progress and
not available.

EXAM:
OBSTETRIC <14 WK ULTRASOUND
TECHNIQUE: Transabdominal ultrasound was performed for evaluation of the
gestation as well as the maternal uterus and adnexal regions.

[Series 1: us ob comp less 14 wk · 40 acquisitions, 15 frames shown]
[im 1/40]
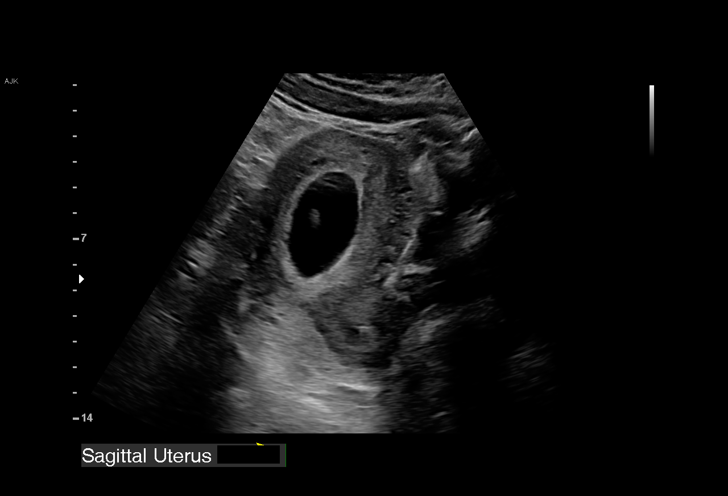
[im 3/40]
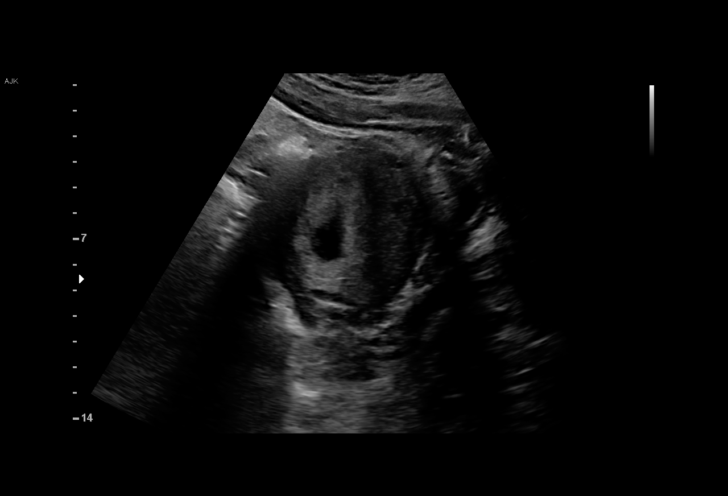
[im 6/40]
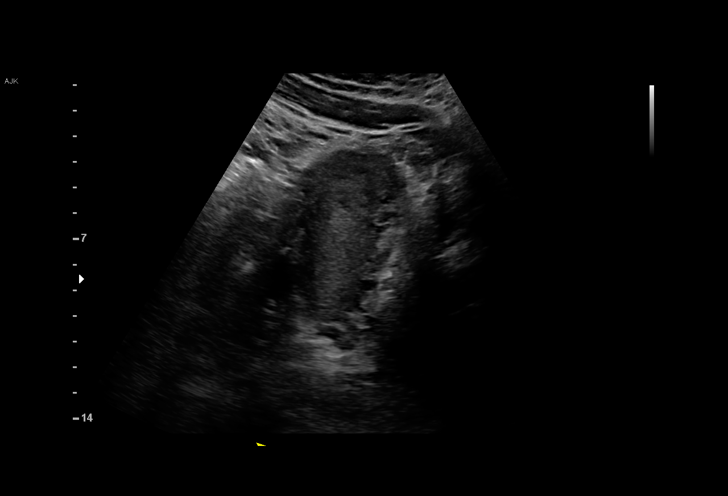
[im 9/40]
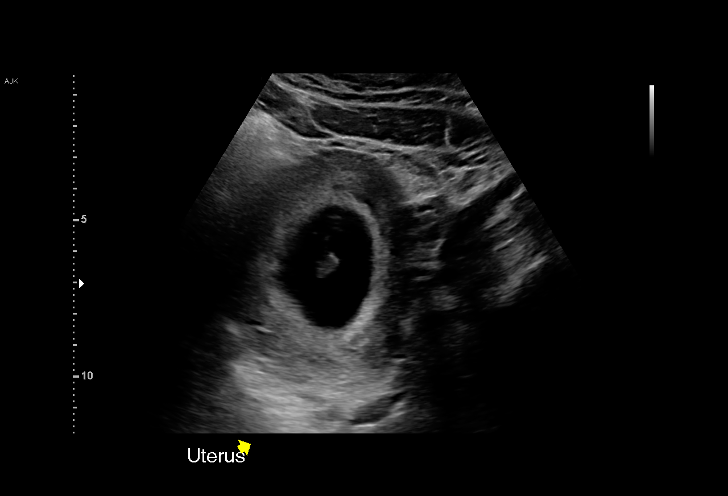
[im 12/40]
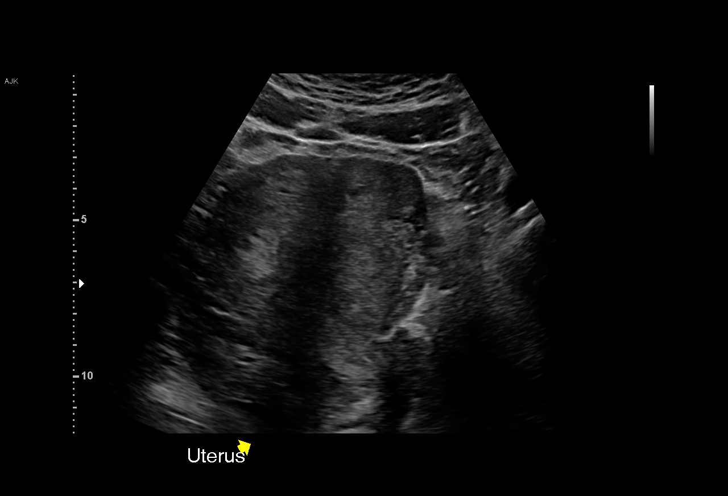
[im 15/40]
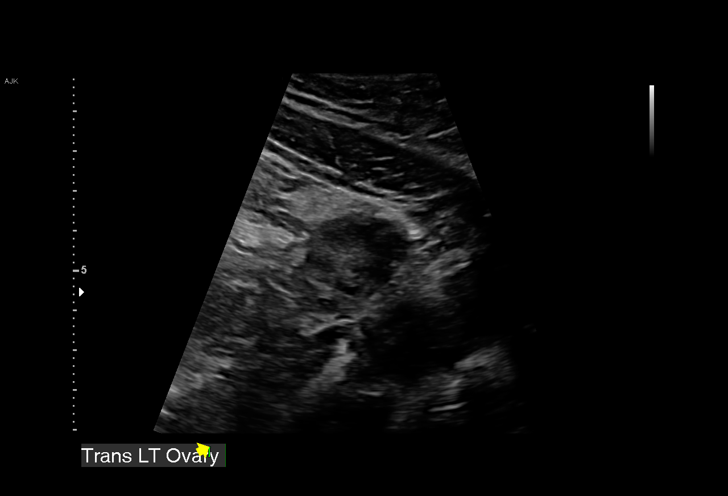
[im 18/40]
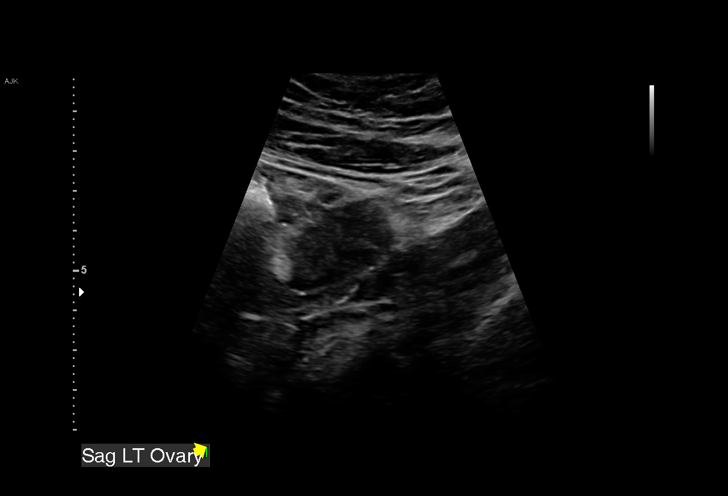
[im 21/40]
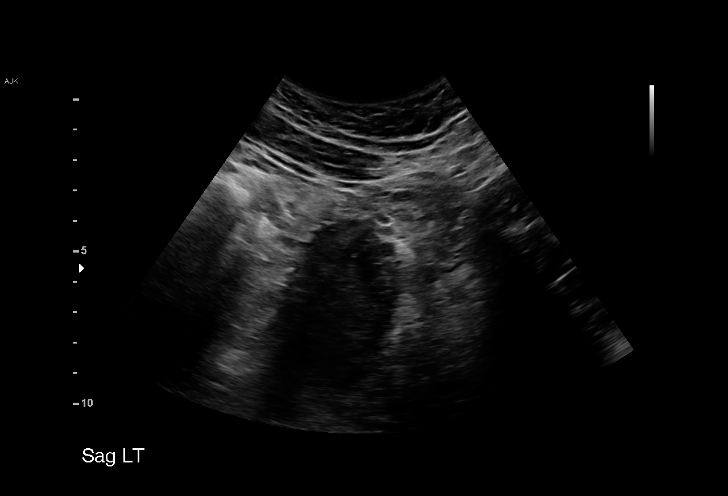
[im 22/40]
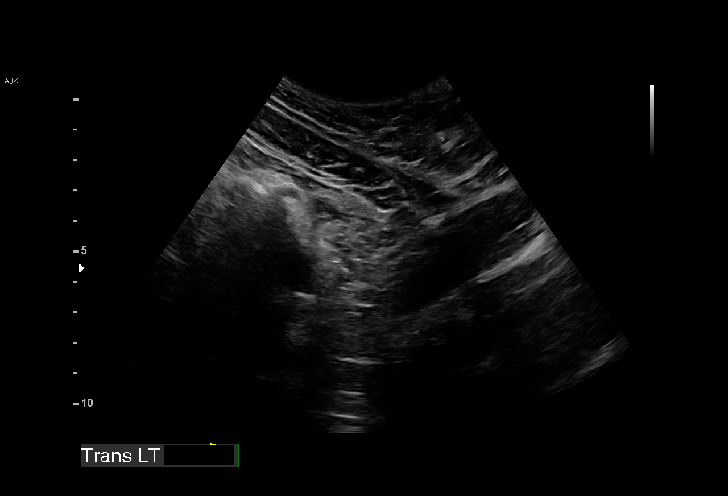
[im 25/40]
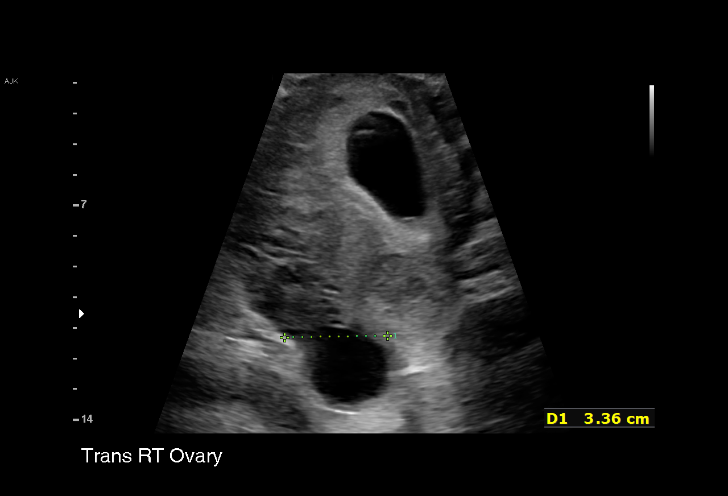
[im 28/40]
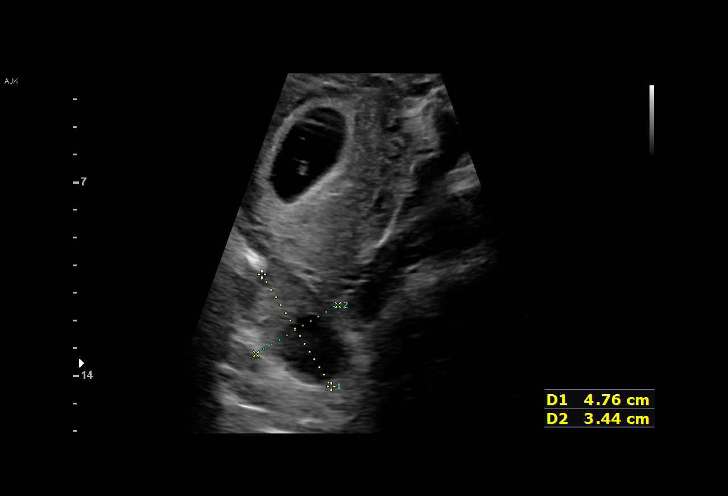
[im 31/40]
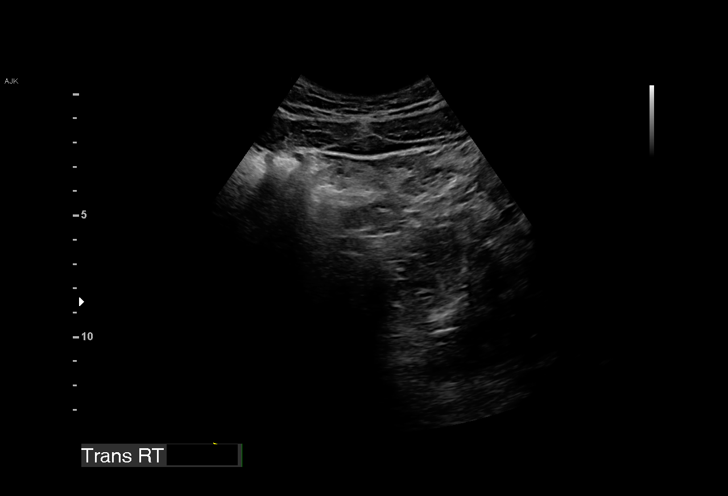
[im 34/40]
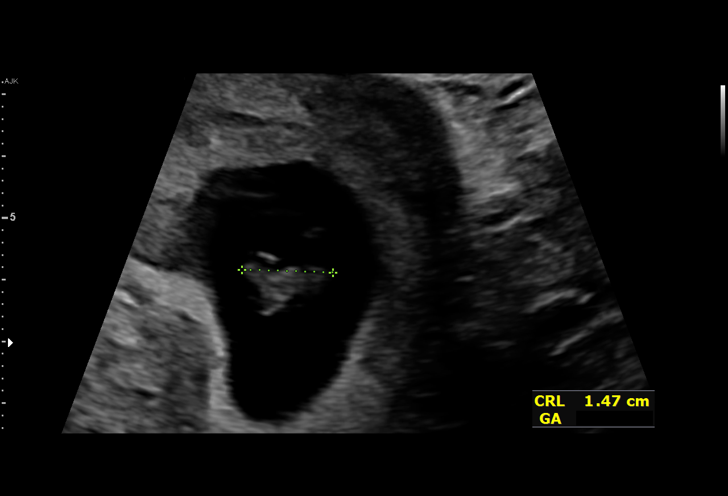
[im 37/40]
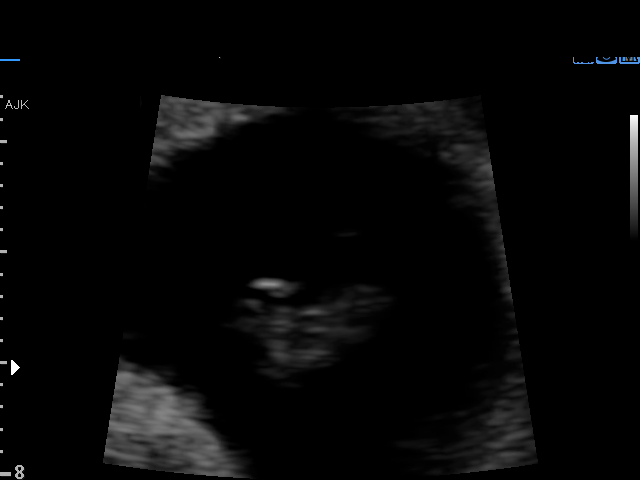
[im 40/40]
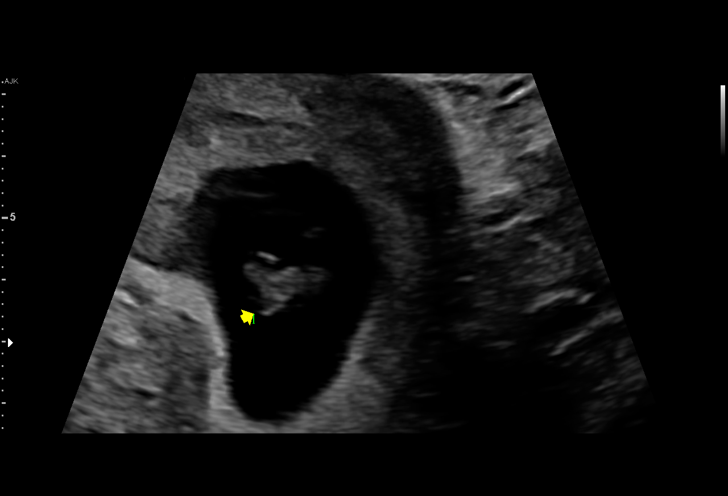

[15 of 28 positions shown; findings below may reference images not displayed]

FINDINGS: Intrauterine gestational sac: A single intrauterine gestational sac
is present.

Yolk sac:  The yolk sac is present.

Embryo:  Fetal pole is identified.

Cardiac Activity: Fetal cardiac activity is observed.

Heart Rate: 155 bpm

CRL: 14 mm   7 w 5 d                  US EDC: 11/15/2020

Subchorionic hemorrhage:  None visualized.

Maternal uterus/adnexae: The uterus is anteverted. No myometrial
mass lesions are identified. Both ovaries are visualized and appear
normal. Corpus luteal cyst on the right. No free fluid in the
pelvis.
IMPRESSION: Single intrauterine pregnancy. Estimated gestational age by
crown-rump length is 7 weeks 5 days. No acute complication is
demonstrated sonographically.

## 2022-07-14 ENCOUNTER — Emergency Department (HOSPITAL_BASED_OUTPATIENT_CLINIC_OR_DEPARTMENT_OTHER)
Admission: EM | Admit: 2022-07-14 | Discharge: 2022-07-14 | Disposition: A | Discharge status: Home / Self Care | Payer: Medicaid Other | Attending: Emergency Medicine | Admitting: Emergency Medicine

## 2022-07-14 ENCOUNTER — Encounter (HOSPITAL_BASED_OUTPATIENT_CLINIC_OR_DEPARTMENT_OTHER): Payer: Self-pay | Admitting: *Deleted

## 2022-07-14 ENCOUNTER — Other Ambulatory Visit: Payer: Self-pay

## 2022-07-14 DIAGNOSIS — Z20822 Contact with and (suspected) exposure to covid-19: Secondary | ICD-10-CM | POA: Insufficient documentation

## 2022-07-14 DIAGNOSIS — R059 Cough, unspecified: Secondary | ICD-10-CM | POA: Diagnosis present

## 2022-07-14 DIAGNOSIS — J101 Influenza due to other identified influenza virus with other respiratory manifestations: Secondary | ICD-10-CM | POA: Insufficient documentation

## 2022-07-14 LAB — URINALYSIS, ROUTINE W REFLEX MICROSCOPIC
Bilirubin Urine: NEGATIVE
Glucose, UA: NEGATIVE mg/dL
Ketones, ur: NEGATIVE mg/dL
Leukocytes,Ua: NEGATIVE
Nitrite: NEGATIVE
Specific Gravity, Urine: 1.038 — ABNORMAL HIGH (ref 1.005–1.030)
pH: 5.5 (ref 5.0–8.0)

## 2022-07-14 LAB — RESP PANEL BY RT-PCR (RSV, FLU A&B, COVID)  RVPGX2
Influenza A by PCR: POSITIVE — AB
Influenza B by PCR: NEGATIVE
Resp Syncytial Virus by PCR: NEGATIVE
SARS Coronavirus 2 by RT PCR: NEGATIVE

## 2022-07-14 LAB — PREGNANCY, URINE: Preg Test, Ur: NEGATIVE

## 2022-07-14 MED ORDER — OSELTAMIVIR PHOSPHATE 75 MG PO CAPS: 75.0000 mg | ORAL_CAPSULE | Freq: Two times a day (BID) | ORAL | 0 refills | Status: AC

## 2022-07-14 NOTE — ED Provider Notes (Signed)
[facility] EMERGENCY DEPT Provider Note   CSN: [id number] Arrival date & time: 07/14/22  0354     History  Chief Complaint  Patient presents with   Other    Multiple complaints     Brenda West is a 37 y.o. female.  HPI     This is a 37 year old female who presents with cough and chills.  Reports that her son has been treated for RSV.  She states the last several nights she is woken up with chills.  No documented fevers.  She has had a nonproductive cough.  Also reports congestion.  She has been taking leftover prednisone from when she had bronchitis.  Home Medications Prior to Admission medications   Medication Sig Start Date End Date Taking? Authorizing Provider  oseltamivir (TAMIFLU) 75 MG capsule Take 1 capsule (75 mg total) by mouth every 12 (twelve) hours. 07/14/22  Yes Cloy Cozzens, Mayer Masker, MD  Blood Pressure Monitoring KIT 1 kit by Does not apply route once a week. 01/13/19   Tamera Stands, DO  ibuprofen (ADVIL) 800 MG tablet Take 1 tablet (800 mg total) by mouth 3 (three) times daily. 07/08/21   Muthersbaugh, Dahlia Client, PA-C  miconazole (MICOTIN) 200 MG vaginal suppository Place 1 suppository (200 mg total) vaginally at bedtime. 05/16/20   Cathie Hoops, Amy V, PA-C  ondansetron (ZOFRAN ODT) 4 MG disintegrating tablet Take 1 tablet (4 mg total) by mouth every 8 (eight) hours as needed for nausea or vomiting. 04/16/20   Nugent, Odie Sera, NP  pantoprazole (PROTONIX) 20 MG tablet Take 1 tablet (20 mg total) by mouth daily. 06/13/20   Gerrit Heck, CNM      Allergies    Patient has no known allergies.    Review of Systems   Review of Systems  Constitutional:  Positive for chills. Negative for fever.  Respiratory:  Positive for cough. Negative for shortness of breath.   All other systems reviewed and are negative.   Physical Exam Updated Vital Signs BP 106/76   Pulse 84   Temp 98 F (36.7 C)   Resp 18   Ht 1.6 m (5\' 3" )   Wt 90.7 kg   LMP 07/01/2022  (Approximate)   SpO2 100%   BMI 35.43 kg/m  Physical Exam Vitals and nursing note reviewed.  Constitutional:      Appearance: She is well-developed. She is obese.  HENT:     Head: Normocephalic and atraumatic.  Eyes:     Pupils: Pupils are equal, round, and reactive to light.  Cardiovascular:     Rate and Rhythm: Normal rate and regular rhythm.     Heart sounds: Normal heart sounds.  Pulmonary:     Effort: Pulmonary effort is normal. No respiratory distress.     Breath sounds: No wheezing or rales.  Abdominal:     Palpations: Abdomen is soft.     Tenderness: There is no abdominal tenderness.  Musculoskeletal:     Cervical back: Neck supple.  Skin:    General: Skin is warm and dry.  Neurological:     Mental Status: She is alert and oriented to person, place, and time.  Psychiatric:        Mood and Affect: Mood normal.     ED Results / Procedures / Treatments   Labs (all labs ordered are listed, but only abnormal results are displayed) Labs Reviewed  RESP PANEL BY RT-PCR (RSV, FLU A&B, COVID)  RVPGX2 - Abnormal; Notable for the following components:  Result Value   Influenza A by PCR POSITIVE (*)    All other components within normal limits  URINALYSIS, ROUTINE W REFLEX MICROSCOPIC - Abnormal; Notable for the following components:   Specific Gravity, Urine 1.038 (*)    Hgb urine dipstick LARGE (*)    Protein, ur TRACE (*)    Bacteria, UA RARE (*)    All other components within normal limits  PREGNANCY, URINE    EKG None  Radiology No results found.  Procedures Procedures    Medications Ordered in ED Medications - No data to display  ED Course/ Medical Decision Making/ A&P                           Medical Decision Making Amount and/or Complexity of Data Reviewed Labs: ordered.  Risk Prescription drug management.   This patient presents to the ED for concern of chills, cough, this involves an extensive number of treatment options, and is a  complaint that carries with it a high risk of complications and morbidity.  I considered the following differential and admission for this acute, potentially life threatening condition.  The differential diagnosis includes viral illness such as COVID or influenza, bacterial pneumonia, UTI  MDM:    This is a 37 year old female who presents with chills and cough.  Son is sick as well.  She is nontoxic and afebrile.  Vital signs are reassuring.  She is in no respiratory distress.  Breath sounds are clear.  Urinalysis without evidence of UTI.  Viral testing positive for influenza.  This is likely the culprit.  She was offered Tamiflu.  Recommend supportive measures.  (Labs, imaging, consults)  Labs: I Ordered, and personally interpreted labs.  The pertinent results include: Urinalysis, urine pregnancy, COVID and influenza  Imaging Studies ordered: I ordered imaging studies including none I independently visualized and interpreted imaging. I agree with the radiologist interpretation  Additional history obtained from chart review.  External records from outside source obtained and reviewed including prior evaluations  Cardiac Monitoring: The patient was maintained on a cardiac monitor.  I personally viewed and interpreted the cardiac monitored which showed an underlying rhythm of: Sinus rhythm  Reevaluation: After the interventions noted above, I reevaluated the patient and found that they have :stayed the same  Social Determinants of Health:  lives independently  Disposition: Discharge  Co morbidities that complicate the patient evaluation  Past Medical History:  Diagnosis Date   Allergy    Anxiety    Breast mass    Bronchitis    BV (bacterial vaginosis)    Depression    doing ok   Eye irritation    Headache    Infection    UTI   Seasonal allergies    Tooth abscess    Vaginal Pap smear, abnormal    Vaginal yeast infection    Yeast infection      Medicines Meds ordered  this encounter  Medications   oseltamivir (TAMIFLU) 75 MG capsule    Sig: Take 1 capsule (75 mg total) by mouth every 12 (twelve) hours.    Dispense:  10 capsule    Refill:  0    I have reviewed the patients home medicines and have made adjustments as needed  Problem List / ED Course: Problem List Items Addressed This Visit   None Visit Diagnoses     Influenza A    -  Primary   Relevant Medications   oseltamivir (TAMIFLU)  75 MG capsule                   Final Clinical Impression(s) / ED Diagnoses Final diagnoses:  Influenza A    Rx / DC Orders ED Discharge Orders          Ordered    oseltamivir (TAMIFLU) 75 MG capsule  Every 12 hours        07/14/22 0606              Amari Burnsworth, Barbette Hair, MD 07/14/22 [phone]

## 2022-07-14 NOTE — Discharge Instructions (Signed)
You were seen today and tested positive for the flu.  You may take Tamiflu if you wish.  Make sure that you are staying hydrated.  Take Tylenol or ibuprofen for any body aches or pains.

## 2022-07-14 NOTE — ED Notes (Signed)
Pt refused to sign her MSE form.

## 2022-07-14 NOTE — ED Triage Notes (Addendum)
Pt c/o of cough for past week. Pt states her son currently has RSV. Also concerned that she has a yeast infection and a UTI. States "foul odor smell and dark yellow"  Fevers unknown. Has been taking "left over prednisone"

## 2023-04-18 ENCOUNTER — Other Ambulatory Visit: Payer: Self-pay

## 2023-04-18 ENCOUNTER — Emergency Department (HOSPITAL_BASED_OUTPATIENT_CLINIC_OR_DEPARTMENT_OTHER): Payer: Medicaid Other | Admitting: Radiology

## 2023-04-18 ENCOUNTER — Encounter (HOSPITAL_BASED_OUTPATIENT_CLINIC_OR_DEPARTMENT_OTHER): Payer: Self-pay | Admitting: Emergency Medicine

## 2023-04-18 ENCOUNTER — Emergency Department (HOSPITAL_BASED_OUTPATIENT_CLINIC_OR_DEPARTMENT_OTHER)
Admission: EM | Admit: 2023-04-18 | Discharge: 2023-04-18 | Disposition: A | Discharge status: Home / Self Care | Payer: Medicaid Other | Attending: Emergency Medicine | Admitting: Emergency Medicine

## 2023-04-18 DIAGNOSIS — M545 Low back pain, unspecified: Secondary | ICD-10-CM | POA: Insufficient documentation

## 2023-04-18 DIAGNOSIS — N898 Other specified noninflammatory disorders of vagina: Secondary | ICD-10-CM | POA: Diagnosis not present

## 2023-04-18 DIAGNOSIS — M542 Cervicalgia: Secondary | ICD-10-CM | POA: Diagnosis present

## 2023-04-18 DIAGNOSIS — Y92410 Unspecified street and highway as the place of occurrence of the external cause: Secondary | ICD-10-CM | POA: Diagnosis not present

## 2023-04-18 LAB — URINALYSIS, ROUTINE W REFLEX MICROSCOPIC
Bilirubin Urine: NEGATIVE
Glucose, UA: NEGATIVE mg/dL
Ketones, ur: NEGATIVE mg/dL
Nitrite: NEGATIVE
Specific Gravity, Urine: 1.031 — ABNORMAL HIGH (ref 1.005–1.030)
pH: 5.5 (ref 5.0–8.0)

## 2023-04-18 LAB — WET PREP, GENITAL
Sperm: NONE SEEN
Trich, Wet Prep: NONE SEEN
WBC, Wet Prep HPF POC: <10 (ref <10)
Yeast Wet Prep HPF POC: NONE SEEN

## 2023-04-18 LAB — PREGNANCY, URINE: Preg Test, Ur: NEGATIVE

## 2023-04-18 MED ORDER — OXYCODONE HCL 5 MG PO TABS
5.0000 mg | ORAL_TABLET | Freq: Four times a day (QID) | ORAL | 0 refills | Status: DC | PRN
Start: 2023-04-18 — End: 2023-04-18

## 2023-04-18 MED ORDER — IBUPROFEN 800 MG PO TABS
800.0000 mg | ORAL_TABLET | Freq: Once | ORAL | Status: DC
  Filled 2023-04-18: qty 1

## 2023-04-18 MED ORDER — IBUPROFEN 600 MG PO TABS: 600.0000 mg | ORAL_TABLET | Freq: Four times a day (QID) | ORAL | 0 refills | Status: DC | PRN

## 2023-04-18 MED ORDER — IBUPROFEN 600 MG PO TABS: 600.0000 mg | ORAL_TABLET | Freq: Four times a day (QID) | ORAL | 0 refills | Status: AC | PRN

## 2023-04-18 MED ORDER — IBUPROFEN 100 MG/5ML PO SUSP
800.0000 mg | Freq: Once | ORAL | Status: AC
  Administered 2023-04-18: 800 mg via ORAL
  Filled 2023-04-18: qty 40

## 2023-04-18 MED ORDER — OXYCODONE HCL 5 MG PO TABS
5.0000 mg | ORAL_TABLET | Freq: Four times a day (QID) | ORAL | 0 refills | Status: AC | PRN
Start: 2023-04-18 — End: 2023-04-21

## 2023-04-18 NOTE — ED Notes (Signed)
Reviewed AVS/discharge instruction with patient. Time allotted for and all questions answered. Patient is agreeable for d/c and escorted to ed exit by staff.  
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
## 2023-04-19 ENCOUNTER — Telehealth (HOSPITAL_BASED_OUTPATIENT_CLINIC_OR_DEPARTMENT_OTHER): Payer: Self-pay | Admitting: Emergency Medicine

## 2023-04-19 NOTE — Telephone Encounter (Signed)
Patient tested for BV yesterday but requested to leave prior to test results. Wet mount positive for BV. Call attempted but phone number listed is not in service. Called sister listed on contact list for patient's correct phone number but no answer.

## 2023-12-21 ENCOUNTER — Encounter (HOSPITAL_BASED_OUTPATIENT_CLINIC_OR_DEPARTMENT_OTHER): Payer: Self-pay

## 2023-12-21 ENCOUNTER — Other Ambulatory Visit: Payer: Self-pay

## 2023-12-21 ENCOUNTER — Emergency Department (HOSPITAL_BASED_OUTPATIENT_CLINIC_OR_DEPARTMENT_OTHER)
Admission: EM | Admit: 2023-12-21 | Discharge: 2023-12-21 | Discharge status: Against Medical Advice | Attending: Emergency Medicine | Admitting: Emergency Medicine

## 2023-12-21 DIAGNOSIS — Z5321 Procedure and treatment not carried out due to patient leaving prior to being seen by health care provider: Secondary | ICD-10-CM | POA: Insufficient documentation

## 2023-12-21 DIAGNOSIS — G43909 Migraine, unspecified, not intractable, without status migrainosus: Secondary | ICD-10-CM | POA: Insufficient documentation

## 2023-12-21 NOTE — ED Triage Notes (Signed)
 Pt c/o migraine HA onset last week, pressure over L eye onset this AM. Recent URI, sinus infection, completed abx.  Denies hx migraines  Familial hx glaucoma, want to make sure I know what's happening.
